# Patient Record
Sex: Female | Born: 1945 | Race: White | Hispanic: No | Marital: Married | Smoking: Former smoker
Health system: Southern US, Community
[De-identification: ages and names within clinical notes are randomized; demographics above are authoritative.]

## PROBLEM LIST (undated history)

## (undated) DIAGNOSIS — F341 Dysthymic disorder: Secondary | ICD-10-CM

## (undated) DIAGNOSIS — I4891 Unspecified atrial fibrillation: Secondary | ICD-10-CM

## (undated) DIAGNOSIS — K279 Peptic ulcer, site unspecified, unspecified as acute or chronic, without hemorrhage or perforation: Secondary | ICD-10-CM

## (undated) DIAGNOSIS — I2510 Atherosclerotic heart disease of native coronary artery without angina pectoris: Secondary | ICD-10-CM

## (undated) DIAGNOSIS — R0789 Other chest pain: Secondary | ICD-10-CM

## (undated) DIAGNOSIS — J309 Allergic rhinitis, unspecified: Secondary | ICD-10-CM

## (undated) DIAGNOSIS — C801 Malignant (primary) neoplasm, unspecified: Secondary | ICD-10-CM

## (undated) DIAGNOSIS — N259 Disorder resulting from impaired renal tubular function, unspecified: Secondary | ICD-10-CM

## (undated) DIAGNOSIS — I2789 Other specified pulmonary heart diseases: Secondary | ICD-10-CM

## (undated) DIAGNOSIS — E0590 Thyrotoxicosis, unspecified without thyrotoxic crisis or storm: Secondary | ICD-10-CM

## (undated) DIAGNOSIS — I10 Essential (primary) hypertension: Secondary | ICD-10-CM

## (undated) DIAGNOSIS — E7800 Pure hypercholesterolemia, unspecified: Secondary | ICD-10-CM

## (undated) DIAGNOSIS — K219 Gastro-esophageal reflux disease without esophagitis: Secondary | ICD-10-CM

## (undated) DIAGNOSIS — R0602 Shortness of breath: Secondary | ICD-10-CM

## (undated) DIAGNOSIS — I5032 Chronic diastolic (congestive) heart failure: Secondary | ICD-10-CM

## (undated) DIAGNOSIS — R9430 Abnormal result of cardiovascular function study, unspecified: Secondary | ICD-10-CM

## (undated) DIAGNOSIS — R05 Cough: Secondary | ICD-10-CM

## (undated) DIAGNOSIS — IMO0002 Reserved for concepts with insufficient information to code with codable children: Secondary | ICD-10-CM

## (undated) DIAGNOSIS — Z7901 Long term (current) use of anticoagulants: Secondary | ICD-10-CM

## (undated) DIAGNOSIS — I517 Cardiomegaly: Secondary | ICD-10-CM

## (undated) DIAGNOSIS — E669 Obesity, unspecified: Secondary | ICD-10-CM

## (undated) HISTORY — DX: Thyrotoxicosis, unspecified without thyrotoxic crisis or storm: E05.90

## (undated) HISTORY — DX: Atherosclerotic heart disease of native coronary artery without angina pectoris: I25.10

## (undated) HISTORY — DX: Peptic ulcer, site unspecified, unspecified as acute or chronic, without hemorrhage or perforation: K27.9

## (undated) HISTORY — DX: Disorder resulting from impaired renal tubular function, unspecified: N25.9

## (undated) HISTORY — DX: Other chest pain: R07.89

## (undated) HISTORY — DX: Unspecified atrial fibrillation: I48.91

## (undated) HISTORY — DX: Allergic rhinitis, unspecified: J30.9

## (undated) HISTORY — DX: Chronic diastolic (congestive) heart failure: I50.32

## (undated) HISTORY — DX: Abnormal result of cardiovascular function study, unspecified: R94.30

## (undated) HISTORY — DX: Long term (current) use of anticoagulants: Z79.01

## (undated) HISTORY — DX: Shortness of breath: R06.02

## (undated) HISTORY — DX: Pure hypercholesterolemia, unspecified: E78.00

## (undated) HISTORY — DX: Reserved for concepts with insufficient information to code with codable children: IMO0002

## (undated) HISTORY — DX: Gastro-esophageal reflux disease without esophagitis: K21.9

## (undated) HISTORY — DX: Obesity, unspecified: E66.9

## (undated) HISTORY — DX: Essential (primary) hypertension: I10

## (undated) HISTORY — DX: Cough: R05

## (undated) HISTORY — DX: Dysthymic disorder: F34.1

## (undated) HISTORY — DX: Cardiomegaly: I51.7

## (undated) HISTORY — DX: Other specified pulmonary heart diseases: I27.89

---
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
## 2001-07-09 HISTORY — PX: HERNIA REPAIR: SHX51

## 2002-07-09 HISTORY — PX: DILATION AND CURETTAGE OF UTERUS: SHX78

## 2004-06-07 ENCOUNTER — Ambulatory Visit: Payer: Self-pay | Admitting: Cardiology

## 2004-06-09 ENCOUNTER — Ambulatory Visit: Payer: Self-pay | Admitting: Cardiology

## 2004-06-09 ENCOUNTER — Ambulatory Visit (HOSPITAL_COMMUNITY): Admission: RE | Admit: 2004-06-09 | Discharge: 2004-06-09 | Payer: Self-pay | Admitting: Cardiology

## 2004-06-28 ENCOUNTER — Ambulatory Visit: Payer: Self-pay | Admitting: *Deleted

## 2004-08-02 ENCOUNTER — Ambulatory Visit: Payer: Self-pay | Admitting: Cardiology

## 2005-02-14 ENCOUNTER — Ambulatory Visit: Payer: Self-pay | Admitting: Cardiology

## 2007-09-24 ENCOUNTER — Ambulatory Visit: Payer: Self-pay | Admitting: Cardiology

## 2007-11-03 ENCOUNTER — Ambulatory Visit (HOSPITAL_COMMUNITY): Admission: RE | Admit: 2007-11-03 | Discharge: 2007-11-03 | Payer: Self-pay | Admitting: Cardiology

## 2007-11-03 ENCOUNTER — Ambulatory Visit: Payer: Self-pay | Admitting: Cardiology

## 2007-11-17 ENCOUNTER — Ambulatory Visit: Payer: Self-pay | Admitting: Cardiology

## 2008-03-31 ENCOUNTER — Ambulatory Visit: Payer: Self-pay | Admitting: Cardiology

## 2008-09-29 DIAGNOSIS — E0590 Thyrotoxicosis, unspecified without thyrotoxic crisis or storm: Secondary | ICD-10-CM

## 2008-09-29 DIAGNOSIS — I517 Cardiomegaly: Secondary | ICD-10-CM

## 2008-09-29 DIAGNOSIS — I4891 Unspecified atrial fibrillation: Secondary | ICD-10-CM

## 2008-09-29 DIAGNOSIS — F341 Dysthymic disorder: Secondary | ICD-10-CM

## 2008-09-29 DIAGNOSIS — E669 Obesity, unspecified: Secondary | ICD-10-CM

## 2008-09-29 DIAGNOSIS — K219 Gastro-esophageal reflux disease without esophagitis: Secondary | ICD-10-CM

## 2008-09-29 DIAGNOSIS — I10 Essential (primary) hypertension: Secondary | ICD-10-CM | POA: Insufficient documentation

## 2008-09-29 DIAGNOSIS — J309 Allergic rhinitis, unspecified: Secondary | ICD-10-CM

## 2008-09-29 DIAGNOSIS — N259 Disorder resulting from impaired renal tubular function, unspecified: Secondary | ICD-10-CM | POA: Insufficient documentation

## 2008-09-29 DIAGNOSIS — I2789 Other specified pulmonary heart diseases: Secondary | ICD-10-CM

## 2008-09-29 DIAGNOSIS — E7800 Pure hypercholesterolemia, unspecified: Secondary | ICD-10-CM

## 2008-09-29 DIAGNOSIS — K279 Peptic ulcer, site unspecified, unspecified as acute or chronic, without hemorrhage or perforation: Secondary | ICD-10-CM

## 2008-09-29 HISTORY — DX: Gastro-esophageal reflux disease without esophagitis: K21.9

## 2008-09-29 HISTORY — DX: Cardiomegaly: I51.7

## 2008-09-29 HISTORY — DX: Allergic rhinitis, unspecified: J30.9

## 2008-09-29 HISTORY — DX: Obesity, unspecified: E66.9

## 2008-09-29 HISTORY — DX: Dysthymic disorder: F34.1

## 2008-09-29 HISTORY — DX: Disorder resulting from impaired renal tubular function, unspecified: N25.9

## 2008-09-29 HISTORY — DX: Pure hypercholesterolemia, unspecified: E78.00

## 2008-09-29 HISTORY — DX: Peptic ulcer, site unspecified, unspecified as acute or chronic, without hemorrhage or perforation: K27.9

## 2008-09-29 HISTORY — DX: Essential (primary) hypertension: I10

## 2008-09-29 HISTORY — DX: Thyrotoxicosis, unspecified without thyrotoxic crisis or storm: E05.90

## 2008-09-29 HISTORY — DX: Other specified pulmonary heart diseases: I27.89

## 2008-09-29 HISTORY — DX: Unspecified atrial fibrillation: I48.91

## 2008-09-30 ENCOUNTER — Ambulatory Visit: Payer: Self-pay | Admitting: Cardiology

## 2008-09-30 ENCOUNTER — Encounter: Payer: Self-pay | Admitting: Cardiology

## 2008-10-20 ENCOUNTER — Encounter: Payer: Self-pay | Admitting: Cardiology

## 2008-10-20 ENCOUNTER — Ambulatory Visit: Payer: Self-pay | Admitting: Cardiology

## 2008-10-20 DIAGNOSIS — I2510 Atherosclerotic heart disease of native coronary artery without angina pectoris: Secondary | ICD-10-CM | POA: Insufficient documentation

## 2008-10-20 HISTORY — DX: Atherosclerotic heart disease of native coronary artery without angina pectoris: I25.10

## 2008-10-21 ENCOUNTER — Encounter: Payer: Self-pay | Admitting: Cardiology

## 2008-11-01 ENCOUNTER — Telehealth (INDEPENDENT_AMBULATORY_CARE_PROVIDER_SITE_OTHER): Payer: Self-pay | Admitting: Radiology

## 2008-11-02 ENCOUNTER — Encounter: Payer: Self-pay | Admitting: Cardiology

## 2008-11-02 ENCOUNTER — Ambulatory Visit: Payer: Self-pay

## 2008-11-16 ENCOUNTER — Encounter: Payer: Self-pay | Admitting: Cardiology

## 2008-11-19 ENCOUNTER — Ambulatory Visit: Payer: Self-pay | Admitting: Cardiology

## 2008-12-31 ENCOUNTER — Encounter: Payer: Self-pay | Admitting: Cardiology

## 2009-01-11 ENCOUNTER — Encounter: Payer: Self-pay | Admitting: Cardiology

## 2009-01-13 ENCOUNTER — Ambulatory Visit: Payer: Self-pay | Admitting: Cardiology

## 2009-01-17 ENCOUNTER — Encounter: Payer: Self-pay | Admitting: Cardiology

## 2009-01-21 ENCOUNTER — Encounter: Payer: Self-pay | Admitting: Cardiology

## 2009-01-25 ENCOUNTER — Ambulatory Visit (HOSPITAL_COMMUNITY): Admission: RE | Admit: 2009-01-25 | Discharge: 2009-01-25 | Payer: Self-pay | Admitting: Cardiology

## 2009-01-25 ENCOUNTER — Ambulatory Visit: Payer: Self-pay | Admitting: Cardiology

## 2009-01-26 ENCOUNTER — Encounter: Payer: Self-pay | Admitting: Cardiology

## 2009-02-04 ENCOUNTER — Telehealth: Payer: Self-pay | Admitting: Cardiology

## 2009-02-17 ENCOUNTER — Telehealth: Payer: Self-pay | Admitting: Cardiology

## 2009-02-21 ENCOUNTER — Encounter: Payer: Self-pay | Admitting: Cardiology

## 2009-03-09 ENCOUNTER — Ambulatory Visit: Payer: Self-pay | Admitting: Cardiology

## 2009-03-18 ENCOUNTER — Encounter: Payer: Self-pay | Admitting: Cardiology

## 2009-04-01 ENCOUNTER — Encounter: Payer: Self-pay | Admitting: Cardiology

## 2009-04-11 ENCOUNTER — Encounter (INDEPENDENT_AMBULATORY_CARE_PROVIDER_SITE_OTHER): Payer: Self-pay | Admitting: *Deleted

## 2009-04-11 ENCOUNTER — Encounter: Payer: Self-pay | Admitting: Cardiology

## 2009-04-12 ENCOUNTER — Ambulatory Visit: Payer: Self-pay | Admitting: Cardiology

## 2009-04-12 DIAGNOSIS — R059 Cough, unspecified: Secondary | ICD-10-CM | POA: Insufficient documentation

## 2009-04-12 DIAGNOSIS — R05 Cough: Secondary | ICD-10-CM

## 2009-04-12 HISTORY — DX: Cough, unspecified: R05.9

## 2009-04-12 LAB — CONVERTED CEMR LAB
BUN: 19 mg/dL (ref 6–23)
Basophils Absolute: 0 10*3/uL (ref 0.0–0.1)
Basophils Relative: 0.1 % (ref 0.0–3.0)
CO2: 32 meq/L (ref 19–32)
Calcium: 9.3 mg/dL (ref 8.4–10.5)
Chloride: 104 meq/L (ref 96–112)
Creatinine, Ser: 1.8 mg/dL — ABNORMAL HIGH (ref 0.4–1.2)
Eosinophils Absolute: 0.1 10*3/uL (ref 0.0–0.7)
Eosinophils Relative: 1.9 % (ref 0.0–5.0)
GFR calc non Af Amer: 29.99 mL/min (ref >60)
Glucose, Bld: 103 mg/dL — ABNORMAL HIGH (ref 70–99)
HCT: 42.4 % (ref 36.0–46.0)
Hemoglobin: 14.2 g/dL (ref 12.0–15.0)
INR: 2 — ABNORMAL HIGH (ref 0.8–1.0)
Lymphocytes Relative: 15.2 % (ref 12.0–46.0)
Lymphs Abs: 0.9 10*3/uL (ref 0.7–4.0)
MCHC: 33.3 g/dL (ref 30.0–36.0)
MCV: 92.9 fL (ref 78.0–100.0)
Monocytes Absolute: 0.5 10*3/uL (ref 0.1–1.0)
Monocytes Relative: 8.3 % (ref 3.0–12.0)
Neutro Abs: 4.4 10*3/uL (ref 1.4–7.7)
Neutrophils Relative %: 74.5 % (ref 43.0–77.0)
Platelets: 166 10*3/uL (ref 150.0–400.0)
Potassium: 3.8 meq/L (ref 3.5–5.1)
Prothrombin Time: 20.6 s — ABNORMAL HIGH (ref 9.1–11.7)
RBC: 4.57 M/uL (ref 3.87–5.11)
RDW: 16 % — ABNORMAL HIGH (ref 11.5–14.6)
Sodium: 142 meq/L (ref 135–145)
WBC: 5.9 10*3/uL (ref 4.5–10.5)

## 2009-04-14 ENCOUNTER — Ambulatory Visit (HOSPITAL_COMMUNITY): Admission: RE | Admit: 2009-04-14 | Discharge: 2009-04-14 | Payer: Self-pay | Admitting: Internal Medicine

## 2009-04-14 ENCOUNTER — Ambulatory Visit: Payer: Self-pay | Admitting: Cardiology

## 2009-04-19 ENCOUNTER — Encounter: Payer: Self-pay | Admitting: Cardiology

## 2009-05-23 ENCOUNTER — Telehealth: Payer: Self-pay | Admitting: Cardiology

## 2009-05-29 ENCOUNTER — Encounter: Payer: Self-pay | Admitting: Cardiology

## 2009-05-30 ENCOUNTER — Ambulatory Visit: Payer: Self-pay | Admitting: Cardiology

## 2009-05-30 ENCOUNTER — Ambulatory Visit: Payer: Self-pay | Admitting: Internal Medicine

## 2009-05-30 DIAGNOSIS — R0789 Other chest pain: Secondary | ICD-10-CM

## 2009-05-30 HISTORY — DX: Other chest pain: R07.89

## 2009-05-31 ENCOUNTER — Encounter: Payer: Self-pay | Admitting: Cardiology

## 2009-06-01 ENCOUNTER — Ambulatory Visit: Payer: Self-pay | Admitting: Cardiology

## 2009-06-01 ENCOUNTER — Ambulatory Visit: Payer: Self-pay | Admitting: Internal Medicine

## 2009-06-07 LAB — CONVERTED CEMR LAB
Basophils Absolute: 0 10*3/uL (ref 0.0–0.1)
Basophils Relative: 0.1 % (ref 0.0–3.0)
Eosinophils Absolute: 0.1 10*3/uL (ref 0.0–0.7)
Eosinophils Relative: 1.6 % (ref 0.0–5.0)
HCT: 43.2 % (ref 36.0–46.0)
Hemoglobin: 14.2 g/dL (ref 12.0–15.0)
Lymphocytes Relative: 10.7 % — ABNORMAL LOW (ref 12.0–46.0)
Lymphs Abs: 0.7 10*3/uL (ref 0.7–4.0)
MCHC: 33 g/dL (ref 30.0–36.0)
MCV: 93.6 fL (ref 78.0–100.0)
Monocytes Absolute: 0.4 10*3/uL (ref 0.1–1.0)
Monocytes Relative: 5.9 % (ref 3.0–12.0)
Neutro Abs: 5.2 10*3/uL (ref 1.4–7.7)
Neutrophils Relative %: 81.7 % — ABNORMAL HIGH (ref 43.0–77.0)
Platelets: 126 10*3/uL — ABNORMAL LOW (ref 150.0–400.0)
RDW: 16.2 % — ABNORMAL HIGH (ref 11.5–14.6)
Sed Rate: 19 mm/hr (ref 0–22)
WBC: 6.4 10*3/uL (ref 4.5–10.5)

## 2009-06-19 ENCOUNTER — Encounter: Payer: Self-pay | Admitting: Cardiology

## 2009-06-20 ENCOUNTER — Ambulatory Visit: Payer: Self-pay | Admitting: Internal Medicine

## 2009-06-20 ENCOUNTER — Ambulatory Visit: Payer: Self-pay | Admitting: Cardiology

## 2009-06-21 ENCOUNTER — Telehealth: Payer: Self-pay | Admitting: Cardiology

## 2009-06-22 ENCOUNTER — Encounter: Payer: Self-pay | Admitting: Cardiology

## 2009-06-22 ENCOUNTER — Telehealth: Payer: Self-pay | Admitting: Cardiology

## 2009-08-04 ENCOUNTER — Ambulatory Visit: Payer: Self-pay | Admitting: Cardiology

## 2009-08-04 ENCOUNTER — Ambulatory Visit: Payer: Self-pay | Admitting: Internal Medicine

## 2009-09-13 ENCOUNTER — Encounter: Payer: Self-pay | Admitting: Cardiology

## 2009-10-11 ENCOUNTER — Ambulatory Visit: Payer: Self-pay | Admitting: Internal Medicine

## 2009-10-11 LAB — CONVERTED CEMR LAB
BUN: 24 mg/dL — ABNORMAL HIGH (ref 6–23)
Basophils Absolute: 0 10*3/uL (ref 0.0–0.1)
Basophils Relative: 0.8 % (ref 0.0–3.0)
CO2: 27 meq/L (ref 19–32)
Calcium: 9.1 mg/dL (ref 8.4–10.5)
Chloride: 107 meq/L (ref 96–112)
Creatinine, Ser: 1.5 mg/dL — ABNORMAL HIGH (ref 0.4–1.2)
Eosinophils Absolute: 0.1 10*3/uL (ref 0.0–0.7)
GFR calc non Af Amer: 36.96 mL/min (ref >60)
Glucose, Bld: 111 mg/dL — ABNORMAL HIGH (ref 70–99)
HCT: 44.7 % (ref 36.0–46.0)
Hemoglobin: 15.2 g/dL — ABNORMAL HIGH (ref 12.0–15.0)
Lymphocytes Relative: 14.1 % (ref 12.0–46.0)
Lymphs Abs: 0.8 10*3/uL (ref 0.7–4.0)
MCHC: 34 g/dL (ref 30.0–36.0)
MCV: 89.6 fL (ref 78.0–100.0)
Monocytes Relative: 6.9 % (ref 3.0–12.0)
Neutro Abs: 4.6 10*3/uL (ref 1.4–7.7)
Neutrophils Relative %: 76.3 % (ref 43.0–77.0)
Platelets: 164 10*3/uL (ref 150.0–400.0)
Potassium: 4.3 meq/L (ref 3.5–5.1)
Pro B Natriuretic peptide (BNP): 286 pg/mL — ABNORMAL HIGH (ref 0.0–100.0)
RBC: 4.99 M/uL (ref 3.87–5.11)
RDW: 17.4 % — ABNORMAL HIGH (ref 11.5–14.6)
Sodium: 143 meq/L (ref 135–145)
TSH: 1.94 microintl units/mL (ref 0.35–5.50)
WBC: 6 10*3/uL (ref 4.5–10.5)

## 2009-10-24 ENCOUNTER — Ambulatory Visit: Payer: Self-pay | Admitting: Cardiology

## 2009-10-24 ENCOUNTER — Ambulatory Visit: Payer: Self-pay | Admitting: Internal Medicine

## 2010-01-26 ENCOUNTER — Ambulatory Visit: Payer: Self-pay | Admitting: Cardiology

## 2010-02-21 ENCOUNTER — Ambulatory Visit: Payer: Self-pay | Admitting: Cardiology

## 2010-02-23 LAB — CONVERTED CEMR LAB
BUN: 41 mg/dL — ABNORMAL HIGH (ref 6–23)
CO2: 30 meq/L (ref 19–32)
Calcium: 9.7 mg/dL (ref 8.4–10.5)
Chloride: 97 meq/L (ref 96–112)
Creatinine, Ser: 1.8 mg/dL — ABNORMAL HIGH (ref 0.4–1.2)
Potassium: 3.5 meq/L (ref 3.5–5.1)
Sodium: 141 meq/L (ref 135–145)

## 2010-03-01 ENCOUNTER — Telehealth: Payer: Self-pay | Admitting: Cardiology

## 2010-03-09 ENCOUNTER — Encounter: Payer: Self-pay | Admitting: Cardiology

## 2010-04-20 ENCOUNTER — Ambulatory Visit: Payer: Self-pay | Admitting: Cardiology

## 2010-08-06 LAB — CONVERTED CEMR LAB
BUN: 18 mg/dL (ref 6–23)
BUN: 25 mg/dL — ABNORMAL HIGH (ref 6–23)
CO2: 30 meq/L (ref 19–32)
CO2: 31 meq/L (ref 19–32)
Calcium: 9.3 mg/dL (ref 8.4–10.5)
Calcium: 9.5 mg/dL (ref 8.4–10.5)
Chloride: 105 meq/L (ref 96–112)
Chloride: 106 meq/L (ref 96–112)
Creatinine, Ser: 1.3 mg/dL — ABNORMAL HIGH (ref 0.4–1.2)
Creatinine, Ser: 1.5 mg/dL — ABNORMAL HIGH (ref 0.4–1.2)
GFR calc non Af Amer: 37 mL/min (ref >60)
GFR calc non Af Amer: 43.55 mL/min (ref >60)
Glucose, Bld: 87 mg/dL (ref 70–99)
Glucose, Bld: 97 mg/dL (ref 70–99)
Potassium: 3.4 meq/L — ABNORMAL LOW (ref 3.5–5.1)
Potassium: 4.1 meq/L (ref 3.5–5.1)
Sodium: 142 meq/L (ref 135–145)
Sodium: 145 meq/L (ref 135–145)

## 2010-08-08 NOTE — Assessment & Plan Note (Signed)
Summary: Pulmonary/ f/u sob   Copy to:  Francella Solian, MD Primary Provider/Referring Provider:  Adele Barthel, MD  CC:  3 month followup.  Pt states that she was advised to followup here per Dr Myrtis Ser.  She had cxr 08/04/09 for followup and states she was told there were some changes.  She states that her breathing is the same since last seen here.  No new complaints today.Marland Kitchen  History of Present Illness: 1 yowf quit smoking around 1992 with no respiratory problems then.  June 01, 2009 cc new onset sob starting September attributed to afib but after back in rhythm no better breathing = doe x room to room, assoc with congested cough and purulent nasal secretions and hemoptysis but no epistaxis.   Amiodarone stopped 05/31/2009.  Albuterol needed up to 6 x daily rec Change prilosec to Take  one 30-60 min before first meal of the day  Prednsione 4 each am x 2days, 2x2days, 1x2days and stop  Augmentin 875 mg twice daily twice x 10 days and hold coumadin whenever nasal secretions Stop labetalol and replace it with bystolic 10 mg once daily for cough mucinex dm per bottle Pepcid 20 mg one at bedtime  June 20, 2009 2 wk followup. Reaction to augmentin, changed to keflex and resolved.  Pt states that her breathing has improved.  rec  changed bystolic to bisoprolol and added amlodipine.    October 11, 2009 3 month followup.  Pt states that she was advised to followup here per Dr Myrtis Ser.  She had cxr 08/04/09 for followup and states she was told there were some changes.  She states that her breathing is the same since last seen here.  No new complaints today. cc doe x one flight x 4-5 years no recent change, been that way ever since moved into present house, no sign cough.  baseline wt 235. Pt denies any significant sore throat, dysphagia, itching, sneezing,  nasal congestion or excess secretions,  fever, chills, sweats, unintended wt loss, pleuritic or exertional cp, hempoptysis, change in activity tolerance   orthopnea pnd or leg swelling.  Pt also denies any obvious fluctuation in symptoms with weather or environmental change or other alleviating or aggravating factors.       Current Medications (verified): 1)  Warfarin Sodium 5 Mg Tabs (Warfarin Sodium) .... Use As Directed By Anticoagulation Clinic 2)  Furosemide 80 Mg Tabs (Furosemide) .Marland Kitchen.. 1 Two Times A Day 3)  Synthroid 150 Mcg Tabs (Levothyroxine Sodium) .... Once Daily 4)  Prilosec Otc 20 Mg Tbec (Omeprazole Magnesium) .... Take  One 30-60 Min Before First Meal of The Day 5)  Ventolin Hfa 108 (90 Base) Mcg/act Aers (Albuterol Sulfate) .... Hold 6)  Allopurinol 300 Mg Tabs (Allopurinol) .Marland Kitchen.. 1 Once Daily 7)  Systane 0.4-0.3 % Soln (Polyethyl Glycol-Propyl Glycol) .... Uad 8)  Lorazepam 0.5 Mg Tabs (Lorazepam) .... At Bedtime 9)  Potassium Chloride Crys Cr 20 Meq Cr-Tabs (Potassium Chloride Crys Cr) .... Take One Tablet By Mouth Daily 10)  Bisoprolol Fumarate 5 Mg Tabs (Bisoprolol Fumarate) .... One Daily  Allergies (verified): 1)  ! Asa 2)  ! Percocet 3)  ! Darvocet 4)  ! Darvon 5)  ! Penicillin 6)  ! Lipitor (Atorvastatin) 7)  ! * Prevalite 8)  ! Prevacid 9)  ! Clonidine Hcl  Past History:  Past Medical History: ATRIAL FIBRILLATION (ICD-427.31)..paroxysmal. / .cardioversion...after 7 weeks amiodarone 200 mg daily January 26, 2009... successful  / slow heart rate at home.. verapamil  lower / clinically returned to atrial fib late August, 2010  /   cardioversion 04/14/2009 on more complete amio load at 400mg /day. successful /  amiodarone stopped for pulmonary illness November, 2010.  The patient improved.  Amiodarone not restarted.  /  rhythm regular August 04, 2009 RENAL INSUFFICIENCY (ICD-588.9) COUMADIN THERAPY (ICD-V58.61) VENTRICULAR HYPERTROPHY, LEFT (ICD-429.3) PULMONARY HYPERTENSION (ICD-416.8) ALLERGIC RHINITIS (ICD-477.9) HYPERCHOLESTEROLEMIA (ICD-272.0) ANXIETY DEPRESSION (ICD-300.4) OBESITY (ICD-278.00) GERD  (ICD-530.81) HYPERTENSION (ICD-401.9) PEPTIC ULCER DISEASE (ICD-533.90) THYROTOXICOSIS (ICD-242.90) Hyperlipidemia Volume overload Normal systolic left ventricle function Moderate left ventricular hypertrophy Coumadin therapy Prior amiodarone therapy > stopped  June 01, 2009 .  the medication may have been responsible for pulmonary illness November, 2011.  We cannot be sure, but amiodarone will not be restarted.  Renal insufficiency creatinine 1.9 Coronary artery disease status [facility] with a 70% circumflex lesion.  /   Myoview scan April, 2010... not gated because of atrial fib.... no ischemia Cough....lisinopril held...04/12/2009 >    cough resolved Chest discomfort.....04/12/2009 /// GI  ??? Pulmonary illness....05/2009...abnormal CT....ESR not elevated.......Marland KitchenWert     - no desat walking October 11, 2009 but only able to do 185 ft  Vital Signs:  Patient profile:   65 year old female Weight:      250 pounds O2 Sat:      95 % on Room air Temp:     97.8 degrees F oral Pulse rate:   76 / minute BP sitting:   156 / 90  (left arm) Cuff size:   large  Vitals Entered By: Vernie Murders (October 11, 2009 10:21 AM)  O2 Flow:  Room air  Serial Vital Signs/Assessments:  Comments: 10:49 AM Ambulatory Pulse Oximetry  Resting; HR__70___    02 Sat__95%ra___  Lap1 (185 feet)   HR__90___   02 Sat__91%ra___ Lap2 (185 feet)   HR_____   02 Sat_____    Lap3 (185 feet)   HR_____   02 Sat_____  ___Test Completed without Difficulty _x__Test Stopped due to:pt SOB   By: Vernie Murders    Physical Exam  Additional Exam:  wt 242 > 256 June 01, 2009 > 237 June 20, 2009 > 250 October 11, 2009  HEENT: nl dentition, turbinates, and orophanx. Nl external ear canals without cough reflex NECK :  without JVD/Nodes/TM/ nl carotid upstrokes bilaterally LUNGS: no acc muscle use,  completely clear bilaterally  CV:  RRR  no s3 or murmur or increase in P2, no edema  ABD:   soft and nontender with nl excursion in the supine position. No bruits or organomegaly, bowel sounds nl MS:  warm without deformities, calf tenderness, cyanosis or clubbing  FastTSH                   1.94 uIU/mL                 0.35-5.50  Tests: (2) BMP (METABOL)   Sodium                    143 mEq/L                   135-145   Potassium                 4.3 mEq/L                   3.5-5.1   Chloride  107 mEq/L                   96-112   Carbon Dioxide            27 mEq/L                    19-32   Glucose              [H]  111 mg/dL                   29-56   BUN                  [H]  24 mg/dL                    2-13   Creatinine           [H]  1.5 mg/dL                   0.8-6.5   Calcium                   9.1 mg/dL                   7.8-46.9   GFR                       36.96 mL/min                >60  Tests: (3) CBC Platelet w/Diff (CBCD)   White Cell Count          6.0 K/uL                    4.5-10.5   Red Cell Count            4.99 Mil/uL                 3.87-5.11   Hemoglobin           [H]  15.2 g/dL                   62.9-52.8   Hematocrit                44.7 %                      36.0-46.0   MCV                       89.6 fl                     78.0-100.0   MCHC                      34.0 g/dL                   41.3-24.4   RDW                  [H]  17.4 %                      11.5-14.6   Platelet Count            164.0 K/uL                  150.0-400.0   Neutrophil %  76.3 %                      43.0-77.0   Lymphocyte %              14.1 %                      12.0-46.0   Monocyte %                6.9 %                       3.0-12.0   Eosinophils%              1.9 %                       0.0-5.0   Basophils %               0.8 %                       0.0-3.0   Neutrophill Absolute      4.6 K/uL                    1.4-7.7   Lymphocyte Absolute       0.8 K/uL                    0.7-4.0   Monocyte Absolute         0.4 K/uL                    0.1-1.0   Eosinophils, Absolute                             0.1 K/uL                    0.0-0.7   Basophils Absolute        0.0 K/uL                    0.0-0.1  Tests: (4) B-Type Natiuretic Peptide (BNPR)  B-Type Natriuetic Peptide                        [H]  286.0 pg/mL                 0.0-100.0  Tests: (5) Sed Rate (ESR)   Sed Rate                  9 mm/hr                     0-22  CXR  Procedure date:  10/11/2009  Findings:      no def progression of ild  Impression & Recommendations:  Problem # 1:  ABNORMAL CHEST XRAY (ICD-793.1) Overall cxr pattern is not much different overall on or off amiodarone and no desat walking though sob ? reason after only one lap  Rec:  f/u pft's   Other Orders: TLB-TSH (Thyroid Stimulating Hormone) (84443-TSH) TLB-BMP (Basic Metabolic Panel-BMET) (80048-METABOL) TLB-CBC Platelet - w/Differential (85025-CBCD) TLB-BNP (B-Natriuretic Peptide) (83880-BNPR) TLB-Sedimentation Rate (ESR) (85652-ESR) T-2 View CXR (71020TC) Pulse Oximetry, Ambulatory (16109) Est. Patient Level III (60454)  Patient Instructions: 1)  Weight control is simply a matter of calorie balance which needs to  be tilted in your favor by eating less and exercising more.  To get the most out of exercise, you need to be continuously aware that you are short of breath, but never out of breath, for 30 minutes daily. As you improve, it will actually be easier for you to do the same amount in  30 minutes so always push to the level where you are short of breath.  If this does not result in gradual weight reduction,  I recommend  a nutritionist for a food diary 2)  Schedule PFT's for April 18th or next trip to [facility]

## 2010-08-08 NOTE — Assessment & Plan Note (Signed)
Summary: 3 month rov/sl   Visit Type:  Follow-up Referring Provider:  Francella Solian, MD Primary Provider:  Adele Barthel, MD  CC:  atrial fibrillation.  History of Present Illness: The patient is seen back for followup of coronary disease and atrial fibrillation.  She has an abnormal chest x-ray.  She saw Dr.Wert for pulmonary followup October 11, 2009 he is arranged for pulmonary function studies that will be done today.  She has some shortness of breath walking upstairs.  She is not bothered by a marked palpitations at this point.  Current Medications (verified): 1)  Warfarin Sodium 5 Mg Tabs (Warfarin Sodium) .... Use As Directed By Anticoagulation Clinic 2)  Furosemide 80 Mg Tabs (Furosemide) .Marland Kitchen.. 1 Two Times A Day 3)  Synthroid 150 Mcg Tabs (Levothyroxine Sodium) .... Once Daily 4)  Prilosec Otc 20 Mg Tbec (Omeprazole Magnesium) .... Take  One 30-60 Min Before First Meal of The Day 5)  Ventolin Hfa 108 (90 Base) Mcg/act Aers (Albuterol Sulfate) .... As Needed 6)  Allopurinol 300 Mg Tabs (Allopurinol) .Marland Kitchen.. 1 Once Daily 7)  Systane 0.4-0.3 % Soln (Polyethyl Glycol-Propyl Glycol) .... Uad 8)  Lorazepam 0.5 Mg Tabs (Lorazepam) .... At Bedtime 9)  Potassium Chloride Crys Cr 20 Meq Cr-Tabs (Potassium Chloride Crys Cr) .... Take One Tablet By Mouth Daily 10)  Bisoprolol Fumarate 5 Mg Tabs (Bisoprolol Fumarate) .... One Daily  Allergies (verified): 1)  ! Asa 2)  ! Percocet 3)  ! Darvocet 4)  ! Darvon 5)  ! Penicillin 6)  ! Lipitor (Atorvastatin) 7)  ! * Prevalite 8)  ! Prevacid 9)  ! Clonidine Hcl  Past History:  Past Medical History: ATRIAL FIBRILLATION (ICD-427.31)..paroxysmal. / .cardioversion...after 7 weeks amiodarone 200 mg daily January 26, 2009... successful  / slow heart rate at home.. verapamil lower / clinically returned to atrial fib late August, 2010  /   cardioversion 04/14/2009 on more complete amio load at 400mg /day. successful /  amiodarone stopped for pulmonary illness  November, 2010.  The patient improved.  Amiodarone not restarted.  /  rhythm regular August 04, 2009 RENAL INSUFFICIENCY (ICD-588.9) COUMADIN THERAPY (ICD-V58.61) VENTRICULAR HYPERTROPHY, LEFT (ICD-429.3) PULMONARY HYPERTENSION (ICD-416.8) ALLERGIC RHINITIS (ICD-477.9) HYPERCHOLESTEROLEMIA (ICD-272.0) ANXIETY DEPRESSION (ICD-300.4) OBESITY (ICD-278.00) GERD (ICD-530.81) HYPERTENSION (ICD-401.9) PEPTIC ULCER DISEASE (ICD-533.90) THYROTOXICOSIS (ICD-242.90) Hyperlipidemia Volume overload Normal systolic left ventricle function Moderate left ventricular hypertrophy Coumadin therapy Prior amiodarone therapy > stopped  June 01, 2009 .  the medication may have been responsible for pulmonary illness November, 2011.  We cannot be sure, but amiodarone will not be restarted.  Renal insufficiency creatinine 1.9 Coronary artery disease status post catheter at [facility] in 2001 with a 70% circumflex lesion.  /   Myoview scan April, 2010... not gated because of atrial fib.... no ischemia Cough....lisinopril held...04/12/2009 >    cough resolved Chest discomfort.....04/12/2009 /// GI  ??? Pulmonary illness....05/2009...abnormal CT....ESR not elevated.......Marland KitchenWert     - no desat walking October 11, 2009 but only able to do 185 ft No echo data as of October 24, 2009  Review of Systems       Patient denies fever, chills, headache, sweats, rash, change in vision, change in hearing, chest pain, nausea vomiting, urinary symptoms.  All other systems are reviewed and are negative.    Vital Signs:  Patient profile:   65 year old female Height:      67 inches Weight:      246 pounds BMI:     38.67 Pulse rate:  70 / minute BP sitting:   144 / 96  (left arm) Cuff size:   regular  Vitals Entered By: Hardin Negus, RMA (October 24, 2009 10:34 AM)  Physical Exam  General:  Patient is stable today. Eyes:  no xanthelasma. Neck:  no jugular venous distention. Lungs:  lungs are clear.  Respiratory effort  is nonlabored. Heart:  cardiac exam reveals an S1-S2.  There are no clicks or significant murmurs. herThe rhythm is irregularly irregular Abdomen:  abdomen is soft. Extremities:  no peripheral edema. Psych:  patient is oriented to person time and place.  Affect is normal.   Impression & Recommendations:  Problem # 1:  * PULMONARY ILLNESS  05/2009 The patient is scheduled to have pulmonary function studies today.  This data will be reviewed by Dr.Wert.  I will be in touch with him to discuss any further approaches.  Her chest x-ray in April had shown no new progression of her pulmonary findings.  Problem # 2:  CHEST DISCOMFORT (ICD-786.59)  Her updated medication list for this problem includes:    Warfarin Sodium 5 Mg Tabs (Warfarin sodium) ..... Use as directed by anticoagulation clinic    Bisoprolol Fumarate 5 Mg Tabs (Bisoprolol fumarate) ..... One daily Patient is not having any chest pain.  No further workup.  Problem # 3:  FLUID OVERLOAD (ICD-276.6) Volume status is stable.  No change in therapy.  Problem # 4:  ATRIAL FIBRILLATION (ICD-427.31)  Her updated medication list for this problem includes:    Warfarin Sodium 5 Mg Tabs (Warfarin sodium) ..... Use as directed by anticoagulation clinic    Bisoprolol Fumarate 5 Mg Tabs (Bisoprolol fumarate) ..... One daily On physical examination today her rhythm is irregularly irregular.  The rate is controlled.  I suspect she may be back in atrial fib.  I feel that we should watch this clinically and with rate control.  She is on Coumadin.  Patient Instructions: 1)  Follow up in 3 months

## 2010-08-08 NOTE — Assessment & Plan Note (Signed)
Summary: per check out/sf   Visit Type:  Follow-up Referring Provider:  Francella Solian, MD Primary Provider:  Adele Barthel, MD   History of Present Illness: The patient is seen for followup of atrial fibrillation,, renal insufficiency, Coumadin therapy, volume overload, coronary artery disease, pulmonary illness in November 2 010, amiodarone therapy.  When I saw her last on June 20, 2009 her pulmonary illness was significantly improved.  We had stopped amiodarone prior to that time.  Later that day she saw Dr.Wert for pulmonary followup.  He agreed that she seemed to be much better.  Her sedimentation rate had not been elevated and this spoke against amiodarone being the culprit.  However, we cannot know this for sure an amnio was not restarted.  She remains off amiodarone and I will not use it again.  Since her last visit she has had an upper respiratory infection which has been successfully treated.  She actually is feeling well.  She has not had significant palpitations.   Current Medications (verified): 1)  Warfarin Sodium 5 Mg Tabs (Warfarin Sodium) .... Use As Directed By Anticoagulation Clinic 2)  Furosemide 80 Mg Tabs (Furosemide) .Marland Kitchen.. 1 Two Times A Day 3)  Synthroid 150 Mcg Tabs (Levothyroxine Sodium) .... Once Daily 4)  Prilosec Otc 20 Mg Tbec (Omeprazole Magnesium) .... Take  One 30-60 Min Before First Meal of The Day 5)  Ventolin Hfa 108 (90 Base) Mcg/act Aers (Albuterol Sulfate) .... Hold 6)  Allopurinol 300 Mg Tabs (Allopurinol) .Marland Kitchen.. 1 Once Daily 7)  Systane 0.4-0.3 % Soln (Polyethyl Glycol-Propyl Glycol) .... Uad 8)  Lorazepam 0.5 Mg Tabs (Lorazepam) .... At Bedtime 9)  Potassium Chloride Crys Cr 20 Meq Cr-Tabs (Potassium Chloride Crys Cr) .... Take One Tablet By Mouth Daily 10)  Bisoprolol Fumarate 5 Mg Tabs (Bisoprolol Fumarate) .... One Daily  Allergies (verified): 1)  ! Asa 2)  ! Percocet 3)  ! Darvocet 4)  ! Darvon 5)  ! Penicillin 6)  ! Lipitor (Atorvastatin) 7)  !  * Prevalite 8)  ! Prevacid 9)  ! Clonidine Hcl  Past History:  Past Medical History: ATRIAL FIBRILLATION (ICD-427.31)..paroxysmal. / .cardioversion...after 7 weeks amiodarone 200 mg daily January 26, 2009... successful  / slow heart rate at home.. verapamil lower / clinically returned to atrial fib late August, 2010  /   cardioversion 04/14/2009 on more complete amio load at 400mg /day. successful /  amiodarone stopped for pulmonary illness November, 2010.  The patient improved.  Amiodarone not restarted.  /  rhythm regular August 04, 2009 RENAL INSUFFICIENCY (ICD-588.9) COUMADIN THERAPY (ICD-V58.61) VENTRICULAR HYPERTROPHY, LEFT (ICD-429.3) PULMONARY HYPERTENSION (ICD-416.8) ALLERGIC RHINITIS (ICD-477.9) HYPERCHOLESTEROLEMIA (ICD-272.0) ANXIETY DEPRESSION (ICD-300.4) OBESITY (ICD-278.00) GERD (ICD-530.81) HYPERTENSION (ICD-401.9) PEPTIC ULCER DISEASE (ICD-533.90) THYROTOXICOSIS (ICD-242.90) Hyperlipidemia Volume overload Normal systolic left ventricle function Moderate left ventricular hypertrophy Coumadin therapy Prior amiodarone therapy > stopped  June 01, 2009 .  the medication may have been responsible for pulmonary illness November, 2011.  We cannot be sure, but amiodarone will not be restarted.  Renal insufficiency creatinine 1.9 Coronary artery disease status post catheter at [facility] in 2001 with a 70% circumflex lesion.  /   Myoview scan April, 2010... not gated because of atrial fib.... no ischemia Cough....lisinopril held...04/12/2009 >  helped cough Chest discomfort.....04/12/2009 /// GI  ??? Pulmonary illness....05/2009...abnormal CT....ESR not elevated.Marland KitchenMarland KitchenSeeing DR. Wert     - All pulmonary symptoms resolved June 20, 2009 > see ov (PPI and hs h2) / amiodarone may have played a rol  Review of Systems  Patient denies fever, chills, headache, sweats, rash, change in vision, change in hearing, chest pain, cough, shortness of breath, nausea or vomiting, urinary  symptoms.  All the systems are reviewed and are negative.  Vital Signs:  Patient profile:   65 year old female Height:      67 inches Weight:      237 pounds BMI:     37.25 Pulse rate:   68 / minute BP sitting:   138 / 62  (left arm) Cuff size:   regular  Vitals Entered By: Hardin Negus, RMA (August 04, 2009 10:41 AM)  Physical Exam  General:  patient is stable in general today. Eyes:  no xanthelasma. Neck:  no jugular venous distention. Lungs:  lungs are clear.  Respiratory effort is nonlabored. Heart:  cardiac exam reveals S1-S2.  No clicks or significant murmurs. Abdomen:  abdomen is soft. Extremities:  no peripheral edema. Psych:  patient is oriented to person time and place.  Affect is normal.   Impression & Recommendations:  Problem # 1:  BRADYCARDIA (ICD-427.89) Heart rate is relatively slow today but the patient is stable.  No change in therapy.  Problem # 2:  * PULMONARY ILLNESS  05/2009 The patient is significantly improved.  Amiodarone may have played a role in this illness.  Amiodarone will not be restarted.  Chest x-ray is ordered to be sure that she has had complete resolution of all of her pulmonary findings.  Problem # 3:  ATRIAL FIBRILLATION (ICD-427.31) Rhythm is stable today.  It is regular on physical exam.  I have not done an EKG.  Coumadin is being used.  Over time if we have recurrent rhythm problems they will have to be approached without the use of amiodarone.  Other Orders: T-2 View CXR (71020TC)  Patient Instructions: 1)  A chest x-ray takes a picture of the organs and structures inside the chest, including the heart, lungs, and blood vessels. This test can show several things, including, whether the heart is enlarged; whether fluid is building up in the lungs; and whether pacemaker / defibrillator leads are still in place. 2)  Follow up in 3 months

## 2010-08-08 NOTE — Miscellaneous (Signed)
  Clinical Lists Changes  Observations: Added new observation of CARDIO HPI: I was in touch with Dr.Wert about the patient's chest x-ray in January, 2011.  Because there was question of some progression of the interstitial changes, he would like to see her in followup.  I will arrange this. (09/13/2009 10:15) Added new observation of REFERRING MD: Francella Solian, MD (09/13/2009 10:15) Added new observation of PRIMARY MD: Adele Barthel, MD (09/13/2009 10:15)      Referring Provider:  Francella Solian, MD Primary Provider:  Adele Barthel, MD   History of Present Illness: I was in touch with Dr.Wert about the patient's chest x-ray in January, 2011.  Because there was question of some progression of the interstitial changes, he would like to see her in followup.  I will arrange this.  Appended Document:  pt is aware, she will call and sch f/u w/Dr Sherene Sires

## 2010-08-08 NOTE — Assessment & Plan Note (Signed)
Summary: 10:45  f56m   Visit Type:  Follow-up Referring Provider:  Francella Solian, MD Primary Provider:  Adele Barthel, MD  CC:  shortness of breath.  History of Present Illness: The patient is seen for followup of shortness of breath.  I saw her last January 26, 2010.  At that time a chest x-ray was done.  The study showed chronic interstitial lung changes.  There is question however that there might be superimposed fluid.  At the time of that visit I had decided to add Zaroxolyn 3 days a week to her meds.  She has been taking this and in fact she has diuresed.  I think she has lost a few pounds of body weight also.  She definitely feels better.  She is able to walk up stairs if she goes slowly.  I feel the volume was part of the issue.  Current Medications (verified): 1)  Warfarin Sodium 5 Mg Tabs (Warfarin Sodium) .... Use As Directed By Anticoagulation Clinic 2)  Furosemide 80 Mg Tabs (Furosemide) .... Take One Tablet By Mouth Daily. 3)  Synthroid 150 Mcg Tabs (Levothyroxine Sodium) .... Once Daily 4)  Prilosec Otc 20 Mg Tbec (Omeprazole Magnesium) .... Take  One 30-60 Min Before First Meal of The Day 5)  Ventolin Hfa 108 (90 Base) Mcg/act Aers (Albuterol Sulfate) .... As Needed 6)  Allopurinol 300 Mg Tabs (Allopurinol) .Marland Kitchen.. 1 Once Daily 7)  Systane 0.4-0.3 % Soln (Polyethyl Glycol-Propyl Glycol) .... Uad 8)  Lorazepam 0.5 Mg Tabs (Lorazepam) .... At Bedtime 9)  Potassium Chloride Crys Cr 20 Meq Cr-Tabs (Potassium Chloride Crys Cr) .... Take One Tablet By Mouth Daily 10)  Bisoprolol Fumarate 5 Mg Tabs (Bisoprolol Fumarate) .... One Daily 11)  Metolazone 2.5 Mg Tabs (Metolazone) .... Take One Tablet Twice A Week 12)  Diltiazem Hcl Er Beads 180 Mg Xr24h-Cap (Diltiazem Hcl Er Beads) .... Take 1 Tablet By Mouth Once A Day 13)  Lisinopril 40 Mg Tabs (Lisinopril) .... Take One Tablet By Mouth Daily  Allergies (verified): 1)  ! Asa 2)  ! Percocet 3)  ! Darvocet 4)  ! Darvon 5)  ! Penicillin 6)  !  Lipitor (Atorvastatin) 7)  ! * Prevalite 8)  ! Prevacid 9)  ! Clonidine Hcl  Past History:  Past Medical History: ATRIAL FIBRILLATION (ICD-427.31)..paroxysmal. / .cardioversion...after 7 weeks amiodarone 200 mg daily January 26, 2009... successful  / slow heart rate at home.. verapamil lower / clinically returned to atrial fib late August, 2010  /   cardioversion 04/14/2009 on more complete amio load at 400mg /day. successful /  amiodarone stopped for pulmonary illness November, 2010.  The patient improved.  Amiodarone not restarted.  /  rhythm regular August 04, 2009 RENAL INSUFFICIENCY (ICD-588.9) COUMADIN THERAPY (ICD-V58.61) VENTRICULAR HYPERTROPHY, LEFT (ICD-429.3) PULMONARY HYPERTENSION (ICD-416.8) ALLERGIC RHINITIS (ICD-477.9) HYPERCHOLESTEROLEMIA (ICD-272.0) ANXIETY DEPRESSION (ICD-300.4) OBESITY (ICD-278.00) GERD (ICD-530.81) HYPERTENSION (ICD-401.9) PEPTIC ULCER DISEASE (ICD-533.90) THYROTOXICOSIS (ICD-242.90) Hyperlipidemia Volume overload   Zaroxolyn added to other medications July, 2011.... improvement Normal systolic left ventricle function Moderate left ventricular hypertrophy Coumadin therapy Prior amiodarone therapy > stopped  June 01, 2009 .  the medication may have been responsible for pulmonary illness November, 2011.  We cannot be sure, but amiodarone will not be restarted.  Renal insufficiency creatinine 1.9 Coronary artery disease status post catheter at [facility] in 2001 with a 70% circumflex lesion.  /   Myoview scan April, 2010... not gated because of atrial fib.... no ischemia Cough....lisinopril held...04/12/2009 >    cough  resolved Chest discomfort.....04/12/2009 /// GI  ??? Pulmonary illness....05/2009...abnormal CT....ESR not elevated.......Marland KitchenWert     - no desat walking October 11, 2009 but only able to do 185 ft No echo data as of October 24, 2009  Review of Systems       Patient denies fever, chills, headache, sweats, rash, change in vision, change in  hearing, chest pain, nausea vomiting, urinary symptoms.  All of the systems are reviewed and are negative.  Vital Signs:  Patient profile:   65 year old female Height:      67 inches Weight:      239 pounds BMI:     37.57 Pulse rate:   65 / minute BP sitting:   124 / 76  (left arm) Cuff size:   regular  Vitals Entered By: Hardin Negus, RMA (February 21, 2010 10:24 AM)  Physical Exam  General:  patient is stable. Eyes:  no xanthelasma. Neck:  no jugular venous extension. Lungs:  lungs are clear.  Respiratory effort is nonlabored. Heart:  orthopedic exam reveals an S1-S2.  No clicks or significant murmurs. Abdomen:  abdomen is soft obese. Extremities:  there is no significant peripheral edema today.   Impression & Recommendations:  Problem # 1:  SHORTNESS OF BREATH (ICD-786.05)  Her updated medication list for this problem includes:    Furosemide 80 Mg Tabs (Furosemide) .Marland Kitchen... Take one tablet by mouth daily.    Bisoprolol Fumarate 5 Mg Tabs (Bisoprolol fumarate) ..... One daily    Metolazone 2.5 Mg Tabs (Metolazone) .Marland Kitchen... Take one tablet twice a week    Diltiazem Hcl Er Beads 180 Mg Xr24h-cap (Diltiazem hcl er beads) .Marland Kitchen... Take 1 tablet by mouth once a day    Lisinopril 40 Mg Tabs (Lisinopril) .Marland Kitchen... Take one tablet by mouth daily Shortness of breath he is definitely somewhat better with diuresis.  Volume definitely plays a role.  I will keep her on her current meds.  Chemistry will be checked again to be sure that it is stable on the meds she is on.  I am hopeful she'll continue to try to lose no body weight.  He'll continue send copies of my notes to Dr.Wert and asked for help as needed.  Orders: TLB-BMP (Basic Metabolic Panel-BMET) (80048-METABOL) T-2 View CXR (71020TC)  Problem # 2:  CHEST DISCOMFORT (ICD-786.59)  Her updated medication list for this problem includes:    Warfarin Sodium 5 Mg Tabs (Warfarin sodium) ..... Use as directed by anticoagulation clinic     Bisoprolol Fumarate 5 Mg Tabs (Bisoprolol fumarate) ..... One daily    Diltiazem Hcl Er Beads 180 Mg Xr24h-cap (Diltiazem hcl er beads) .Marland Kitchen... Take 1 tablet by mouth once a day    Lisinopril 40 Mg Tabs (Lisinopril) .Marland Kitchen... Take one tablet by mouth daily no chest pain.  Problem # 3:  FLUID OVERLOAD (ICD-276.6) This is definitely somewhat better.  Problem # 4:  ATRIAL FIBRILLATION (ICD-427.31)  Her updated medication list for this problem includes:    Warfarin Sodium 5 Mg Tabs (Warfarin sodium) ..... Use as directed by anticoagulation clinic    Bisoprolol Fumarate 5 Mg Tabs (Bisoprolol fumarate) ..... One daily Rate is controlled today.  No change in therapy.  Problem # 5:  * PULMONARY ILLNESS  05/2009 Followup chest x-ray will be done today to get a new lacunar lungs with her volume under control.  This is an important than baseline.  Patient Instructions: 1)  Labs today 2)  A chest x-ray takes a picture  of the organs and structures inside the chest, including the heart, lungs, and blood vessels. This test can show several things, including, whether the heart is enlarged; whether fluid is building up in the lungs; and whether pacemaker / defibrillator leads are still in place. 3)  Follow up in 8 weeks

## 2010-08-08 NOTE — Assessment & Plan Note (Signed)
Summary: f83m   Visit Type:  Follow-up Referring Provider:  Francella Solian, MD Primary Provider:  Adele Barthel, MD  CC:  shortness of breath.  History of Present Illness: The patient is seen for followup of shortness of breath and atrial fibrillation.  She also has lung disease.  Her chest x-ray had been abnormal and she was seen by Dr.Wert.  Her last visit with him was in April, 2011.  After that pulmonary function studies were done and the report is in the chart.  It is my understanding that there was no new recommendation.  The patient has continued significant shortness of breath.  She also mentions today that she has some tightness in her neck when walking when she begins to feel short of breath.  In the past cardiac catheterization had shown only mild coronary disease.  No clear exercise study in 2010 revealed no significant ischemia.  The patient does have some continued peripheral edema despite diuretics.  Current Medications (verified): 1)  Warfarin Sodium 5 Mg Tabs (Warfarin Sodium) .... Use As Directed By Anticoagulation Clinic 2)  Furosemide 80 Mg Tabs (Furosemide) .Marland Kitchen.. 1 Two Times A Day 3)  Synthroid 150 Mcg Tabs (Levothyroxine Sodium) .... Once Daily 4)  Prilosec Otc 20 Mg Tbec (Omeprazole Magnesium) .... Take  One 30-60 Min Before First Meal of The Day 5)  Ventolin Hfa 108 (90 Base) Mcg/act Aers (Albuterol Sulfate) .... As Needed 6)  Allopurinol 300 Mg Tabs (Allopurinol) .Marland Kitchen.. 1 Once Daily 7)  Systane 0.4-0.3 % Soln (Polyethyl Glycol-Propyl Glycol) .... Uad 8)  Lorazepam 0.5 Mg Tabs (Lorazepam) .... At Bedtime 9)  Potassium Chloride Crys Cr 20 Meq Cr-Tabs (Potassium Chloride Crys Cr) .... Take One Tablet By Mouth Daily 10)  Bisoprolol Fumarate 5 Mg Tabs (Bisoprolol Fumarate) .... One Daily 11)  Proair Hfa 108 (90 Base) Mcg/act Aers (Albuterol Sulfate) .... Every 4-6 Hrs As Needed  Allergies (verified): 1)  ! Asa 2)  ! Percocet 3)  ! Darvocet 4)  ! Darvon 5)  ! Penicillin 6)  !  Lipitor (Atorvastatin) 7)  ! * Prevalite 8)  ! Prevacid 9)  ! Clonidine Hcl  Past History:  Past Medical History: ATRIAL FIBRILLATION (ICD-427.31)..paroxysmal. / .cardioversion...after 7 weeks amiodarone 200 mg daily January 26, 2009... successful  / slow heart rate at home.. verapamil lower / clinically returned to atrial fib late August, 2010  /   cardioversion 04/14/2009 on more complete amio load at 400mg /day. successful /  amiodarone stopped for pulmonary illness November, 2010.  The patient improved.  Amiodarone not restarted.  /  rhythm regular August 04, 2009 RENAL INSUFFICIENCY (ICD-588.9) COUMADIN THERAPY (ICD-V58.61) VENTRICULAR HYPERTROPHY, LEFT (ICD-429.3) PULMONARY HYPERTENSION (ICD-416.8) ALLERGIC RHINITIS (ICD-477.9) HYPERCHOLESTEROLEMIA (ICD-272.0) ANXIETY DEPRESSION (ICD-300.4) OBESITY (ICD-278.00) GERD (ICD-530.81) HYPERTENSION (ICD-401.9) PEPTIC ULCER DISEASE (ICD-533.90) THYROTOXICOSIS (ICD-242.90) Hyperlipidemia Volume overload Normal systolic left ventricle function Moderate left ventricular hypertrophy Coumadin therapy Prior amiodarone therapy > stopped  June 01, 2009 .  the medication may have been responsible for pulmonary illness November, 2011.  We cannot be sure, but amiodarone will not be restarted.  Renal insufficiency creatinine 1.9 Coronary artery disease status post catheter at [facility] in 2001 with a 70% circumflex lesion.  /   Myoview scan April, 2010... not gated because of atrial fib.... no ischemia Cough....lisinopril held...04/12/2009 >    cough resolved Chest discomfort.....04/12/2009 /// GI  ??? Pulmonary illness....05/2009...abnormal CT....ESR not elevated.......Marland KitchenWert     - no desat walking October 11, 2009 but only able to do 185  ft No echo data as of October 24, 2009  Review of Systems       Patient denies fever, chills, headache, sweats, rash, change in vision, change in hearing, nausea vomiting, urinary symptoms.  All other systems are  reviewed and are negative.  Vital Signs:  Patient profile:   65 year old female Height:      67 inches Weight:      247 pounds BMI:     38.83 Pulse rate:   80 / minute BP sitting:   148 / 90  (right arm) Cuff size:   large  Vitals Entered By: Hardin Negus, RMA (January 26, 2010 10:30 AM)  Physical Exam  General:  The patient is stable but significantly overweight. Head:  head is atraumatic. Eyes:  no xanthelasma. Neck:  no jugular venous attention. Chest Wall:  no chest wall tenderness. Lungs:  lungs are clear.  Respiratory effort is not labored. Heart:  cardiac exam reveals S1 and S2.  No clicks or significant murmurs. Abdomen:  abdomen is protuberant but soft. Msk:  no musculoskeletal deformities. Extremities:  1-2+ peripheral edema. Skin:  patient has venous skin changes in her lower extremities related to edema. Psych:  patient is oriented to person time and place.  Affect is normal.   Impression & Recommendations:  Problem # 1:  * PULMONARY ILLNESS  05/2009 The patient will have a followup chest x-ray.  I will be in touch with Dr.Wert to see if he has any further thoughts.  Problem # 2:  CHEST DISCOMFORT (ICD-786.59) The patient does have some neck and jaw discomfort with walking.  EKG is done today and reviewed by me.  There is no QRS change.  The patient has atrial fibrillation.  We will continue to adjust her medications and I will see her back and decide over time if we should proceed with cardiac catheterization.  Problem # 3:  FLUID OVERLOAD (ICD-276.6) The patient is fluid overloaded at this time.  She is on a significant dose of Lasix .  I will try some Zaroxolyn.  Renal function will be checked.  Along with potassium.  Problem # 4:  ATRIAL FIBRILLATION (ICD-427.31)  The patient had been on amiodarone.  This was stopped when we were concerned about her pulmonary status.  She actually held sinus for a period of time.  EKG is done today and reviewed by me.  She  has return of atrial fibrillation.  Her rate is mildly increased.  I will add a low dose of diltiazem to help both with her blood pressure and her atrial fib rate.  We will consider overtime whether other medications should be tried for her atrial fibrillation.  Orders: EKG w/ Interpretation (93000) TLB-BMP (Basic Metabolic Panel-BMET) (80048-METABOL)  Problem # 5:  RENAL INSUFFICIENCY (ICD-588.9) Labs will be drawn for renal function today.  Problem # 6:  OBESITY (ICD-278.00) Weight loss certainly would help here.  Problem # 7:  SHORTNESS OF BREATH (ICD-786.05)  The patient is bothered by significant ongoing shortness of breath.  I have reviewed multiple old records concerning our evaluation up to this point.  There has been an abnormal x-ray that has been followed by Dr.Wert.  She had pulmonary function studies and no med changes were made.  It is possible that she could be having some exertional ischemia.  I will decide at a later date if we proceed with cardiac catheterization.  Her weight certainly comply a role.  Her return to atrial fibrillation is  an additional factor.  I have spent a prolonged period of time today reviewing in re reviewing her data to try to decide how to proceed.  We'll start with the addition of Zaroxolyn 2.5 mg daily.  They're weak careful attention to her weight and her renal function.  Followup chest x-ray will be done today.  I will see her back soon and we will decide if proceeding with cardiac catheterization is appropriate.  I will also try to decide if cardiopulmonary exercise testing as appropriate.  Chemistry will be checked today.  Diltiazem 180 will be added to her meds for atrial fib rate control.  Orders: TLB-BMP (Basic Metabolic Panel-BMET) (80048-METABOL) T-2 View CXR (71020TC)  Patient Instructions: 1)  Start Diltiazem 180mg  daily 2)  Start Zaroxolyn 2.5mg  take only for 3 days along with your AM dose of Lasix, and then decrease to only twice a  week 3)  Labs today 4)  A chest x-ray takes a picture of the organs and structures inside the chest, including the heart, lungs, and blood vessels. This test can show several things, including, whether the heart is enlarged; whether fluid is building up in the lungs; and whether pacemaker / defibrillator leads are still in place. 5)  Follow up in 1 month--Tue. 8/16 at 10:45am Prescriptions: DILTIAZEM HCL ER BEADS 180 MG XR24H-CAP (DILTIAZEM HCL ER BEADS) Take 1 tablet by mouth once a day  #30 x 6   Entered by:   Meredith Staggers, RN   Authorized by:   Talitha Givens, MD, [facility]   Signed by:   Meredith Staggers, RN on 01/26/2010   Method used:   Electronically to        [facility] (retail)       [address]       [place], [place]  [id number]       Ph: [phone]       Fax: [phone]   RxID:   [id number] METOLAZONE 2.5 MG TABS (METOLAZONE) Take one tablet by mouth daily for 3 days then twice a week  #15 x 3   Entered by:   Meredith Staggers, RN   Authorized by:   Talitha Givens, MD, [facility]   Signed by:   Meredith Staggers, RN on 01/26/2010   Method used:   Electronically to        [facility] (retail)       [address]       [place], [place]  [id number]       Ph: [phone]       Fax: [phone]   RxID:   [id number]

## 2010-08-08 NOTE — Miscellaneous (Signed)
Summary: Orders Update pft charges  Clinical Lists Changes  Orders: Added new Service order of Carbon Monoxide diffusing w/capacity [phone]) - Signed Added new Service order of Nebulizer Tx (60454) - Signed Added new Service order of Spirometry (Pre & Post) [phone]) - Signed

## 2010-08-08 NOTE — Progress Notes (Signed)
----   Converted from flag ---- ---- 02/21/2010 3:39 PM, Nyoka Cowden MD wrote: looks like things are heading in the right direction.  note she didn't desat for [place] walking and if she starts to do so despite optimal vol status we definitely need to see her back  Thx! ------------------------------

## 2010-08-08 NOTE — Assessment & Plan Note (Signed)
Summary: 8wk f/u sl   Visit Type:  Follow-up Referring Provider:  Francella Solian, MD Primary Provider:  Adele Barthel, MD  CC:  shortness of breath.  History of Present Illness: The patient is seen for followup of shortness of breath, atrial fibrillation, fluid overload.  Fortunately she continues to improve.  Her chest x-ray did show some improvement when I saw her last.  She feels even better.  Her fluid status is stable.  She's not having any significant chest pain.  Anticoagulation Management History:      Positive risk factors for bleeding include an age of 69 years or older and presence of serious comorbidities.  The bleeding index is 'intermediate risk'.  Positive CHADS2 values include History of HTN.  Negative CHADS2 values include Age > 42 years old.  Her last INR was 2.0 ratio.     Current Medications (verified): 1)  Warfarin Sodium 5 Mg Tabs (Warfarin Sodium) .... Use As Directed By Anticoagulation Clinic 2)  Furosemide 80 Mg Tabs (Furosemide) .... Take One Tablet By Mouth Daily. 3)  Synthroid 150 Mcg Tabs (Levothyroxine Sodium) .... Once Daily 4)  Prilosec Otc 20 Mg Tbec (Omeprazole Magnesium) .... Take  One 30-60 Min Before First Meal of The Day 5)  Ventolin Hfa 108 (90 Base) Mcg/act Aers (Albuterol Sulfate) .... As Needed 6)  Allopurinol 300 Mg Tabs (Allopurinol) .Marland Kitchen.. 1 Once Daily 7)  Systane 0.4-0.3 % Soln (Polyethyl Glycol-Propyl Glycol) .... Uad 8)  Lorazepam 0.5 Mg Tabs (Lorazepam) .... At Bedtime 9)  Potassium Chloride Crys Cr 20 Meq Cr-Tabs (Potassium Chloride Crys Cr) .... Take One Tablet By Mouth Daily 10)  Bisoprolol Fumarate 5 Mg Tabs (Bisoprolol Fumarate) .... One Daily 11)  Metolazone 2.5 Mg Tabs (Metolazone) .... Take One Tablet Twice A Week 12)  Diltiazem Hcl Er Beads 180 Mg Xr24h-Cap (Diltiazem Hcl Er Beads) .... Take 1 Tablet By Mouth Once A Day 13)  Lisinopril 40 Mg Tabs (Lisinopril) .... Take One Tablet By Mouth Daily  Allergies (verified): 1)  ! Asa 2)  !  Percocet 3)  ! Darvocet 4)  ! Darvon 5)  ! Penicillin 6)  ! Lipitor (Atorvastatin) 7)  ! * Prevalite 8)  ! Prevacid 9)  ! Clonidine Hcl  Past History:  Past Medical History: ATRIAL FIBRILLATION (ICD-427.31)..paroxysmal. / .cardioversion...after 7 weeks amiodarone 200 mg daily January 26, 2009... successful  / slow heart rate at home.. verapamil lower / clinically returned to atrial fib late August, 2010  /   cardioversion 04/14/2009 on more complete amio load at 400mg /day. successful /  amiodarone stopped for pulmonary illness November, 2010.  The patient improved.  Amiodarone not restarted.  /  rhythm regular August 04, 2009 /  returned atrial fib by March, 2011... rate controlled RENAL INSUFFICIENCY (ICD-588.9) COUMADIN THERAPY (ICD-V58.61) VENTRICULAR HYPERTROPHY, LEFT (ICD-429.3) PULMONARY HYPERTENSION (ICD-416.8) ALLERGIC RHINITIS (ICD-477.9) HYPERCHOLESTEROLEMIA (ICD-272.0) ANXIETY DEPRESSION (ICD-300.4) OBESITY (ICD-278.00) GERD (ICD-530.81) HYPERTENSION (ICD-401.9) PEPTIC ULCER DISEASE (ICD-533.90) THYROTOXICOSIS (ICD-242.90) Hyperlipidemia Volume overload   Zaroxolyn added to other medications July, 2011.... improvement Normal systolic left ventricle function Moderate left ventricular hypertrophy Coumadin therapy Prior amiodarone therapy > stopped  June 01, 2009 .  the medication may have been responsible for pulmonary illness November, 2011.  We cannot be sure, but amiodarone will not be restarted.  Renal insufficiency creatinine 1.9 Coronary artery disease status post catheter at [facility] in 2001 with a 70% circumflex lesion.  /   Myoview scan April, 2010... not gated because of atrial fib.... no ischemia Cough....lisinopril  held...04/12/2009 >    cough resolved Chest discomfort.....04/12/2009 /// GI  ??? Pulmonary illness....05/2009...abnormal CT....ESR not elevated.......Marland KitchenWert     - no desat walking October 11, 2009 but only able to do 185 ft No echo data as of October 24, 2009  Review of Systems       Patient denies fever, chills, headache, sweats, change in vision, change in hearing, chest pain, cough, nausea vomiting, urinary symptoms.  All other systems are reviewed and are negative.  Vital Signs:  Patient profile:   65 year old female Height:      67 inches Weight:      240 pounds BMI:     37.73 Pulse rate:   80 / minute BP sitting:   134 / 82  (left arm) Cuff size:   large  Vitals Entered By: Hardin Negus, RMA (April 20, 2010 10:40 AM)  Physical Exam  General:  patient is stable but overweight. Eyes:  no xanthelasma. Neck:  no jugulovenous attention. Lungs:  lungs are clear.  Respiratory effort is nonlabored. Heart:  cardiac exam reveals S1-S2.  Heart sounds are distant.  No obvious rales are heard. Abdomen:  abdomen is soft. Extremities:  no significant peripheral edema. Psych:  patient is oriented to person time and place her affect is normal.   Impression & Recommendations:  Problem # 1:  SHORTNESS OF BREATH (ICD-786.05)  Her updated medication list for this problem includes:    Furosemide 80 Mg Tabs (Furosemide) .Marland Kitchen... Take one tablet by mouth daily.    Bisoprolol Fumarate 5 Mg Tabs (Bisoprolol fumarate) ..... One daily    Metolazone 2.5 Mg Tabs (Metolazone) .Marland Kitchen... Take one tablet twice a week    Diltiazem Hcl Er Beads 180 Mg Xr24h-cap (Diltiazem hcl er beads) .Marland Kitchen... Take 1 tablet by mouth once a day    Lisinopril 40 Mg Tabs (Lisinopril) .Marland Kitchen... Take one tablet by mouth daily she is feeling well.  Her breathing is stable.  She has had some bronchitis since I saw her last but this is improved.  No change in therapy.  Problem # 2:  CHEST DISCOMFORT (ICD-786.59)  Her updated medication list for this problem includes:    Warfarin Sodium 5 Mg Tabs (Warfarin sodium) ..... Use as directed by anticoagulation clinic    Bisoprolol Fumarate 5 Mg Tabs (Bisoprolol fumarate) ..... One daily    Diltiazem Hcl Er Beads 180 Mg Xr24h-cap (Diltiazem  hcl er beads) .Marland Kitchen... Take 1 tablet by mouth once a day    Lisinopril 40 Mg Tabs (Lisinopril) .Marland Kitchen... Take one tablet by mouth daily She's not having any chest pain.  No further workup.  Problem # 3:  FLUID OVERLOAD (ICD-276.6) Her volume is well controlled on her current medications.  Labs that were sent to me revealed a creatinine of 1.5 and a BUN of 40 on March 09, 2010.  This is stable for her.  No change in therapy.she is stable overall LC back in 6 months.  Anticoagulation Management Assessment/Plan:      The patient's current anticoagulation dose is Warfarin sodium 5 mg tabs: Use as directed by Anticoagulation Clinic.        Patient Instructions: 1)  Your physician wants you to follow-up in:  6 months.  You will receive a reminder letter in the mail two months in advance. If you don't receive a letter, please call our office to schedule the follow-up appointment.

## 2010-09-13 ENCOUNTER — Encounter (INDEPENDENT_AMBULATORY_CARE_PROVIDER_SITE_OTHER): Payer: Self-pay | Admitting: *Deleted

## 2010-09-19 NOTE — Letter (Signed)
Summary: Appointment - Reschedule  [employer], Main Office  [address]   [place], [place] [address]   Phone: [phone]  Fax: [phone]     September 13, 2010 MRN: [id number]   [facility] [address] [place], [place]  [address]   Dear Ms. Ty Hilts,   Due to a change in our office schedule, your appointment on aPIRL 11,2012 AT 10:30 must be changed.  It is very important that we reach you to reschedule this appointment. We look forward to participating in your health care needs. Please contact us at the number listed above at your earliest convenience to reschedule this appointment.     Sincerely, [facility] Scheduling Team

## 2010-10-10 ENCOUNTER — Encounter: Payer: Self-pay | Admitting: Cardiology

## 2010-10-12 LAB — PROTIME-INR
INR: 1.96 — ABNORMAL HIGH (ref 0.00–1.49)
Prothrombin Time: 22.2 seconds — ABNORMAL HIGH (ref 11.6–15.2)

## 2010-10-15 LAB — BASIC METABOLIC PANEL
BUN: 51 mg/dL — ABNORMAL HIGH (ref 6–23)
CO2: 31 mEq/L (ref 19–32)
Calcium: 9.7 mg/dL (ref 8.4–10.5)
Chloride: 96 mEq/L (ref 96–112)
Creatinine, Ser: 2.32 mg/dL — ABNORMAL HIGH (ref 0.4–1.2)
GFR calc Af Amer: 25 mL/min — ABNORMAL LOW (ref >60)
GFR calc non Af Amer: 21 mL/min — ABNORMAL LOW (ref >60)
Glucose, Bld: 150 mg/dL — ABNORMAL HIGH (ref 70–99)
Potassium: 3.8 mEq/L (ref 3.5–5.1)
Sodium: 139 mEq/L (ref 135–145)

## 2010-10-15 LAB — CBC
MCHC: 33.9 g/dL (ref 30.0–36.0)
MCV: 91.3 fL (ref 78.0–100.0)
Platelets: 148 10*3/uL — ABNORMAL LOW (ref 150–400)
RDW: 16.5 % — ABNORMAL HIGH (ref 11.5–15.5)
WBC: 5.4 10*3/uL (ref 4.0–10.5)

## 2010-10-18 ENCOUNTER — Ambulatory Visit (INDEPENDENT_AMBULATORY_CARE_PROVIDER_SITE_OTHER): Payer: Medicare Other | Admitting: Cardiology

## 2010-10-18 ENCOUNTER — Encounter: Payer: Self-pay | Admitting: Cardiology

## 2010-10-18 ENCOUNTER — Ambulatory Visit: Payer: Self-pay | Admitting: Cardiology

## 2010-10-18 DIAGNOSIS — Z7901 Long term (current) use of anticoagulants: Secondary | ICD-10-CM | POA: Insufficient documentation

## 2010-10-18 DIAGNOSIS — I10 Essential (primary) hypertension: Secondary | ICD-10-CM

## 2010-10-18 DIAGNOSIS — I4891 Unspecified atrial fibrillation: Secondary | ICD-10-CM

## 2010-10-18 DIAGNOSIS — E8770 Fluid overload, unspecified: Secondary | ICD-10-CM | POA: Insufficient documentation

## 2010-10-18 DIAGNOSIS — R0602 Shortness of breath: Secondary | ICD-10-CM | POA: Insufficient documentation

## 2010-10-18 MED ORDER — DILTIAZEM HCL ER COATED BEADS 180 MG PO TB24: 180.0000 mg | ORAL_TABLET | Freq: Every day | ORAL | Status: DC

## 2010-10-18 NOTE — Assessment & Plan Note (Signed)
Systolic blood pressure is elevated today.  I believe that this is somewhat volume related.  I am hopeful that with some diuresis her pressure will come down.  We'll encourage her to see her primary physician for followup.

## 2010-10-18 NOTE — Patient Instructions (Signed)
Your physician wants you to follow-up in:  6 months. You will receive a reminder letter in the mail two months in advance. If you don't receive a letter, please call our office to schedule the follow-up appointment.   

## 2010-10-18 NOTE — Progress Notes (Signed)
HPI Patient is seen for followup CHF and atrial fib.  She has shortness of breath.  However it is actually not as bad as it has been in the past.  She is shortness of breath if she walks up some stairs.  She rests and then proceeds.  She has not had PND orthopnea.  She has a marked response when she uses Zaroxolyn.  She thinks this makes her too dry, specifically she has increased thirst.  A long and careful discussion with her about her thirst.  I explained that she has to try to disconnect thirst from whether she needs fluid.  If she has edema and her weight is up she must try not to drink more fluids regardless of her thirst.  She can try sugar-free candy.  Allergies  Allergen Reactions  . Aspirin   . Atorvastatin   . Cholestyramine   . Clonidine Hydrochloride   . Lansoprazole   . Oxycodone-Acetaminophen   . Penicillins   . Propoxyphene Hcl   . Propoxyphene N-Acetaminophen     Current Outpatient Prescriptions  Medication Sig Dispense Refill  . albuterol (VENTOLIN HFA) 108 (90 BASE) MCG/ACT inhaler Inhale 2 puffs into the lungs every 6 (six) hours as needed.        Marland Kitchen allopurinol (ZYLOPRIM) 300 MG tablet Take 300 mg by mouth daily.        . bisoprolol (ZEBETA) 5 MG tablet Take 10 mg by mouth daily.       Marland Kitchen diltiazem (CARDIZEM LA) 180 MG 24 hr tablet Take 180 mg by mouth daily.        . furosemide (LASIX) 80 MG tablet Take 80 mg by mouth daily.        Marland Kitchen levothyroxine (SYNTHROID, LEVOTHROID) 150 MCG tablet Take 150 mcg by mouth daily.        Marland Kitchen lisinopril (PRINIVIL,ZESTRIL) 40 MG tablet Take 40 mg by mouth daily.        Marland Kitchen LORazepam (ATIVAN) 0.5 MG tablet Take 0.5 mg by mouth at bedtime.        . metolazone (ZAROXOLYN) 2.5 MG tablet Take 2.5 mg by mouth 2 (two) times a week.        Marland Kitchen omeprazole (PRILOSEC) 20 MG capsule Take 20 mg by mouth daily.        Bertram Gala Glycol-Propyl Glycol (SYSTANE) 0.4-0.3 % SOLN as directed.        . potassium chloride SA (K-DUR,KLOR-CON) 20 MEQ tablet Take 20  mEq by mouth daily.        Marland Kitchen warfarin (COUMADIN) 5 MG tablet Take 5 mg by mouth as directed.          History   Social History  . Marital Status: Married    Spouse Name: N/A    Number of Children: N/A  . Years of Education: N/A   Occupational History  . Not on file.   Social History Main Topics  . Smoking status: Former Smoker -- 1.0 packs/day for 25 years    Quit date: 07/09/1990  . Smokeless tobacco: Not on file  . Alcohol Use: No  . Drug Use: No  . Sexually Active: Not on file   Other Topics Concern  . Not on file   Social History Narrative  . No narrative on file    Family History  Problem Relation Age of Onset  . Breast cancer Maternal Aunt   . Lung cancer Mother     heavy smoker  . Heart disease Father   .  Heart disease Maternal Grandfather     Past Medical History  Diagnosis Date  . Atrial fibrillation 09/29/2008    Paroxysmal, amiodarone therapy with cardioversion to sinus rhythm in the past. / cardioversion on higher dose amiodarone successful October, 2010 / amiodarone stopped or pulmonary illness November, 2010, patient improved, amiodarone not restarted / regular rhythm January, 2011 / returned atrial fibrillation by March, 2011... plan rate control  . RENAL INSUFFICIENCY 09/29/2008    Creatinine 1.9  . VENTRICULAR HYPERTROPHY, LEFT 09/29/2008  . PULMONARY HYPERTENSION 09/29/2008  . ALLERGIC RHINITIS 09/29/2008  . HYPERCHOLESTEROLEMIA 09/29/2008  . ANXIETY DEPRESSION 09/29/2008  . OBESITY 09/29/2008  . GERD 09/29/2008  . HYPERTENSION 09/29/2008    No echo data as of April, 2011 ( normal LV function by history)  . PEPTIC ULCER DISEASE 09/29/2008  . THYROTOXICOSIS 09/29/2008  . CHEST DISCOMFORT 05/30/2009    In October,2010  ,??GI??  . CAD 10/20/2008    Catheterization [facility] 2001, 70% circumflex / nuclear April, 2010 no ischemia, not gated with atrial fib  . Cough 04/12/2009    Her lisinopril held October, 2010, cough resolved  . Warfarin anticoagulation   .  Peptic ulcer disease   . Fluid overload     Improvement with Zaroxolyn July, 2011  . Shortness of breath     Amiodarone will not be restarted  /     Pulmonary illness November, 2010, abnormal CT, sedimentation rate not elevated, Dr Sherene Sires saw patient,, no desaturation walking April, 2011, plan to follow    Past Surgical History  Procedure Date  . Tonsillectomy and adenoidectomy 1953  . Dilation and curettage of uterus 2004    x2  . Hernia repair 2003    ROS  Patient denies fever, chills, headache, sweats, rash, change in vision, change in hearing, chest pain, cough, nausea vomiting, urinary symptoms.  All other systems are reviewed and are negative.  PHYSICAL EXAM Patient is overweight.  She is stable.  She is oriented to person time and place.  Affect is normal.  Head is atraumatic.  There is no xanthelasma.  Lungs reveal a few scattered rhonchi.  There is no jugular venous distention.  Cardiac exam reveals an irregular irregular rhythm.  There is a soft systolic murmur.  The abdomen is soft.  She does have 1+ peripheral edema. Filed Vitals:   10/18/10 1416  BP: 180/98  Pulse: 69  Resp: 18  Height: 5\' 6"  (1.676 m)  Weight: 253 lb 6.4 oz (114.941 kg)    EKG EKG is done today and reviewed by me.  She has atrial fibrillation with a controlled rate.  ASSESSMENT & PLAN

## 2010-10-18 NOTE — Assessment & Plan Note (Signed)
Her atrial fibrillation control.  No change in therapy.

## 2010-11-21 NOTE — Op Note (Signed)
NAME:  Kristy [name], Kristy [name] NO.:  [id number]   MEDICAL RECORD NO.:  [id number]          PATIENT TYPE:  OIB   LOCATION:  2899                         FACILITY:  [facility]   PHYSICIAN:  Luis Abed, MD, FACCDATE OF BIRTH:  Nov 22, 1945   DATE OF PROCEDURE:  01/25/2009  DATE OF DISCHARGE:  01/25/2009                               OPERATIVE REPORT   The patient has a history of atrial fib.  Most recently, she has had  significant symptoms and she has been very carefully loaded with  amiodarone.  Her INR has been watched very carefully and it has been  well above 2.0 for greater than 3-4 weeks.  She is clinically stable and  her potassium is normal.   Anesthesia was present and the patient received 110 mg of IV propofol.  Anterior-posterior biphasic pads were in place.  The patient received  120 joules and converted to sinus rhythm.   The patient is stable and waking up nicely.  I will be in touch with her  to arrange for followup visit.  No change in her medicines.      Luis Abed, MD, [facility]  Electronically Signed     JDK/MEDQ  D:  01/25/2009  T:  01/26/2009  Job:  [id number]

## 2010-11-21 NOTE — Assessment & Plan Note (Signed)
[facility]                            CARDIOLOGY OFFICE NOTE   Kristy [name], Kristy [name]                       MRN:          [id number]  DATE:03/31/2008                            DOB:          [birth year]    Kristy [name] is here for cardiology followup today.  I saw her husband  earlier today, as he is establishing his cardiology care with [place].  She  is actually doing well.  In April, we cardioverted her again.  When I  saw her in May, she was holding sinus rhythm.  Since that time, she has  gone about full activities and she has no significant complaints.  However, on examination today she is back in atrial fibrillation.   PAST MEDICAL HISTORY:   ALLERGIES:  See the prior allergy listing on the prior notes.   MEDICATIONS:  Coumadin, furosemide, labetalol, diltiazem, lisinopril,  Synthroid, Prilosec, and lorazepam.   OTHER MEDICAL PROBLEMS:  See the extensive list on my note of September 24, 2007.   REVIEW OF SYSTEMS:  Review of systems today is negative.  She feels  quite well.  She was at the beach recently and was active and feels  well.  Otherwise, review of systems is negative.   PHYSICAL EXAMINATION:  VITAL SIGNS:  Blood pressure is 138/80 with a  pulse of 64.  GENERAL:  The patient is oriented to person, time, and place.  Affect is  normal.  Her husband is in the room.  HEENT:  No xanthelasma.  She has normal extraocular motion.  NECK:  There are no carotid bruits.  There is no jugular venous  distention.  LUNGS:  Clear.  Respiratory effort is not labored.  CARDIAC:  Her rhythm is irregularly irregular.  There are no clicks or  significant murmurs.  ABDOMEN:  Obese but soft.  EXTREMITIES:  She has no significant peripheral edema.   EKG reveals atrial fibrillation with a controlled ventricular response.   Problems are listed on the note of September 24, 2007, and it is an  extensive list.  #18.  Paroxysmal atrial fibrillation.  As outlined, we  cardioverted her  second time and this was done in April 2009.  In May 2009, she was in  sinus, but now she is in atrial fibrillation.  Her rate is controlled.  She is not having significant symptoms.  We had a careful discussion  about this and we have decided to leave her in atrial fibrillation on  Coumadin with rate control.  She is stable.   No change in her therapy.  I will see her back in 6 months also.     Luis Abed, MD, [facility]  Electronically Signed    JDK/MedQ  DD: 03/31/2008  DT: 04/01/2008  Job #: [id number]   cc:   Adele Barthel, MD

## 2010-11-21 NOTE — Assessment & Plan Note (Signed)
[facility]                            CARDIOLOGY OFFICE NOTE   ERIC, MORGANTI                       MRN:          [id number]  DATE:09/24/2007                            DOB:          Jan 30, 1946    Kristy [name] is here to reassess her cardiology follow-up.  I had seen  her last in August 2006.  She has a complicated medical history.  She  has actually done relatively well from the cardiac viewpoint since  August 2006.  In December 2004  I had seen her with atrial fibrillation.  She had shortness of breath related to this.  She had already been  placed on amiodarone.  After very careful consideration at that time I  decided to leave her on low dose of amiodarone.  She was very carefully  coumadinized.  We went ahead with cardioversion, and she converted to  sinus rhythm and held sinus rhythm.  I saw her last in August 2006, and  at that time she remained in sinus rhythm.  Also at that time she had  been on amiodarone 100 mg daily.  Over time her amiodarone has been  stopped.  I do not know the exact timing.  I know that she has had an  episode of pancreatitis since I saw her last.  She also tells me that  she thinks her heart rhythm remained regular until approximately  November 2008.  Somewhere in that time she then was noted to be in  atrial fibrillation.  She has had a shortness of breath with exertion  since then.  She had shortness of breath in the past with her atrial  fibrillation.   Her blood pressure also has been elevated.  Dr. Reinaldo Raddle has been working  with this and has been adjusting her medications.  Clonidine was tried  recently.  She had noted tingling in her hands and her lips and felt  poorly, and at this point it is felt that she may have some type of  allergy to clonidine and this will have to be kept in mind.  She is now  here for further assessment of her overall situation.   ALLERGIES:  ASPIRIN, DARVOCET, and PENICILLIN and  now the question of  CLONIDINE.   MEDICATIONS:  1. Coumadin.  2. Furosemide 160 mg daily.  3. Labetalol 600 mg daily.  4. Lorazepam.  5. Diltiazem 360 mg daily.  6. Lisinopril 40 mg daily.  7. Synthroid 150 mcg daily.  8. Prilosec.  9. Vitamins.   OTHER MEDICAL PROBLEMS:  See the complete list below.   REVIEW OF SYSTEMS:  She is not having any significant headaches or eye  problems.  She is not having any nausea, vomiting or diaphoresis.  There  is no significant cough.  She is not having any problems with diarrhea.  She does have shortness of breath with exertion.  Otherwise, her Review  of Systems is negative.   PHYSICAL EXAMINATION:  VITAL SIGNS:  Weight is 235 pounds.  This is  decreased since February 14, 2005 when her weight was 248.  Blood pressure  is 160/90, pulse 71.  GENERAL:  The patient is significantly overweight.  She is stable today.  HEENT:  Reveals no xanthelasma.  She has normal extraocular motion.  She  has no significant dental problems at this point.  NECK:  There is no jugular venous distention.  There are no carotid  bruits.  LUNGS:  Clear.  Respiratory effort is not labored.  CARDIAC:  Reveals S1 with an S2.  There are no clicks or significant  murmurs.  The rhythm is irregularly irregular.  ABDOMEN:  Obese.  There are no masses or bruits.  EXTREMITIES:  She has 1+ edema in the right foot which she says is  chronic.  She has no significant edema in her left foot.  Overall there  are no major musculoskeletal deformities.   EKG revealed atrial fibrillation.  She has nonspecific ST-T wave  changes.  Her atrial fibrillation rate is controlled.   I have extensive notes from Dr. Reinaldo Raddle, and they are very carefully  reviewed.  The patient did have a 2-D echo done very recently.  This  study shows that she has moderate left ventricular hypertrophy.  Her  left atrium is dilated in the range of 5 cm.  There is no pericardial  effusion.  Her ejection fraction is  60%.  Right ventricular systolic  pressure is 42 mmHg.  She has mild mitral regurgitation and mild  tricuspid regurgitation.   PROBLEMS:  1. History of thyrotoxicosis in the past.  She is on thyroid      replacement.  I am assuming at this point that her thyroid labs      have been stable recently.  2. History of peptic ulcer disease.  This does not appear to be a      significant issue at this time.  3. Hypertension.  Despite the fact that her blood pressure is higher      than we would like today I have chosen not to change her medicines      any further as of today.  4. History of esophageal reflux.  5. Obesity.  This is clearly a problem, and she needs to lose weight.  6. History of depression and anxiety.  She appears stable at this      time.  7. Hypercholesterolemia.  This is to be followed by Dr. Reinaldo Raddle.  8. History of chest pain.  It is my understanding that catheterization      at [facility] in 2001 revealed a 70% circumflex lesion.  I am not      inclined to push for any testing in this regard at this point.  9. History of some volume overload in the past.  Some of this was      related to her atrial fibrillation.  10.History of allergic rhinitis.  11.History of mild pulmonary hypertension.  12.Normal systolic left ventricular function.  13.Moderate left ventricular hypertrophy.  14.Allergy to ASPIRIN, DARVOCET, and PENICILLIN.  15.Coumadin therapy.  This is ongoing.  She tells me that her levels      have been quite good, and there has been no need for dose change.      See the discussion below.  16.History of prior amiodarone therapy.  As mentioned when I saw her      in 2006 she was on amiodarone and she is not on it now.  We may      have to restart her amiodarone at some point.  See the discussion  below.  17.Some renal insufficiency.  She has a history of creatinine in the      range of 1.9.  18.*Paroxysmal atrial fibrillation.  It would appear that she had  held      sinus rhythm until November 2008.  It also appears that she does      have some symptoms from her atrial fibrillation primarily her      shortness of breath.  Her left atrium is dilated.  Despite this I      feel that it is a very reasonable to be sure that we follow her      prothrombin time weekly for several weeks and then proceed with      cardioversion to see if we can convert her back to sinus rhythm.      If we are successful and this holds, this will be excellent.  If      not we will then reassess whether or not amiodarone can or should      be restarted with another attempt at cardioversion.  At this point      I am not convinced that settling for rate control is our best      option until we try a more aggressive route.  She knows when she      has her atrial fibrillation, and it does affect her breathing.   I will see her back for careful follow-up as described above.     Luis Abed, MD, [facility]  Electronically Signed    JDK/MedQ  DD: 09/24/2007  DT: 09/24/2007  Job #: [id number]   cc:   Jonelle Sports. Reinaldo Raddle, M.D.

## 2010-11-21 NOTE — Assessment & Plan Note (Signed)
[facility]                            CARDIOLOGY OFFICE NOTE   Kristy [name], Kristy [name]                       MRN:          [id number]  DATE:09/30/2008                            DOB:          10/25/1945    Kristy [name] is here for a followup.  She has had atrial fib in the past.  We had cardioverted her a second time in April 2009.  When I saw her  last in September 2009, she was in atrial fib and she actually felt  relatively well.  Therefore, we decided not to make any other changes.  She had been on amiodarone in the past.  I have raised the question of  whether or not we will ever want to restarted.   Most recently, she had some shortness of breath.  This is with exertion,  but there is also probably some PND and orthopnea.  She also notes that  she gets swelling in her left leg more than the right.  I suspect that  there is a volume component to this.   PAST MEDICAL HISTORY:   ALLERGIES:  Historically allergies have been listed as ASPIRIN,  PERCOCET, DARVOCET, DARVON, and PENICILLIN.   MEDICATIONS:  See the flow sheet.  This does include Coumadin.  It also  includes Lasix 80 mg b.i.d.   OTHER MEDICAL PROBLEMS:  See the complete list on my note of September 24, 2007.   REVIEW OF SYSTEMS:  She has no fevers or chills.  There are no  headaches.  She is significantly overweight.  She has had some question  of gout.  She is on allopurinol.  Otherwise, review of systems is  negative.   PHYSICAL EXAMINATION:  VITAL SIGNS:  Blood pressure is 110/88 with a  pulse of 72.  Weight is 247 pounds.  GENERAL:  The patient is oriented to person, time, and place.  Affect is  normal.  HEENT:  No xanthelasma.  She has normal extraocular motion.  NECK:  There are no carotid bruits.  There is no jugular venous  distention.  LUNGS:  Clear.  Respiratory effort is not labored.  CARDIAC:  An S1 with an S2.  There are no clicks or significant murmurs.  ABDOMEN:   Obese, but soft.  EXTREMITIES:  She has mild edema in the left lower extremity.   EKG has done today and reviewed by me.  She has atrial fibrillation with  a controlled ventricular response.   See the note of September 24, 2007 for my extensive problem list.  The  patient has shortness of breath at this time.  She drinks a significant  excess water each day.  She is on high-dose Lasix.  I have convinced her  to cut back on her fluid intake at this point.  I will then see her  back.  We  will then decide if we need to adjust her meds further.  We will also  decide, we will consider cardioversion or of the readdition of  amiodarone.  I will see her for a followup.  Luis Abed, MD, [facility]  Electronically Signed    JDK/MedQ  DD: 09/30/2008  DT: 10/01/2008  Job #: [id number]   cc:   Dr. Adele Barthel, [place], [place]

## 2010-11-21 NOTE — Assessment & Plan Note (Signed)
[facility]                            CARDIOLOGY OFFICE NOTE   MYLEE, FALIN                       MRN:          [id number]  DATE:11/17/2007                            DOB:          Aug 07, 1945    I saw Ms. Amil Amen last in the office on September 24, 2007.  At that time,  we decided to proceed with a cardioversion.  She was very carefully  anticoagulated.  Then, on November 03, 2007, she was cardioverted.  She did  receive 350 mg of IV Pentothal.  With the first dose of 125 joules with  anterior/posterior pads and a biphasic defibrillator, she remained in  atrial fibrillation.  With 200 joules, she converted to sinus and she  has held sinus.  In fact, she has held sinus despite a fairly  significant bronchitis for which she is being treated.  She is receiving  antibiotics and tapering steroids at this time.  Her cough continues,  but overall, she is feeling better.   PAST MEDICAL HISTORY:  ALLERGIES:  ASPIRIN, DARVOCET, DARVON,  PENICILLIN.  SHE FELT VERY POORLY WITH LIPITOR.  SHE HAD MUSCLE AND  JOINT ACHES AND PAINS AND SOME DIFFICULTY WITH USE OF HER HANDS.   MEDICATIONS:  1. Coumadin.  2. Furosemide.  3. Labetalol.  4. Diltiazem.  5. Lisinopril.  6. Synthroid.  7. Prilosec.  8. Antibiotic.  9. Clarithromycin.  10.Prednisone taper.   OTHER MEDICAL PROBLEMS:  See the very extensive list on my note of September 24, 2007.   REVIEW OF SYSTEMS:  Other than her cough, she is doing well.  She says  that she definitely felt better after she was converted back to sinus  rhythm.   REVIEW OF SYSTEMS:  Otherwise is negative.   PHYSICAL EXAMINATION:  Blood pressure today is 150/78 with a pulse of  66.  The patient is oriented to person, time and place.  She is  coughing.  LUNGS:  Reveal scattered rhonchi.  There is no respiratory distress.  CARDIAC EXAM:  Reveals a S1 with a S2.  There are no clicks or  significant murmurs.  Her abdomen is soft.  She has no peripheral edema.   PROBLEMS:  Are listed extensively in the note of September 24, 2007:  #3.  Hypertension.  We may need to push her medications further when I  see her back.  #7.  Hypercholesterolemia.  See the discussion above about her  significant intolerance to Lipitor.  She has been tried on a powder  recently and felt poorly with it and then stopped it.  I think that it  is reasonable to try to challenge her again with a different statin.  We  will do this when she is completely resolved from her bronchitis and  after she has made her trip across the country.  #8.  History of coronary disease with a catheterization showing a 70%  circumflex lesion at [facility] in 2001.  This is the basis upon which I  feel that trying to try to push a statin is appropriate.  #15.  Ongoing  Coumadin therapy, and we will continue this.  #16.  The patient did receive amiodarone in the past.  If she does not  hold, we may need to restart amiodarone as it really appears that she  does better in sinus rhythm.  #18.  Paroxysmal atrial fibrillation.  She is holding sinus after her  cardioversion.   I will see her back in 3 months, and we will review these issues again.     Luis Abed, MD, [facility]  Electronically Signed    JDK/MedQ  DD: 11/17/2007  DT: 11/17/2007  Job #: [id number]   cc:   Adele Barthel, M.D.

## 2010-11-21 NOTE — Op Note (Signed)
NAME:  Kristy [name], Kristy [name] NO.:  [id number]   MEDICAL RECORD NO.:  [id number]          PATIENT TYPE:  OIB   LOCATION:  2854                         FACILITY:  [facility]   PHYSICIAN:  Luis Abed, MD, FACCDATE OF BIRTH:  Aug 23, 1945   DATE OF PROCEDURE:  11/03/2007  DATE OF DISCHARGE:                               OPERATIVE REPORT   The patient has atrial fib and she is brought in today for  cardioversion.  She was very carefully anticoagulated.   PROCEDURE:  Anesthesia was present and the patient received 350 mg of IV  Pentothal in the total dose.  She first received 125 joules anterior-  posterior pads with a biphasic defibrillator and she remained in atrial  fib.  She then received 200 joules and she converted to sinus rhythm.  She remained stable and was fully alert and was allowed to go home.   Successful cardioversion.  We will be in touch with the patient.  She  knows to remain on Coumadin and will see her for followup.      Luis Abed, MD, [facility]  Electronically Signed     JDK/MEDQ  D:  11/03/2007  T:  11/04/2007  Job:  [id number]

## 2010-11-24 NOTE — Op Note (Signed)
NAME:  Kristy [name], Kristy [name] NO.:  [id number]   MEDICAL RECORD NO.:  [id number]          PATIENT TYPE:  OIB   LOCATION:  2899                         FACILITY:  [facility]   PHYSICIAN:  Willa Rough, M.D.     DATE OF BIRTH:  15-Nov-1945   DATE OF PROCEDURE:  06/09/2004  DATE OF DISCHARGE:                                 OPERATIVE REPORT   PROCEDURE:  Cardioversion.   The patient was carefully prepared for cardioversion.  Anesthesia was  present, and she received 175 mg of IV Pentothal.  Anterior-posterior pads  were placed with the biphasic defibrillator.  One hundred twenty joules led  to the patient remaining in atrial fibrillation.  She was then shocked with  200 joules and converted to sinus rhythm.   The patient's INR is 2.0.  She will take 7.5 mg of Coumadin today, Friday,  also Saturday and Sunday and have her INR checked Monday and called to our  office.       JK/MEDQ  D:  06/09/2004  T:  06/10/2004  Job:  [id number]

## 2011-04-03 LAB — PROTIME-INR: Prothrombin Time: 27.3 — ABNORMAL HIGH

## 2011-05-18 ENCOUNTER — Ambulatory Visit: Payer: Medicare Other | Admitting: Cardiology

## 2011-06-05 ENCOUNTER — Ambulatory Visit (INDEPENDENT_AMBULATORY_CARE_PROVIDER_SITE_OTHER): Payer: Medicare Other | Admitting: Cardiology

## 2011-06-05 ENCOUNTER — Encounter: Payer: Self-pay | Admitting: Cardiology

## 2011-06-05 DIAGNOSIS — I2510 Atherosclerotic heart disease of native coronary artery without angina pectoris: Secondary | ICD-10-CM

## 2011-06-05 DIAGNOSIS — I4891 Unspecified atrial fibrillation: Secondary | ICD-10-CM

## 2011-06-05 DIAGNOSIS — E8779 Other fluid overload: Secondary | ICD-10-CM

## 2011-06-05 NOTE — Assessment & Plan Note (Signed)
Her volume status is stable.  It is very important that she take her Lasix and keep her feet elevated.  I would like for her to wear support hose if she could

## 2011-06-05 NOTE — Assessment & Plan Note (Signed)
Patient has been stable with rate control for her atrial fibrillation.  No further workup.

## 2011-06-05 NOTE — Patient Instructions (Signed)
Your physician wants you to follow-up in: 6 MONTHS WITH DR KATZ  You will receive a reminder letter in the mail two months in advance. If you don't receive a letter, please call our office to schedule the follow-up appointment. Your physician recommends that you continue on your current medications as directed. Please refer to the Current Medication list given to you today. 

## 2011-06-05 NOTE — Progress Notes (Signed)
HPI  Patient is seen for followup of atrial fibrillation.  I saw her last April, 2012.  Over time he finally decided to leave her in chronic atrial fibrillation.  She is on Coumadin.  She is not having any more palpitations.  Her volume status is stable.  She does admit that on certain days she does not take her Lasix because she is up and around and active.  I've encouraged her to take the Lasix when she gets home on those days.  Since being here last she underwent laparoscopic cholecystectomy without any cardiovascular difficulties.   Allergies  Allergen Reactions  . Amiodarone   . Aspirin   . Atorvastatin   . Cholestyramine   . Clonidine Hydrochloride   . Lansoprazole   . Oxycodone-Acetaminophen   . Penicillins   . Prevalite (Cholestyramine Light)   . Propoxyphene Hcl   . Propoxyphene N-Acetaminophen     Current Outpatient Prescriptions  Medication Sig Dispense Refill  . albuterol (VENTOLIN HFA) 108 (90 BASE) MCG/ACT inhaler Inhale 2 puffs into the lungs every 6 (six) hours as needed.        Marland Kitchen allopurinol (ZYLOPRIM) 300 MG tablet Take 300 mg by mouth daily.        Marland Kitchen diltiazem (CARDIZEM LA) 180 MG 24 hr tablet Take 1 tablet (180 mg total) by mouth daily.  90 tablet  3  . furosemide (LASIX) 80 MG tablet Take 80 mg by mouth daily.        Marland Kitchen levothyroxine (SYNTHROID, LEVOTHROID) 150 MCG tablet Take 150 mcg by mouth daily.        Marland Kitchen lisinopril (PRINIVIL,ZESTRIL) 40 MG tablet Take 40 mg by mouth daily.        Marland Kitchen LORazepam (ATIVAN) 0.5 MG tablet Take 0.5 mg by mouth at bedtime.        . metolazone (ZAROXOLYN) 2.5 MG tablet Take 2.5 mg by mouth 2 (two) times a week.        . nebivolol (BYSTOLIC) 10 MG tablet Take 10 mg by mouth daily.        Marland Kitchen omeprazole (PRILOSEC) 20 MG capsule Take 20 mg by mouth daily.        Bertram Gala Glycol-Propyl Glycol (SYSTANE) 0.4-0.3 % SOLN as directed.        . potassium chloride SA (K-DUR,KLOR-CON) 20 MEQ tablet Take 20 mEq by mouth daily.        Marland Kitchen warfarin  (COUMADIN) 5 MG tablet Take 5 mg by mouth as directed.        . bisoprolol (ZEBETA) 5 MG tablet Take 10 mg by mouth daily.         History   Social History  . Marital Status: Married    Spouse Name: N/A    Number of Children: N/A  . Years of Education: N/A   Occupational History  . Not on file.   Social History Main Topics  . Smoking status: Former Smoker -- 1.0 packs/day for 25 years    Quit date: 07/09/1990  . Smokeless tobacco: Not on file  . Alcohol Use: No  . Drug Use: No  . Sexually Active: Not on file   Other Topics Concern  . Not on file   Social History Narrative  . No narrative on file    Family History  Problem Relation Age of Onset  . Breast cancer Maternal Aunt   . Lung cancer Mother     heavy smoker  . Heart disease Father   . Heart  disease Maternal Grandfather     Past Medical History  Diagnosis Date  . Atrial fibrillation 09/29/2008    Paroxysmal, amiodarone therapy with cardioversion to sinus rhythm in the past. / cardioversion on higher dose amiodarone successful October, 2010 / amiodarone stopped or pulmonary illness November, 2010, patient improved, amiodarone not restarted / regular rhythm January, 2011 / returned atrial fibrillation by March, 2011... plan rate control  . RENAL INSUFFICIENCY 09/29/2008    Creatinine 1.9  . VENTRICULAR HYPERTROPHY, LEFT 09/29/2008  . PULMONARY HYPERTENSION 09/29/2008  . ALLERGIC RHINITIS 09/29/2008  . HYPERCHOLESTEROLEMIA 09/29/2008  . ANXIETY DEPRESSION 09/29/2008  . OBESITY 09/29/2008  . GERD 09/29/2008  . HYPERTENSION 09/29/2008    No echo data as of April, 2011 ( normal LV function by history)  . PEPTIC ULCER DISEASE 09/29/2008  . THYROTOXICOSIS 09/29/2008  . CHEST DISCOMFORT 05/30/2009    In October,2010  ,??GI??  . CAD 10/20/2008    Catheterization [facility] 2001, 70% circumflex / nuclear April, 2010 no ischemia, not gated with atrial fib  . Cough 04/12/2009    Her lisinopril held October, 2010, cough resolved  .  Warfarin anticoagulation   . Peptic ulcer disease   . Fluid overload     Improvement with Zaroxolyn July, 2011  . Shortness of breath     Amiodarone will not be restarted  /     Pulmonary illness November, 2010, abnormal CT, sedimentation rate not elevated, Dr Sherene Sires saw patient,, no desaturation walking April, 2011, plan to follow    Past Surgical History  Procedure Date  . Tonsillectomy and adenoidectomy 1953  . Dilation and curettage of uterus 2004    x2  . Hernia repair 2003    ROS     Patient denies fever, chills, headache, sweats, rash, change in vision, change in hearing, chest pain, cough, nausea vomiting, urinary symptoms.  All other systems are reviewed and are negative.  PHYSICAL EXAM  Patient is overweight but stable.  There is no jugular venous distention.  Lungs are clear.  Respiratory effort is unlabored.  Cardiac exam reveals S1-S2.  The rhythm is irregularly irregular compatible with her atrial fibrillation.  Abdomen is protuberant but soft.  She does have mild edema in the left lower extremity.  There is also some mild redness.  There no musculoskeletal deformities.  Filed Vitals:   06/05/11 1112  BP: 134/82  Pulse: 78  Height: 5\' 6"  (1.676 m)  Weight: 245 lb 12.8 oz (111.494 kg)    ASSESSMENT & PLAN

## 2011-06-05 NOTE — Assessment & Plan Note (Signed)
Coronary disease is stable.  No further workup. 

## 2011-10-22 ENCOUNTER — Other Ambulatory Visit: Payer: Self-pay | Admitting: Cardiology

## 2011-12-05 ENCOUNTER — Encounter: Payer: Self-pay | Admitting: Cardiology

## 2011-12-05 ENCOUNTER — Ambulatory Visit (INDEPENDENT_AMBULATORY_CARE_PROVIDER_SITE_OTHER): Payer: Medicare Other | Admitting: Cardiology

## 2011-12-05 VITALS — BP 172/96 | HR 84 | Ht 66.0 in | Wt 245.0 lb

## 2011-12-05 DIAGNOSIS — E8779 Other fluid overload: Secondary | ICD-10-CM

## 2011-12-05 DIAGNOSIS — I10 Essential (primary) hypertension: Secondary | ICD-10-CM

## 2011-12-05 DIAGNOSIS — I2510 Atherosclerotic heart disease of native coronary artery without angina pectoris: Secondary | ICD-10-CM

## 2011-12-05 DIAGNOSIS — I4891 Unspecified atrial fibrillation: Secondary | ICD-10-CM

## 2011-12-05 DIAGNOSIS — Z7901 Long term (current) use of anticoagulants: Secondary | ICD-10-CM

## 2011-12-05 NOTE — Assessment & Plan Note (Signed)
Blood pressure is elevated today. I suspect that there is a volume component. She needs to get back on her regular Lasix and then followup her blood pressure.

## 2011-12-05 NOTE — Progress Notes (Signed)
HPI Patient is seen today to followup atrial fibrillation and fluid overload.I saw her last November, 2012 she was stable. There is some days when she is out around she does not take her Lasix. This generally leads to her becoming fluid overloaded. She is stable currently but continues to have the same difficulties. When she takes her diuretic regularly she is quite stable. She's not having any chest pain. She does not feel palpitations from her atrial fib. She is anticoagulated.  Allergies  Allergen Reactions  . Amiodarone   . Aspirin   . Atorvastatin   . Cholestyramine   . Clonidine Hydrochloride   . Lansoprazole   . Oxycodone-Acetaminophen   . Penicillins   . Prevalite (Cholestyramine Light)   . Propoxyphene Hcl   . Propoxyphene-Acetaminophen     Current Outpatient Prescriptions  Medication Sig Dispense Refill  . albuterol (VENTOLIN HFA) 108 (90 BASE) MCG/ACT inhaler Inhale 2 puffs into the lungs every 6 (six) hours as needed.        Marland Kitchen allopurinol (ZYLOPRIM) 300 MG tablet Take 300 mg by mouth daily.        . bisoprolol (ZEBETA) 5 MG tablet Take 10 mg by mouth daily.       . clobetasol ointment (TEMOVATE) 0.05 % as directed.      . diltiazem (CARDIZEM CD) 180 MG 24 hr capsule take 1 capsule by mouth once daily  90 capsule  3  . furosemide (LASIX) 80 MG tablet 1/2 - 1 tab daily      . levothyroxine (SYNTHROID, LEVOTHROID) 150 MCG tablet Take 150 mcg by mouth daily.        Marland Kitchen lisinopril (PRINIVIL,ZESTRIL) 40 MG tablet Take 40 mg by mouth daily.        Marland Kitchen LORazepam (ATIVAN) 0.5 MG tablet Take 0.5 mg by mouth at bedtime.        . nebivolol (BYSTOLIC) 10 MG tablet Take 10 mg by mouth daily.        Marland Kitchen omeprazole (PRILOSEC) 20 MG capsule Take 20 mg by mouth daily.        Bertram Gala Glycol-Propyl Glycol (SYSTANE) 0.4-0.3 % SOLN as directed.        . potassium chloride SA (K-DUR,KLOR-CON) 20 MEQ tablet Take 20 mEq by mouth daily.        . traZODone (DESYREL) 50 MG tablet Take 1 tablet by  mouth at bedtime.      Marland Kitchen warfarin (COUMADIN) 5 MG tablet Take 5 mg by mouth as directed.        Marland Kitchen DISCONTD: diltiazem (CARDIZEM LA) 180 MG 24 hr tablet Take 1 tablet (180 mg total) by mouth daily.  90 tablet  3    History   Social History  . Marital Status: Married    Spouse Name: N/A    Number of Children: N/A  . Years of Education: N/A   Occupational History  . Not on file.   Social History Main Topics  . Smoking status: Former Smoker -- 1.0 packs/day for 25 years    Quit date: 07/09/1990  . Smokeless tobacco: Not on file  . Alcohol Use: No  . Drug Use: No  . Sexually Active: Not on file   Other Topics Concern  . Not on file   Social History Narrative  . No narrative on file    Family History  Problem Relation Age of Onset  . Breast cancer Maternal Aunt   . Lung cancer Mother     heavy  smoker  . Heart disease Father   . Heart disease Maternal Grandfather     Past Medical History  Diagnosis Date  . Atrial fibrillation 09/29/2008    Paroxysmal, amiodarone therapy with cardioversion to sinus rhythm in the past. / cardioversion on higher dose amiodarone successful October, 2010 / amiodarone stopped or pulmonary illness November, 2010, patient improved, amiodarone not restarted / regular rhythm January, 2011 / returned atrial fibrillation by March, 2011... plan rate control  . RENAL INSUFFICIENCY 09/29/2008    Creatinine 1.9  . VENTRICULAR HYPERTROPHY, LEFT 09/29/2008  . PULMONARY HYPERTENSION 09/29/2008  . ALLERGIC RHINITIS 09/29/2008  . HYPERCHOLESTEROLEMIA 09/29/2008  . ANXIETY DEPRESSION 09/29/2008  . OBESITY 09/29/2008  . GERD 09/29/2008  . HYPERTENSION 09/29/2008    No echo data as of April, 2011 ( normal LV function by history)  . PEPTIC ULCER DISEASE 09/29/2008  . THYROTOXICOSIS 09/29/2008  . CHEST DISCOMFORT 05/30/2009    In October,2010  ,??GI??  . CAD 10/20/2008    Catheterization [facility] 2001, 70% circumflex / nuclear April, 2010 no ischemia, not gated with atrial  fib  . Cough 04/12/2009    Her lisinopril held October, 2010, cough resolved  . Warfarin anticoagulation   . Peptic ulcer disease   . Fluid overload     Improvement with Zaroxolyn July, 2011  . Shortness of breath     Amiodarone will not be restarted  /     Pulmonary illness November, 2010, abnormal CT, sedimentation rate not elevated, Dr Sherene Sires saw patient,, no desaturation walking April, 2011, plan to follow    Past Surgical History  Procedure Date  . Tonsillectomy and adenoidectomy 1953  . Dilation and curettage of uterus 2004    x2  . Hernia repair 2003    ROS Patient denies fever, chills, headache, sweats, rash, change in vision, change in hearing, chest pain, cough, nausea vomiting, urinary symptoms. She's having some mild sinus drainage. This is still related to pollen from her area.  PHYSICAL EXAM Patient is oriented to person time and place. Affect is normal. She is overweight. She stable. There is no jugulovenous distention. Lungs are clear. Respiratory effort is nonlabored. Cardiac exam reveals S1 and S2. There are no clicks or significant murmurs. The abdomen is soft. There is trace peripheral edema.  Filed Vitals:   12/05/11 1159  BP: 172/96  Pulse: 84  Height: 5\' 6"  (1.676 m)  Weight: 245 lb (111.131 kg)   EKG is done today. She has old atrial fibrillation with a controlled rate. There is no significant change. It is reviewed by me.  ASSESSMENT & PLAN

## 2011-12-05 NOTE — Patient Instructions (Signed)
Your physician wants you to follow-up in:  6 months. You will receive a reminder letter in the mail two months in advance. If you don't receive a letter, please call our office to schedule the follow-up appointment.   

## 2011-12-05 NOTE — Assessment & Plan Note (Signed)
Coronary disease is stable. No change in therapy. 

## 2011-12-05 NOTE — Assessment & Plan Note (Signed)
Patient continues on Coumadin and she is quite stable.

## 2011-12-05 NOTE — Assessment & Plan Note (Signed)
We have now (chronic atrial fibrillation. Rate is controlled. She is on Coumadin.

## 2011-12-05 NOTE — Assessment & Plan Note (Signed)
Patient is mildly volume overloaded today. It is important for her to get back to her regular Lasix dosing.

## 2012-05-14 ENCOUNTER — Ambulatory Visit: Payer: Medicare Other | Admitting: Cardiology

## 2012-06-10 ENCOUNTER — Ambulatory Visit (INDEPENDENT_AMBULATORY_CARE_PROVIDER_SITE_OTHER): Payer: Medicare Other | Admitting: Cardiology

## 2012-06-10 ENCOUNTER — Encounter: Payer: Self-pay | Admitting: Cardiology

## 2012-06-10 VITALS — BP 200/90 | HR 70 | Ht 66.0 in | Wt 245.0 lb

## 2012-06-10 DIAGNOSIS — I2510 Atherosclerotic heart disease of native coronary artery without angina pectoris: Secondary | ICD-10-CM

## 2012-06-10 DIAGNOSIS — I10 Essential (primary) hypertension: Secondary | ICD-10-CM

## 2012-06-10 DIAGNOSIS — E8779 Other fluid overload: Secondary | ICD-10-CM

## 2012-06-10 DIAGNOSIS — Z7901 Long term (current) use of anticoagulants: Secondary | ICD-10-CM

## 2012-06-10 DIAGNOSIS — E8770 Fluid overload, unspecified: Secondary | ICD-10-CM

## 2012-06-10 DIAGNOSIS — I4891 Unspecified atrial fibrillation: Secondary | ICD-10-CM

## 2012-06-10 NOTE — Assessment & Plan Note (Signed)
She continues on Coumadin for her atrial fib. No change in therapy. 

## 2012-06-10 NOTE — Assessment & Plan Note (Addendum)
Patient now is in chronic atrial fibrillation. We had used amiodarone in the past and then stopped it with plans to not restart it. She is anticoagulated.The rate is controlled.

## 2012-06-10 NOTE — Assessment & Plan Note (Signed)
The patient is not having any chest pain. She does not need any type of exercise testing at this time.

## 2012-06-10 NOTE — Assessment & Plan Note (Signed)
I am concerned about her blood pressure today. She is insistent that is well within the normal range at home and she checks it several times daily. I do believe that not having taken her diuretic this morning he is playing a role. She assures me that she will take it when she gets home. She will continue to check her blood pressure at home.

## 2012-06-10 NOTE — Assessment & Plan Note (Signed)
Her volume status appears to be stable when she is taking her diuretic daily.

## 2012-06-10 NOTE — Patient Instructions (Addendum)
Your physician wants you to follow-up in:  6 months. You will receive a reminder letter in the mail two months in advance. If you don't receive a letter, please call our office to schedule the follow-up appointment.   

## 2012-06-10 NOTE — Progress Notes (Signed)
HPI   Patient is seen today to followup hypertension and atrial fibrillation. I saw her last May, 2013. We made it clear that she should go back on her Lasix and take it every day. She has been doing this. She says that her blood pressure is normal at home. She is driven 2 hours today to come to [place]. She did not take her fluid pill this morning because of her travel. Her blood pressure is elevated while here today. In general she's doing well.  Allergies  Allergen Reactions  . Amiodarone   . Aspirin   . Atorvastatin   . Cholestyramine   . Clonidine Hydrochloride   . Lansoprazole   . Oxycodone-Acetaminophen   . Penicillins   . Prevalite (Cholestyramine Light)   . Propoxyphene Hcl   . Propoxyphene-Acetaminophen     Current Outpatient Prescriptions  Medication Sig Dispense Refill  . albuterol (VENTOLIN HFA) 108 (90 BASE) MCG/ACT inhaler Inhale 2 puffs into the lungs every 6 (six) hours as needed.        Marland Kitchen allopurinol (ZYLOPRIM) 300 MG tablet Take 300 mg by mouth daily.        Marland Kitchen diltiazem (CARDIZEM CD) 180 MG 24 hr capsule take 1 capsule by mouth once daily  90 capsule  3  . furosemide (LASIX) 80 MG tablet 1/2  Tab po  daily      . levothyroxine (SYNTHROID, LEVOTHROID) 150 MCG tablet Take 150 mcg by mouth daily.        Marland Kitchen lisinopril (PRINIVIL,ZESTRIL) 40 MG tablet Take 40 mg by mouth daily.        Marland Kitchen LORazepam (ATIVAN) 0.5 MG tablet Take 0.5 mg by mouth at bedtime.        . nebivolol (BYSTOLIC) 10 MG tablet Take 10 mg by mouth daily.        Marland Kitchen omeprazole (PRILOSEC) 20 MG capsule Take 20 mg by mouth daily.        Bertram Gala Glycol-Propyl Glycol (SYSTANE) 0.4-0.3 % SOLN as directed.        . potassium chloride SA (K-DUR,KLOR-CON) 20 MEQ tablet Take 20 mEq by mouth daily.        Marland Kitchen warfarin (COUMADIN) 5 MG tablet Take 5 mg by mouth as directed.          History   Social History  . Marital Status: Married    Spouse Name: N/A    Number of Children: N/A  . Years of Education:  N/A   Occupational History  . Not on file.   Social History Main Topics  . Smoking status: Former Smoker -- 1.0 packs/day for 25 years    Quit date: 07/09/1990  . Smokeless tobacco: Not on file  . Alcohol Use: No  . Drug Use: No  . Sexually Active: Not on file   Other Topics Concern  . Not on file   Social History Narrative  . No narrative on file    Family History  Problem Relation Age of Onset  . Breast cancer Maternal Aunt   . Lung cancer Mother     heavy smoker  . Heart disease Father   . Heart disease Maternal Grandfather     Past Medical History  Diagnosis Date  . Atrial fibrillation 09/29/2008    Paroxysmal, amiodarone therapy with cardioversion to sinus rhythm in the past. / cardioversion on higher dose amiodarone successful October, 2010 / amiodarone stopped or pulmonary illness November, 2010, patient improved, amiodarone not restarted / regular rhythm January,  2011 / returned atrial fibrillation by March, 2011... plan rate control  . RENAL INSUFFICIENCY 09/29/2008    Creatinine 1.9  . VENTRICULAR HYPERTROPHY, LEFT 09/29/2008  . PULMONARY HYPERTENSION 09/29/2008  . ALLERGIC RHINITIS 09/29/2008  . HYPERCHOLESTEROLEMIA 09/29/2008  . ANXIETY DEPRESSION 09/29/2008  . OBESITY 09/29/2008  . GERD 09/29/2008  . HYPERTENSION 09/29/2008    No echo data as of April, 2011 ( normal LV function by history)  . PEPTIC ULCER DISEASE 09/29/2008  . THYROTOXICOSIS 09/29/2008  . CHEST DISCOMFORT 05/30/2009    In October,2010  ,??GI??  . CAD 10/20/2008    Catheterization [facility] 2001, 70% circumflex / nuclear April, 2010 no ischemia, not gated with atrial fib  . Cough 04/12/2009    Her lisinopril held October, 2010, cough resolved  . Warfarin anticoagulation   . Peptic ulcer disease   . Fluid overload     Improvement with Zaroxolyn July, 2011  . Shortness of breath     Amiodarone will not be restarted  /     Pulmonary illness November, 2010, abnormal CT, sedimentation rate not elevated,  Dr Sherene Sires saw patient,, no desaturation walking April, 2011, plan to follow    Past Surgical History  Procedure Date  . Tonsillectomy and adenoidectomy 1953  . Dilation and curettage of uterus 2004    x2  . Hernia repair 2003    Patient Active Problem List  Diagnosis  . Warfarin anticoagulation  . OBESITY  . GERD  . HYPERTENSION  . Fluid overload  . HYPERTENSION  . CHEST DISCOMFORT  . CAD  . Cough  . Warfarin anticoagulation  . Fluid overload  . Shortness of breath  . Atrial fibrillation  . RENAL INSUFFICIENCY  . VENTRICULAR HYPERTROPHY, LEFT  . PULMONARY HYPERTENSION  . HYPERCHOLESTEROLEMIA  . ANXIETY DEPRESSION  . OBESITY  . GERD    ROS   Patient denies fever, chills, headache, sweats, rash, change in vision, change in hearing, chest pain, nausea vomiting, she does have a mild cough that she thinks is probably from a mild upper respiratory infection. All other systems are reviewed and are negative.  PHYSICAL EXAM  Patient is overweight. She is oriented to person time and place. Affect is normal. There is no jugulovenous distention. Lungs reveal a few scattered rhonchi. There is no respiratory distress. Cardiac exam reveals S1 and S2. There no clicks or significant murmurs. The abdomen is soft. There is trace peripheral edema.  Filed Vitals:   06/10/12 1107  BP: 200/90  Pulse: 70  Height: 5\' 6"  (1.676 m)  Weight: 245 lb (111.131 kg)   EKG is done today and reviewed by me. There is atrial fibrillation. The rate is controlled. No significant change.  ASSESSMENT & PLAN

## 2012-12-08 ENCOUNTER — Encounter: Payer: Self-pay | Admitting: Cardiology

## 2012-12-08 ENCOUNTER — Ambulatory Visit (INDEPENDENT_AMBULATORY_CARE_PROVIDER_SITE_OTHER): Payer: Medicare Other | Admitting: Cardiology

## 2012-12-08 VITALS — BP 170/98 | HR 90 | Ht 66.0 in | Wt 238.0 lb

## 2012-12-08 DIAGNOSIS — I2510 Atherosclerotic heart disease of native coronary artery without angina pectoris: Secondary | ICD-10-CM

## 2012-12-08 DIAGNOSIS — Z7901 Long term (current) use of anticoagulants: Secondary | ICD-10-CM

## 2012-12-08 DIAGNOSIS — I4891 Unspecified atrial fibrillation: Secondary | ICD-10-CM

## 2012-12-08 MED ORDER — DILTIAZEM HCL ER COATED BEADS 240 MG PO CP24: 240.0000 mg | ORAL_CAPSULE | Freq: Every day | ORAL | Status: AC

## 2012-12-08 NOTE — Patient Instructions (Addendum)
Increase Diltiazem to 240 mg daily.   Your physician has requested that you have an echocardiogram. Echocardiography is a painless test that uses sound waves to create images of your heart. It provides your doctor with information about the size and shape of your heart and how well your heart's chambers and valves are working. This procedure takes approximately one hour. There are no restrictions for this procedure.    Your physician recommends that you schedule a follow-up appointment in: late July or early August.Dr.Katz wants you to have echo and see him in same day.

## 2012-12-08 NOTE — Assessment & Plan Note (Signed)
She continues on Coumadin.  As part of today's evaluation I spent greater than 25 minutes with her total care. More than half of this time was spent with direct discussion with her about prior care and her current issues. Her diltiazem dose will be increased from 180-240 mg daily. I will then plan to see her back with plans to have a 2-D echo done the day of her visit.

## 2012-12-08 NOTE — Assessment & Plan Note (Signed)
Patient has 70% circumflex lesion by catheterization in 2001. Nuclear scan in April, 2010 revealed no ischemia. However we do not have LV function data from the study. It could not be gated because of her atrial fib at that time. Her coronary status is stable.

## 2012-12-08 NOTE — Assessment & Plan Note (Signed)
Patient has chronic atrial fibrillation. Her rate may be slightly increased. I will push her medicines a little higher.

## 2012-12-08 NOTE — Progress Notes (Signed)
HPI  Patient returns for followup of hypertension and atrial fibrillation. She's also had a problem with volume overload. Today I have spent an extensive amount of time re\re reviewing old records. I realize that there is no documentation over several years of her left ventricular function.  She continues to be hypertensive. Bystolic was tried by her primary physician on 2 occasions. She says that both times she had marked excessive decrease in blood pressure. Currently she feels fine. She says that her pressure runs in the range of 140 systolic at home.  Allergies  Allergen Reactions  . Amiodarone   . Aspirin   . Atorvastatin   . Bystolic (Nebivolol Hcl)     "Drops blood pressure too low"  . Cholestyramine   . Clonidine Hydrochloride   . Lansoprazole   . Oxycodone-Acetaminophen   . Penicillins   . Prevalite (Cholestyramine Light)   . Propoxyphene Hcl   . Propoxyphene-Acetaminophen     Current Outpatient Prescriptions  Medication Sig Dispense Refill  . albuterol (VENTOLIN HFA) 108 (90 BASE) MCG/ACT inhaler Inhale 2 puffs into the lungs every 6 (six) hours as needed.        Marland Kitchen allopurinol (ZYLOPRIM) 300 MG tablet Take 300 mg by mouth daily.        Marland Kitchen diltiazem (CARDIZEM CD) 180 MG 24 hr capsule take 1 capsule by mouth once daily  90 capsule  3  . furosemide (LASIX) 80 MG tablet 1/2  Tab po  daily      . levothyroxine (SYNTHROID, LEVOTHROID) 150 MCG tablet Take 150 mcg by mouth daily.        Marland Kitchen lisinopril (PRINIVIL,ZESTRIL) 40 MG tablet Take 40 mg by mouth daily.        Marland Kitchen LORazepam (ATIVAN) 0.5 MG tablet Take 0.5 mg by mouth at bedtime.        Marland Kitchen omeprazole (PRILOSEC) 20 MG capsule Take 20 mg by mouth daily.        Bertram Gala Glycol-Propyl Glycol (SYSTANE) 0.4-0.3 % SOLN as directed.        . potassium chloride SA (K-DUR,KLOR-CON) 20 MEQ tablet Take 20 mEq by mouth daily.        Marland Kitchen warfarin (COUMADIN) 5 MG tablet Take 5 mg by mouth as directed.         No current  facility-administered medications for this visit.    History   Social History  . Marital Status: Married    Spouse Name: N/A    Number of Children: N/A  . Years of Education: N/A   Occupational History  . Not on file.   Social History Main Topics  . Smoking status: Former Smoker -- 1.00 packs/day for 25 years    Quit date: 07/09/1990  . Smokeless tobacco: Not on file  . Alcohol Use: No  . Drug Use: No  . Sexually Active: Not on file   Other Topics Concern  . Not on file   Social History Narrative  . No narrative on file    Family History  Problem Relation Age of Onset  . Breast cancer Maternal Aunt   . Lung cancer Mother     heavy smoker  . Heart disease Father   . Heart disease Maternal Grandfather     Past Medical History  Diagnosis Date  . Atrial fibrillation 09/29/2008    Paroxysmal, amiodarone therapy with cardioversion to sinus rhythm in the past. / cardioversion on higher dose amiodarone successful October, 2010 / amiodarone stopped or pulmonary illness  November, 2010, patient improved, amiodarone not restarted / regular rhythm January, 2011 / returned atrial fibrillation by March, 2011... plan rate control  . RENAL INSUFFICIENCY 09/29/2008    Creatinine 1.9  . VENTRICULAR HYPERTROPHY, LEFT 09/29/2008  . PULMONARY HYPERTENSION 09/29/2008  . ALLERGIC RHINITIS 09/29/2008  . HYPERCHOLESTEROLEMIA 09/29/2008  . ANXIETY DEPRESSION 09/29/2008  . OBESITY 09/29/2008  . GERD 09/29/2008  . HYPERTENSION 09/29/2008    No echo data as of April, 2011 ( normal LV function by history)  . PEPTIC ULCER DISEASE 09/29/2008  . THYROTOXICOSIS 09/29/2008  . CHEST DISCOMFORT 05/30/2009    In October,2010  ,??GI??  . CAD 10/20/2008    Catheterization [facility] 2001, 70% circumflex / nuclear April, 2010 no ischemia, not gated with atrial fib  . Cough 04/12/2009    Her lisinopril held October, 2010, cough resolved  . Warfarin anticoagulation   . Peptic ulcer disease   . Fluid overload      Improvement with Zaroxolyn July, 2011  . Shortness of breath     Amiodarone will not be restarted  /     Pulmonary illness November, 2010, abnormal CT, sedimentation rate not elevated, Dr Sherene Sires saw patient,, no desaturation walking April, 2011, plan to follow    Past Surgical History  Procedure Laterality Date  . Tonsillectomy and adenoidectomy  1953  . Dilation and curettage of uterus  2004    x2  . Hernia repair  2003    Patient Active Problem List   Diagnosis Date Noted  . Warfarin anticoagulation   . Fluid overload   . Warfarin anticoagulation   . Fluid overload   . Shortness of breath   . CHEST DISCOMFORT 05/30/2009  . Cough 04/12/2009  . CAD 10/20/2008  . OBESITY 09/29/2008  . GERD 09/29/2008  . HYPERTENSION 09/29/2008  . Atrial fibrillation 09/29/2008  . RENAL INSUFFICIENCY 09/29/2008  . VENTRICULAR HYPERTROPHY, LEFT 09/29/2008  . PULMONARY HYPERTENSION 09/29/2008  . HYPERCHOLESTEROLEMIA 09/29/2008  . ANXIETY DEPRESSION 09/29/2008  . OBESITY 09/29/2008  . GERD 09/29/2008    ROS   Patient denies fever, chills, headache, sweats, rash, change in vision, change in hearing, chest pain, cough, nausea vomiting, urinary symptoms. All other systems are reviewed and are negative. She does mention that she sometimes feel that she has a problem with balance. She's not had any falls. She's not had syncope or presyncope.  PHYSICAL EXAM   Patient is overweight. She is oriented to person time and place. Affect is normal. There is no jugular venous distention. Lungs are clear. Respiratory effort is nonlabored. Cardiac exam reveals S1 and S2. The rhythm is irregularly irregular. The abdomen is soft. Is no peripheral edema. There no musculoskeletal deformities. There are no skin rashes.  Filed Vitals:   12/08/12 1353  BP: 170/98  Pulse: 90  Height: 5\' 6"  (1.676 m)  Weight: 238 lb (107.956 kg)  SpO2: 97%     ASSESSMENT & PLAN

## 2012-12-08 NOTE — Assessment & Plan Note (Signed)
Her volume status is stable. No change in therapy. We will need to obtain more data better LV function.

## 2013-02-17 ENCOUNTER — Other Ambulatory Visit: Payer: Self-pay

## 2013-02-17 ENCOUNTER — Ambulatory Visit (INDEPENDENT_AMBULATORY_CARE_PROVIDER_SITE_OTHER): Payer: Medicare Other | Admitting: Cardiology

## 2013-02-17 ENCOUNTER — Ambulatory Visit (HOSPITAL_COMMUNITY): Payer: Medicare Other | Attending: Cardiology | Admitting: Radiology

## 2013-02-17 ENCOUNTER — Encounter: Payer: Self-pay | Admitting: Cardiology

## 2013-02-17 VITALS — BP 136/80 | HR 75 | Ht 66.0 in | Wt 241.0 lb

## 2013-02-17 DIAGNOSIS — I4891 Unspecified atrial fibrillation: Secondary | ICD-10-CM

## 2013-02-17 DIAGNOSIS — Z7901 Long term (current) use of anticoagulants: Secondary | ICD-10-CM

## 2013-02-17 DIAGNOSIS — I2510 Atherosclerotic heart disease of native coronary artery without angina pectoris: Secondary | ICD-10-CM

## 2013-02-17 DIAGNOSIS — I509 Heart failure, unspecified: Secondary | ICD-10-CM

## 2013-02-17 DIAGNOSIS — I10 Essential (primary) hypertension: Secondary | ICD-10-CM

## 2013-02-17 DIAGNOSIS — I5032 Chronic diastolic (congestive) heart failure: Secondary | ICD-10-CM

## 2013-02-17 NOTE — Patient Instructions (Addendum)
Your physician recommends that you continue on your current medications as directed. Please refer to the Current Medication list given to you today.  Your physician wants you to follow-up in: 6 months. You will receive a reminder letter in the mail two months in advance. If you don't receive a letter, please call our office to schedule the follow-up appointment.  

## 2013-02-17 NOTE — Assessment & Plan Note (Signed)
The patient's volume overload is related atrial fibrillation and chronic diastolic CHF. She is under good control at this time. No further workup.

## 2013-02-17 NOTE — Assessment & Plan Note (Signed)
Coronary disease is stable. No change in therapy. 

## 2013-02-17 NOTE — Assessment & Plan Note (Signed)
Blood pressures control. No change in therapy. 

## 2013-02-17 NOTE — Progress Notes (Signed)
HPI  Patient is seen today to followup age of fibrillation and diastolic dysfunction. There is also history of coronary disease. When I saw the patient last I increased the dose of diltiazem. Plans were made for follow up 2-D echo. Since the visit she was on a vacation and did well. She is here today and had her 2-D echo. I personally reviewed this study. Her EF is 60%. She has atrial fibrillation. The data is suggestive of diastolic dysfunction with dilated atria. Diastolic function cannot be fully assessed with the atrial fibrillation. Overall she's feeling well.  Allergies  Allergen Reactions  . Amiodarone   . Aspirin   . Atorvastatin   . Bystolic (Nebivolol Hcl)     "Drops blood pressure too low"  . Cholestyramine   . Clonidine Hydrochloride   . Lansoprazole   . Oxycodone-Acetaminophen   . Penicillins   . Prevalite (Cholestyramine Light)   . Propoxyphene Hcl   . Propoxyphene-Acetaminophen     Current Outpatient Prescriptions  Medication Sig Dispense Refill  . allopurinol (ZYLOPRIM) 300 MG tablet Take 300 mg by mouth daily.        Marland Kitchen diltiazem (CARDIZEM CD) 240 MG 24 hr capsule Take 1 capsule (240 mg total) by mouth daily.  90 capsule  3  . furosemide (LASIX) 80 MG tablet 1/2  Tab po  daily      . levothyroxine (SYNTHROID, LEVOTHROID) 150 MCG tablet Take 150 mcg by mouth daily.        Marland Kitchen lisinopril (PRINIVIL,ZESTRIL) 40 MG tablet Take 40 mg by mouth daily.        . minoxidil (LONITEN) 2.5 MG tablet Take 1 tablet by mouth daily.      Marland Kitchen omeprazole (PRILOSEC) 20 MG capsule Take 20 mg by mouth daily.        . potassium chloride SA (K-DUR,KLOR-CON) 20 MEQ tablet Take 20 mEq by mouth daily.        . traZODone (DESYREL) 50 MG tablet Take 1 tablet by mouth every evening.      . warfarin (COUMADIN) 5 MG tablet Take 5 mg by mouth as directed.         No current facility-administered medications for this visit.    History   Social History  . Marital Status: Married    Spouse Name:  N/A    Number of Children: N/A  . Years of Education: N/A   Occupational History  . Not on file.   Social History Main Topics  . Smoking status: Former Smoker -- 1.00 packs/day for 25 years    Quit date: 07/09/1990  . Smokeless tobacco: Not on file  . Alcohol Use: No  . Drug Use: No  . Sexually Active: Not on file   Other Topics Concern  . Not on file   Social History Narrative  . No narrative on file    Family History  Problem Relation Age of Onset  . Breast cancer Maternal Aunt   . Lung cancer Mother     heavy smoker  . Heart disease Father   . Heart disease Maternal Grandfather     Past Medical History  Diagnosis Date  . Atrial fibrillation 09/29/2008    Paroxysmal, amiodarone therapy with cardioversion to sinus rhythm in the past. / cardioversion on higher dose amiodarone successful October, 2010 / amiodarone stopped or pulmonary illness November, 2010, patient improved, amiodarone not restarted / regular rhythm January, 2011 / returned atrial fibrillation by March, 2011... plan rate control  .  RENAL INSUFFICIENCY 09/29/2008    Creatinine 1.9  . VENTRICULAR HYPERTROPHY, LEFT 09/29/2008  . PULMONARY HYPERTENSION 09/29/2008  . ALLERGIC RHINITIS 09/29/2008  . HYPERCHOLESTEROLEMIA 09/29/2008  . ANXIETY DEPRESSION 09/29/2008  . OBESITY 09/29/2008  . GERD 09/29/2008  . HYPERTENSION 09/29/2008    No echo data as of April, 2011 ( normal LV function by history)  . PEPTIC ULCER DISEASE 09/29/2008  . THYROTOXICOSIS 09/29/2008  . CHEST DISCOMFORT 05/30/2009    In October,2010  ,??GI??  . CAD 10/20/2008    Catheterization [facility] 2001, 70% circumflex / nuclear April, 2010 no ischemia, not gated with atrial fib  . Cough 04/12/2009    Her lisinopril held October, 2010, cough resolved  . Warfarin anticoagulation   . Peptic ulcer disease   . Fluid overload     Improvement with Zaroxolyn July, 2011  . Shortness of breath     Amiodarone will not be restarted  /     Pulmonary illness  November, 2010, abnormal CT, sedimentation rate not elevated, Dr Sherene Sires saw patient,, no desaturation walking April, 2011, plan to follow    Past Surgical History  Procedure Laterality Date  . Tonsillectomy and adenoidectomy  1953  . Dilation and curettage of uterus  2004    x2  . Hernia repair  2003    Patient Active Problem List   Diagnosis Date Noted  . Warfarin anticoagulation   . Fluid overload   . Shortness of breath   . CHEST DISCOMFORT 05/30/2009  . Cough 04/12/2009  . CAD 10/20/2008  . OBESITY 09/29/2008  . GERD 09/29/2008  . HYPERTENSION 09/29/2008  . Atrial fibrillation 09/29/2008  . RENAL INSUFFICIENCY 09/29/2008  . VENTRICULAR HYPERTROPHY, LEFT 09/29/2008  . PULMONARY HYPERTENSION 09/29/2008  . HYPERCHOLESTEROLEMIA 09/29/2008  . ANXIETY DEPRESSION 09/29/2008  . OBESITY 09/29/2008  . GERD 09/29/2008    ROS   Patient denies fever, chills, headache, sweats, rash, change in vision, change in hearing, chest pain, cough, nausea vomiting, urinary symptoms. All other systems are reviewed and are negative.  PHYSICAL EXAM  Patient is feeling well. There is no jugular venous distention. Lungs are clear. Respiratory effort is nonlabored. Cardiac exam reveals S1 and S2. There no clicks or significant murmurs. The rhythm is irregularly irregular. The abdomen is soft. There is no peripheral edema.  Filed Vitals:   02/17/13 1117  BP: 136/80  Pulse: 75  Height: 5\' 6"  (1.676 m)  Weight: 241 lb (109.317 kg)   EKG is done today and reviewed by me. Atrial fibrillation continues. The rate is under good control.  ASSESSMENT & PLAN

## 2013-02-17 NOTE — Assessment & Plan Note (Signed)
Good rate control on her current medications. No change in therapy.

## 2013-02-17 NOTE — Progress Notes (Signed)
Echocardiogram performed.  

## 2014-11-08 ENCOUNTER — Encounter: Payer: Self-pay | Admitting: Cardiology

## 2014-11-08 DIAGNOSIS — R9430 Abnormal result of cardiovascular function study, unspecified: Secondary | ICD-10-CM | POA: Insufficient documentation

## 2014-11-08 DIAGNOSIS — I358 Other nonrheumatic aortic valve disorders: Secondary | ICD-10-CM | POA: Insufficient documentation

## 2014-11-10 ENCOUNTER — Ambulatory Visit: Payer: Medicare Other | Admitting: Cardiology

## 2014-12-08 ENCOUNTER — Ambulatory Visit: Payer: Medicare Other | Admitting: Cardiology

## 2015-03-01 ENCOUNTER — Ambulatory Visit (INDEPENDENT_AMBULATORY_CARE_PROVIDER_SITE_OTHER): Payer: Medicare Other | Admitting: Internal Medicine

## 2015-03-01 ENCOUNTER — Encounter: Payer: Self-pay | Admitting: Internal Medicine

## 2015-03-01 VITALS — BP 134/70 | HR 71 | Ht 66.0 in | Wt 190.0 lb

## 2015-03-01 DIAGNOSIS — R29898 Other symptoms and signs involving the musculoskeletal system: Secondary | ICD-10-CM | POA: Diagnosis not present

## 2015-03-01 DIAGNOSIS — I482 Chronic atrial fibrillation, unspecified: Secondary | ICD-10-CM

## 2015-03-01 DIAGNOSIS — R918 Other nonspecific abnormal finding of lung field: Secondary | ICD-10-CM

## 2015-03-01 DIAGNOSIS — C3431 Malignant neoplasm of lower lobe, right bronchus or lung: Secondary | ICD-10-CM | POA: Insufficient documentation

## 2015-03-01 NOTE — Assessment & Plan Note (Signed)
Based on CT chest and smoking hx this is lung ca until proven otherwise and strongly doubt resectable so needs pet then ct bx and refer to [place] next.  She lives 100 miles away so will need co-ordination of care to be sure we do the most we can for her while in [place]   Discussed in detail all the  indications, usual  risks and alternatives  relative to the benefits with patient and husband who agree  to proceed with w/u as outlined  Total time = 22mreview case with pt/ discussion/ counseling/ giving and going over instructions (see avs)

## 2015-03-01 NOTE — Progress Notes (Signed)
   Subjective:    Patient ID: Kristy [name], female    DOB: 08-25-1945      MRN: [id number]  HPI    20 yowf  Quit smoking in 1992 referred to pulmonary clinic 03/01/2015 by DR Thomes Dinning in [place] for eval of R lung mass     03/01/2015 1st [facility] office visit/ Kristy [name]   Chief Complaint  Patient presents with  . Pulmonary Consult    Referred by Dr. Thomes Dinning for eval of lung mass.   denies any complaints related to lung mass but is having gen malaise and bilateral leg weakness/ constipation and back pain with no obvious extra thoracic dz (including spine) by CT abd/ pelvis dated 02/24/15  No obvious day to day or daytime variabilty or assoc  Limiting sob/ hemoptysis (despite on coumadin) chronic cough or cp or chest tightness, subjective wheeze overt sinus or hb symptoms. No unusual exp hx or h/o childhood pna/ asthma or knowledge of premature birth.  Sleeping ok without nocturnal  or early am exacerbation  of respiratory  c/o's or need for noct saba. Also denies any obvious fluctuation of symptoms with weather or environmental changes or other aggravating or alleviating factors except as outlined above   Current Medications, Allergies, Complete Past Medical History, Past Surgical History, Family History, and Social History were reviewed in [employer] record.              Review of Systems  Constitutional: Negative for fever, chills and unexpected weight change.  HENT: Negative for congestion, dental problem, ear pain, nosebleeds, postnasal drip, rhinorrhea, sinus pressure, sneezing, sore throat, trouble swallowing and voice change.   Eyes: Negative for visual disturbance.  Respiratory: Negative for cough, choking and shortness of breath.   Cardiovascular: Negative for chest pain and leg swelling.  Gastrointestinal: Negative for vomiting, abdominal pain and diarrhea.  Genitourinary: Negative for difficulty urinating.  Musculoskeletal: Negative  for arthralgias.  Skin: Negative for rash.  Neurological: Negative for tremors, syncope and headaches.  Hematological: Does not bruise/bleed easily.       Objective:   Physical Exam  W/c bound wf  nad  Wt Readings from Last 3 Encounters:  03/01/15 190 lb (86.183 kg)  02/17/13 241 lb (109.317 kg)  12/08/12 238 lb (107.956 kg)    Vital signs reviewed    HEENT: nl dentition, turbinates, and orophanx. Nl external ear canals without cough reflex   NECK :  without JVD/Nodes/TM/ nl carotid upstrokes bilaterally   LUNGS: no acc muscle use, clear to A and P bilaterally without cough on insp or exp maneuvers   CV:  RRR  no s3 or murmur or increase in P2, no edema   ABD:  soft and nontender with nl excursion in the supine position. No bruits or organomegaly, bowel sounds nl  MS:  warm without deformities, calf tenderness, cyanosis or clubbing  SKIN: warm and dry without lesions    NEURO:  alert, approp, able to stand from w/c but uses arms to help stand. No clonus or hyper-reflexia noted             CT chest [place] dated 02/24/15 reviewed images and agree with imp: 6 cm mass sup segment RLL abuts pleura with 2.5 cm pretracheal node       Assessment & Plan:

## 2015-03-01 NOTE — Assessment & Plan Note (Signed)
Concerned about spinal mets but nothing on CT abd/pelvis study > proceed with PET and neuro eval prn worsening > admit

## 2015-03-01 NOTE — Patient Instructions (Signed)
Please see patient coordinator before you leave today  to schedule PET scan and CT bx same day.   You will need to stop coumadin 5 days prior to biopsy  If neurologic problem gets any worse you will need to be admitted to the hospital

## 2015-03-01 NOTE — Assessment & Plan Note (Signed)
Advised to hold coumadin x 5 days prior to bx with slt increased risk cva off coumadin acknowledged.

## 2015-03-08 ENCOUNTER — Other Ambulatory Visit: Payer: Self-pay | Admitting: Radiology

## 2015-03-08 ENCOUNTER — Ambulatory Visit (HOSPITAL_COMMUNITY)
Admission: RE | Admit: 2015-03-08 | Discharge: 2015-03-08 | Disposition: A | Discharge status: Home / Self Care | Payer: Medicare Other | Source: Ambulatory Visit | Attending: Internal Medicine | Admitting: Internal Medicine

## 2015-03-08 DIAGNOSIS — E780 Pure hypercholesterolemia: Secondary | ICD-10-CM | POA: Insufficient documentation

## 2015-03-08 DIAGNOSIS — I2510 Atherosclerotic heart disease of native coronary artery without angina pectoris: Secondary | ICD-10-CM | POA: Diagnosis not present

## 2015-03-08 DIAGNOSIS — Z79899 Other long term (current) drug therapy: Secondary | ICD-10-CM | POA: Insufficient documentation

## 2015-03-08 DIAGNOSIS — I4891 Unspecified atrial fibrillation: Secondary | ICD-10-CM | POA: Diagnosis not present

## 2015-03-08 DIAGNOSIS — Z801 Family history of malignant neoplasm of trachea, bronchus and lung: Secondary | ICD-10-CM | POA: Insufficient documentation

## 2015-03-08 DIAGNOSIS — C771 Secondary and unspecified malignant neoplasm of intrathoracic lymph nodes: Secondary | ICD-10-CM | POA: Insufficient documentation

## 2015-03-08 DIAGNOSIS — I10 Essential (primary) hypertension: Secondary | ICD-10-CM | POA: Insufficient documentation

## 2015-03-08 DIAGNOSIS — Z87891 Personal history of nicotine dependence: Secondary | ICD-10-CM | POA: Diagnosis not present

## 2015-03-08 DIAGNOSIS — Z880 Allergy status to penicillin: Secondary | ICD-10-CM | POA: Insufficient documentation

## 2015-03-08 DIAGNOSIS — C3431 Malignant neoplasm of lower lobe, right bronchus or lung: Secondary | ICD-10-CM | POA: Diagnosis not present

## 2015-03-08 DIAGNOSIS — N289 Disorder of kidney and ureter, unspecified: Secondary | ICD-10-CM | POA: Diagnosis not present

## 2015-03-08 DIAGNOSIS — R918 Other nonspecific abnormal finding of lung field: Secondary | ICD-10-CM

## 2015-03-08 DIAGNOSIS — K219 Gastro-esophageal reflux disease without esophagitis: Secondary | ICD-10-CM | POA: Insufficient documentation

## 2015-03-08 MED ORDER — FLUDEOXYGLUCOSE F - 18 (FDG) INJECTION
9.3000 | Freq: Once | INTRAVENOUS | Status: DC | PRN
  Administered 2015-03-08: 9.3 via INTRAVENOUS
  Filled 2015-03-08: qty 9.3

## 2015-03-09 ENCOUNTER — Ambulatory Visit (HOSPITAL_COMMUNITY): Admission: RE | Admit: 2015-03-09 | Payer: Medicare Other | Source: Ambulatory Visit

## 2015-03-09 ENCOUNTER — Encounter (HOSPITAL_COMMUNITY): Payer: Self-pay

## 2015-03-09 ENCOUNTER — Ambulatory Visit (HOSPITAL_COMMUNITY)
Admission: RE | Admit: 2015-03-09 | Discharge: 2015-03-09 | Disposition: A | Discharge status: Home / Self Care | Payer: Medicare Other | Source: Ambulatory Visit | Attending: Interventional Radiology | Admitting: Interventional Radiology

## 2015-03-09 ENCOUNTER — Ambulatory Visit (HOSPITAL_COMMUNITY)
Admission: RE | Admit: 2015-03-09 | Discharge: 2015-03-09 | Disposition: A | Discharge status: Home / Self Care | Payer: Medicare Other | Source: Ambulatory Visit | Attending: Internal Medicine | Admitting: Internal Medicine

## 2015-03-09 DIAGNOSIS — R918 Other nonspecific abnormal finding of lung field: Secondary | ICD-10-CM

## 2015-03-09 DIAGNOSIS — J95811 Postprocedural pneumothorax: Secondary | ICD-10-CM

## 2015-03-09 DIAGNOSIS — C3431 Malignant neoplasm of lower lobe, right bronchus or lung: Secondary | ICD-10-CM | POA: Diagnosis not present

## 2015-03-09 LAB — CBC WITH DIFFERENTIAL/PLATELET
BASOS ABS: 0 10*3/uL (ref 0.0–0.1)
BASOS PCT: 1 % (ref 0–1)
Eosinophils Absolute: 0.1 10*3/uL (ref 0.0–0.7)
Eosinophils Relative: 1 % (ref 0–5)
HEMATOCRIT: 40.6 % (ref 36.0–46.0)
HEMOGLOBIN: 14.1 g/dL (ref 12.0–15.0)
LYMPHS PCT: 4 % — AB (ref 12–46)
Lymphs Abs: 0.3 10*3/uL — ABNORMAL LOW (ref 0.7–4.0)
MCH: 30.1 pg (ref 26.0–34.0)
MCHC: 34.7 g/dL (ref 30.0–36.0)
MCV: 86.8 fL (ref 78.0–100.0)
Monocytes Absolute: 0.6 10*3/uL (ref 0.1–1.0)
Monocytes Relative: 8 % (ref 3–12)
NEUTROS ABS: 6.8 10*3/uL (ref 1.7–7.7)
NEUTROS PCT: 86 % — AB (ref 43–77)
Platelets: 229 10*3/uL (ref 150–400)
RBC: 4.68 MIL/uL (ref 3.87–5.11)
RDW: 15.5 % (ref 11.5–15.5)
WBC: 7.9 10*3/uL (ref 4.0–10.5)

## 2015-03-09 LAB — BASIC METABOLIC PANEL
ANION GAP: 9 (ref 5–15)
BUN: 33 mg/dL — ABNORMAL HIGH (ref 6–20)
CALCIUM: 9.6 mg/dL (ref 8.9–10.3)
CO2: 24 mmol/L (ref 22–32)
Chloride: 99 mmol/L — ABNORMAL LOW (ref 101–111)
Creatinine, Ser: 1.5 mg/dL — ABNORMAL HIGH (ref 0.44–1.00)
GFR, EST AFRICAN AMERICAN: 39 mL/min — AB (ref >60)
GFR, EST NON AFRICAN AMERICAN: 34 mL/min — AB (ref >60)
GLUCOSE: 129 mg/dL — AB (ref 65–99)
POTASSIUM: 4.4 mmol/L (ref 3.5–5.1)
Sodium: 132 mmol/L — ABNORMAL LOW (ref 135–145)

## 2015-03-09 LAB — PROTIME-INR
INR: 1.09 (ref 0.00–1.49)
Prothrombin Time: 14.3 seconds (ref 11.6–15.2)

## 2015-03-09 LAB — GLUCOSE, CAPILLARY: GLUCOSE-CAPILLARY: 121 mg/dL — AB (ref 65–99)

## 2015-03-09 LAB — APTT: APTT: 30 s (ref 24–37)

## 2015-03-09 MED ORDER — FENTANYL CITRATE (PF) 100 MCG/2ML IJ SOLN
INTRAMUSCULAR | Status: AC | PRN
  Administered 2015-03-09: 25 ug via INTRAVENOUS

## 2015-03-09 MED ORDER — MIDAZOLAM HCL 2 MG/2ML IJ SOLN
INTRAMUSCULAR | Status: AC
  Filled 2015-03-09: qty 2

## 2015-03-09 MED ORDER — ATENOLOL 100 MG PO TABS
100.0000 mg | ORAL_TABLET | ORAL | Status: AC
  Administered 2015-03-09: 100 mg via ORAL
  Filled 2015-03-09: qty 1

## 2015-03-09 MED ORDER — FENTANYL CITRATE (PF) 100 MCG/2ML IJ SOLN
25.0000 ug | Freq: Once | INTRAMUSCULAR | Status: AC
  Administered 2015-03-09: 25 ug via INTRAVENOUS
  Filled 2015-03-09: qty 2

## 2015-03-09 MED ORDER — FENTANYL CITRATE (PF) 100 MCG/2ML IJ SOLN
INTRAMUSCULAR | Status: AC
  Filled 2015-03-09: qty 2

## 2015-03-09 MED ORDER — HYDRALAZINE HCL 20 MG/ML IJ SOLN
INTRAMUSCULAR | Status: AC
  Filled 2015-03-09: qty 1

## 2015-03-09 MED ORDER — SODIUM CHLORIDE 0.9 % IV SOLN
INTRAVENOUS | Status: DC
  Administered 2015-03-09: 07:00:00 via INTRAVENOUS

## 2015-03-09 MED ORDER — MIDAZOLAM HCL 2 MG/2ML IJ SOLN
INTRAMUSCULAR | Status: AC | PRN
  Administered 2015-03-09 (×2): 0.5 mg via INTRAVENOUS

## 2015-03-09 MED ORDER — HYDRALAZINE HCL 20 MG/ML IJ SOLN
INTRAMUSCULAR | Status: AC | PRN
  Administered 2015-03-09: 10 mg via INTRAVENOUS

## 2015-03-09 MED ORDER — LISINOPRIL 40 MG PO TABS
40.0000 mg | ORAL_TABLET | ORAL | Status: AC
  Administered 2015-03-09: 40 mg via ORAL
  Filled 2015-03-09: qty 1

## 2015-03-09 NOTE — Progress Notes (Signed)
Notified dr Kathlene Cote of cxr results

## 2015-03-09 NOTE — Discharge Instructions (Signed)
Needle Biopsy of Lung, Care After Refer to this sheet in the next few weeks. These instructions provide you with information on caring for yourself after your procedure. Your health care provider may also give you more specific instructions. Your treatment has been planned according to current medical practices, but problems sometimes occur. Call your health care provider if you have any problems or questions after your procedure. WHAT TO EXPECT AFTER THE PROCEDURE  A bandage will be applied over the area where the needle was inserted. You may be asked to apply pressure to the bandage for several minutes to ensure there is minimal bleeding.  In most cases, you can leave when your needle biopsy procedure is completed. Do not drive yourself home. Someone else should take you home.  If you received an IV sedative or general anesthetic, you will be taken to a comfortable place to relax while the medicine wears off.  If you have upcoming travel scheduled, talk to your health care provider about when it is safe to travel by air after the procedure. HOME CARE INSTRUCTIONS  Expect to take it easy for the rest of the day.  Protect the area where you received the needle biopsy by keeping the bandage in place for as long as instructed.  You may feel some mild pain or discomfort in the area, but this should stop in a day or two.  Take medicines only as directed by your health care provider. SEEK MEDICAL CARE IF:   You have pain at the biopsy site that worsens or is not helped by medicine.  You have swelling or drainage at the needle biopsy site.  You have a fever. SEEK IMMEDIATE MEDICAL CARE IF:   You have new or worsening shortness of breath.  You have chest pain.  You are coughing up blood.  You have bleeding that does not stop with pressure or a bandage.  You develop light-headedness or fainting. Document Released: 04/22/2007 Document Revised: 11/09/2013 Document Reviewed:  11/17/2012 [facility] Patient Information 2015 [place], [place]. This information is not intended to replace advice given to you by your health care provider. Make sure you discuss any questions you have with your health care provider. Lung Biopsy A lung biopsy is a procedure in which a tissue sample is removed from the lung. The tissue can be examined under a microscope to help diagnose various lung disorders.  LET [facility] CARE PROVIDER KNOW ABOUT:  Any allergies you have.  All medicines you are taking, including vitamins, herbs, eye drops, creams, and over-the-counter medicines.  Previous problems you or members of your family have had with the use of anesthetics.  Any blood disorders or bleeding problems that you have.  Previous surgeries you have had.  Medical conditions you have. RISKS AND COMPLICATIONS Generally, a lung biopsy is a safe procedure. However, problems can occur and include:  Collapse of the lung.   Bleeding.   Infection.  BEFORE THE PROCEDURE  Do not eat or drink anything after midnight on the night before the procedure or as directed by your health care provider.  Ask your health care provider about changing or stopping your regular medicines. This is especially important if you are taking diabetes medicines or blood thinners.  Plan to have someone take you home after the procedure. PROCEDURE Various methods can be used to perform a lung biopsy:   Needle biopsy. A biopsy needle is inserted into the lung. The needle is used to collect the tissue sample. A CT  scanner may be used to guide the needle to the right place in the lung. For this method, a medicine is used to numb the area where the biopsy sample will be taken (local anesthetic). °· Bronchoscopy. A flexible tube (bronchoscope) is inserted into your lungs by going through your mouth or nose. A needle or forceps is passed through the bronchoscope to remove the tissue sample. For this method, medicine  may be used to numb the back of your throat. °· Open biopsy. A cut (incision) is made in your chest. The tissue sample is then removed using surgical tools. The incision is closed with skin glue, skin adhesive strips, or stitches. For this method, you will be given medicine to make you sleep through the procedure (general anesthetic). °AFTER THE PROCEDURE °· Your recovery will be assessed and monitored. °· You might have soreness and tenderness at the site of the biopsy for a few days after the procedure. °· You might have a cough and some soreness in your throat for a few days if a bronchoscope was used. °Document Released: 09/13/2004 Document Revised: 11/09/2013 Document Reviewed: 12/07/2012 °Exit[employer]® Patient Information ©2015 [employer]. This information is not intended to replace advice given to you by your health care provider. Make sure you discuss any questions you have with your health care provider. °Conscious Sedation °Sedation is the use of medicines to promote relaxation and relieve discomfort and anxiety. Conscious sedation is a type of sedation. Under conscious sedation you are less alert than normal but are still able to respond to instructions or stimulation. Conscious sedation is used during short medical and dental procedures. It is milder than deep sedation or general anesthesia and allows you to return to your regular activities sooner.  °LET YOUR HEALTH CARE PROVIDER KNOW ABOUT:  °· Any allergies you have. °· All medicines you are taking, including vitamins, herbs, eye drops, creams, and over-the-counter medicines. °· Use of steroids (by mouth or creams). °· Previous problems you or members of your family have had with the use of anesthetics. °· Any blood disorders you have. °· Previous surgeries you have had. °· Medical conditions you have. °· Possibility of pregnancy, if this applies. °· Use of cigarettes, alcohol, or illegal drugs. °RISKS AND COMPLICATIONS °Generally, this is a safe  procedure. However, as with any procedure, problems can occur. Possible problems include: °· Oversedation. °· Trouble breathing on your own. You may need to have a breathing tube until you are awake and breathing on your own. °· Allergic reaction to any of the medicines used for the procedure. °BEFORE THE PROCEDURE °· You may have blood tests done. These tests can help show how well your kidneys and liver are working. They can also show how well your blood clots. °· A physical exam will be done.   °· Only take medicines as directed by your health care provider. You may need to stop taking medicines (such as blood thinners, aspirin, or nonsteroidal anti-inflammatory drugs) before the procedure.   °· Do not eat or drink at least 6 hours before the procedure or as directed by your health care provider. °· Arrange for a responsible adult, family member, or friend to take you home after the procedure. He or she should stay with you for at least 24 hours after the procedure, until the medicine has worn off. °PROCEDURE  °· An intravenous (IV) catheter will be inserted into one of your veins. Medicine will be able to flow directly into your body through this catheter. You may   be given medicine through this tube to help prevent pain and help you relax. °· The medical or dental procedure will be done. °AFTER THE PROCEDURE °· You will stay in a recovery area until the medicine has worn off. Your blood pressure and pulse will be checked.   °·  Depending on the procedure you had, you may be allowed to go home when you can tolerate liquids and your pain is under control. °Document Released: 03/20/2001 Document Revised: 06/30/2013 Document Reviewed: 03/02/2013 °ExitCare® Patient Information ©2015 [employer]. This information is not intended to replace advice given to you by your health care provider. Make sure you discuss any questions you have with your health care provider. ° °

## 2015-03-09 NOTE — Procedures (Signed)
Interventional Radiology Procedure Note  Procedure:  CT guided core biopsy of RLL lung mass  Complications:  Tiny posterior PTX  Estimated Blood Loss: < 10 mL  6 cm posterior RLL mass sampled via 17 G needle.  18 G core biopsy x 3. Tiny posterior PTX on post biopsy CT imaging.  Will f/u with CXR during recovery.  Venetia Night. Kathlene Cote, M.D Pager:  [phone]

## 2015-03-09 NOTE — H&P (Signed)
Chief Complaint: Patient was seen in consultation today for back pain; wt loss; Rt lung mass at the request of Wert,Michael B  Referring Physician(s): Wert,Michael B  History of Present Illness: Kristy [name] is a 69 y.o. female   Wt loss; increasing back pain Work up revealed Rt Lung mass Referred to Dr Melvyn Novas +PET 03/08/15:  IMPRESSION: 1. 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 2. Hypermetabolic mediastinal nodal metastasis to the RIGHT paratracheal nodal station as well a paraesophageal nodal metastasis inferior to carina. 3. Metastatic adenopathy to the RIGHT hilum. 4. Single focus of metabolic activity associated with the anterior RIGHT sixth rib without CT abnormality. Recommend correlation with trauma and if no such history, consider a solitary metastatic Lesion.  Now scheduled for Rt lung mass biopsy  Past Medical History  Diagnosis Date  . Atrial fibrillation 09/29/2008    Paroxysmal, amiodarone therapy with cardioversion to sinus rhythm in the past. / cardioversion on higher dose amiodarone successful October, 2010 / amiodarone stopped or pulmonary illness November, 2010, patient improved, amiodarone not restarted / regular rhythm January, 2011 / returned atrial fibrillation by March, 2011... plan rate control  . RENAL INSUFFICIENCY 09/29/2008    Creatinine 1.9  . VENTRICULAR HYPERTROPHY, LEFT 09/29/2008  . PULMONARY HYPERTENSION 09/29/2008  . ALLERGIC RHINITIS 09/29/2008  . HYPERCHOLESTEROLEMIA 09/29/2008  . ANXIETY DEPRESSION 09/29/2008  . OBESITY 09/29/2008  . GERD 09/29/2008  . HYPERTENSION 09/29/2008    No echo data as of April, 2011 ( normal LV function by history)  . PEPTIC ULCER DISEASE 09/29/2008  . THYROTOXICOSIS 09/29/2008  . CHEST DISCOMFORT 05/30/2009    In October,2010  ,??GI??  . CAD 10/20/2008    Catheterization [facility] 2001, 70% circumflex / nuclear April, 2010 no ischemia, not gated with atrial fib  .  Cough 04/12/2009    Her lisinopril held October, 2010, cough resolved  . Warfarin anticoagulation   . Peptic ulcer disease   . Fluid overload     Improvement with Zaroxolyn July, 2011  . Shortness of breath     Amiodarone will not be restarted  /     Pulmonary illness November, 2010, abnormal CT, sedimentation rate not elevated, Dr Melvyn Novas saw patient,, no desaturation walking April, 2011, plan to follow  . Chronic diastolic CHF (congestive heart failure)   . Ejection fraction     Past Surgical History  Procedure Laterality Date  . Tonsillectomy and adenoidectomy  1953  . Dilation and curettage of uterus  2004    x2  . Hernia repair  2003    Allergies: Tramadol; Amiodarone; Atorvastatin; Bystolic; Cholestyramine; Clonidine hydrochloride; Lansoprazole; Oxycodone-acetaminophen; Penicillins; Prevalite; Propoxyphene hcl; Propoxyphene n-acetaminophen; and Aspirin  Medications: Prior to Admission medications   Medication Sig Start Date End Date Taking? Authorizing Provider  allopurinol (ZYLOPRIM) 300 MG tablet Take 300 mg by mouth every morning.    Yes Historical Provider, MD  atenolol (TENORMIN) 100 MG tablet Take 100 mg by mouth every morning.    Yes Historical Provider, MD  diltiazem (CARDIZEM CD) 240 MG 24 hr capsule Take 1 capsule (240 mg total) by mouth daily. Patient taking differently: Take 240 mg by mouth at bedtime.  12/08/12  Yes Carlena Bjornstad, MD  HYDROcodone-acetaminophen (NORCO/VICODIN) 5-325 MG per tablet Take 1 tablet by mouth every 6 (six) hours as needed for moderate pain.   Yes Historical Provider, MD  ibuprofen (ADVIL,MOTRIN) 200 MG tablet Take 200 mg by mouth every 6 (six) hours as needed.  Yes Historical Provider, MD  lisinopril (PRINIVIL,ZESTRIL) 40 MG tablet Take 40 mg by mouth every morning.    Yes Historical Provider, MD  minoxidil (LONITEN) 2.5 MG tablet Take 1 tablet by mouth every morning.  11/29/12  Yes Historical Provider, MD  omeprazole (PRILOSEC) 20 MG capsule  Take 20 mg by mouth every morning.    Yes Historical Provider, MD  traZODone (DESYREL) 50 MG tablet Take 1 tablet by mouth every evening. 12/11/12  Yes Historical Provider, MD  docusate sodium (COLACE) 100 MG capsule Take 100 mg by mouth daily as needed for mild constipation.    Historical Provider, MD  furosemide (LASIX) 80 MG tablet Take 40 mg by mouth every morning.  02/28/15   Historical Provider, MD  latanoprost (XALATAN) 0.005 % ophthalmic solution Place 1 drop into both eyes at bedtime.    Historical Provider, MD  levothyroxine (SYNTHROID, LEVOTHROID) 150 MCG tablet Take 150 mcg by mouth every morning.     Historical Provider, MD  LORazepam (ATIVAN) 0.5 MG tablet Take 0.5 mg by mouth at bedtime.    Historical Provider, MD  warfarin (COUMADIN) 5 MG tablet Take 5 mg by mouth at bedtime.     Historical Provider, MD     Family History  Problem Relation Age of Onset  . Breast cancer Maternal Aunt   . Lung cancer Mother     heavy smoker  . Heart disease Father   . Heart disease Maternal Grandfather     Social History   Social History  . Marital Status: Married    Spouse Name: N/A  . Number of Children: N/A  . Years of Education: N/A   Social History Main Topics  . Smoking status: Former Smoker -- 1.00 packs/day for 25 years    Quit date: 07/09/1990  . Smokeless tobacco: None  . Alcohol Use: No  . Drug Use: No  . Sexual Activity: Not Asked   Other Topics Concern  . None   Social History Narrative    Review of Systems: A 12 point ROS discussed and pertinent positives are indicated in the HPI above.  All other systems are negative.  Review of Systems  Constitutional: Positive for activity change, fatigue and unexpected weight change.  Respiratory: Negative for cough and shortness of breath.   Gastrointestinal: Negative for abdominal pain.  Musculoskeletal: Positive for back pain.  Neurological: Positive for weakness.  Psychiatric/Behavioral: Negative for behavioral  problems and confusion.    Vital Signs: BP 178/111 mmHg  Pulse 80  Temp(Src) 98.4 F (36.9 C) (Oral)  Resp 16  SpO2 98%  Physical Exam  Constitutional: She is oriented to person, place, and time.  Cardiovascular:  No murmur heard. Irregular rate/rhythm  Pulmonary/Chest: Effort normal and breath sounds normal. She has no wheezes.  Abdominal: Soft. Bowel sounds are normal. There is no tenderness.  Musculoskeletal: Normal range of motion.  Neurological: She is alert and oriented to person, place, and time.  Skin: Skin is warm and dry.  Psychiatric: She has a normal mood and affect. Her behavior is normal. Judgment and thought content normal.  Nursing note and vitals reviewed.   Mallampati Score:  MD Evaluation Airway: WNL Heart: WNL Abdomen: WNL Chest/ Lungs: WNL ASA  Classification: 3 Mallampati/Airway Score: One  Imaging: Nm Pet Image Initial (pi) Skull Base To Thigh  03/08/2015   CLINICAL DATA:  Initial treatment strategy for lung mass.  EXAM: NUCLEAR MEDICINE PET SKULL BASE TO THIGH  TECHNIQUE: 9.3 mCi F-18 FDG was injected  intravenously. Full-ring PET imaging was performed from the skull base to thigh after the radiotracer. CT data was obtained and used for attenuation correction and anatomic localization.  FASTING BLOOD GLUCOSE:  Value: 121 mg/dl  COMPARISON:  None available  FINDINGS: NECK  No hypermetabolic lymph nodes in the neck.  CHEST  Hypermetabolic mass in the superior segment of the RIGHT lower lobe measures 6.1 by 4.2 cm with intense metabolic activity (SUV max equal 34).  Cluster of hypermetabolic RIGHT paratracheal lymph nodes. For example 17 mm short axis lymph node on image 53 with SUV max 25.6.  Hypermetabolic RIGHT hilar lymph nodes which are difficult to define on this noncontrast CT. There is a hypermetabolic lymph node along the esophagus inferior to the carina (image 62 of fused data set.)  ABDOMEN/PELVIS  No abnormal hypermetabolic activity within the  liver, pancreas, adrenal glands, or spleen. No hypermetabolic lymph nodes in the abdomen or pelvis.  SKELETON  Single hypermetabolic focus associated with the anterior medial RIGHT sixth rib on image 81 of the fused data set. There is no corresponding CT abnormality.  IMPRESSION: 1. 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 2. Hypermetabolic mediastinal nodal metastasis to the RIGHT paratracheal nodal station as well a paraesophageal nodal metastasis inferior to carina. 3. Metastatic adenopathy to the RIGHT hilum. 4. Single focus of metabolic activity associated with the anterior RIGHT sixth rib without CT abnormality. Recommend correlation with trauma and if no such history, consider a solitary metastatic lesion.   Electronically Signed   By: Suzy Bouchard M.D.   On: 03/08/2015 15:16    Labs:  CBC:  Recent Labs  03/09/15 0700  WBC 7.9  HGB 14.1  HCT 40.6  PLT 229    COAGS:  Recent Labs  03/09/15 0700  INR 1.09  APTT 30    BMP:  Recent Labs  03/09/15 0700  NA 132*  K 4.4  CL 99*  CO2 24  GLUCOSE 129*  BUN 33*  CALCIUM 9.6  CREATININE 1.50*  GFRNONAA 34*  GFRAA 39*    LIVER FUNCTION TESTS: No results for input(s): BILITOT, AST, ALT, ALKPHOS, PROT, ALBUMIN in the last 8760 hours.  TUMOR MARKERS: No results for input(s): AFPTM, CEA, CA199, CHROMGRNA in the last 8760 hours.  Assessment and Plan:  Rt lung mass + LAN +PET Scheduled for Rt lung mass bx Risks and Benefits discussed with the patient including, but not limited to bleeding, hemoptysis, respiratory failure requiring intubation, infection, pneumothorax requiring chest tube placement, stroke from air embolism or even death. All of the patient's questions were answered, patient is agreeable to proceed. Consent signed and in chart.   Thank you for this interesting consult.  I greatly enjoyed meeting NAKEA GOUGER and look forward to participating in their  care.  A copy of this report was sent to the requesting provider on this date.  Signed: Treyshaun Keatts A 03/09/2015, 8:06 AM   I spent a total of  30 Minutes   in face to face in clinical consultation, greater than 50% of which was counseling/coordinating care for Rt lung mass bx

## 2015-03-15 ENCOUNTER — Other Ambulatory Visit: Payer: Self-pay | Admitting: Internal Medicine

## 2015-03-15 DIAGNOSIS — R918 Other nonspecific abnormal finding of lung field: Secondary | ICD-10-CM

## 2015-03-15 NOTE — Progress Notes (Signed)
Quick Note:  Referral made ______

## 2015-03-16 ENCOUNTER — Encounter: Payer: Self-pay | Admitting: *Deleted

## 2015-03-16 ENCOUNTER — Telehealth: Payer: Self-pay | Admitting: *Deleted

## 2015-03-16 ENCOUNTER — Other Ambulatory Visit: Payer: Medicare Other

## 2015-03-16 DIAGNOSIS — R918 Other nonspecific abnormal finding of lung field: Secondary | ICD-10-CM

## 2015-03-16 NOTE — Telephone Encounter (Signed)
Oncology Nurse Navigator Documentation  Oncology Nurse Navigator Flowsheets 03/16/2015  Referral date to RadOnc/MedOnc 03/15/2015  Navigator Encounter Type Introductory phone call/I received a referral on Kristy [name] yesterday.  I called and scheduled her to be seen next week. 03/24/15 arrive at 3:00.  She verbalized understanding of appt time and place.   Treatment Phase Abnormal Scans  Interventions Coordination of Care  Coordination of Care MD Appointments  Time Spent with Patient 30

## 2015-03-21 ENCOUNTER — Encounter: Payer: Self-pay | Admitting: Radiation Oncology

## 2015-03-21 ENCOUNTER — Encounter: Payer: Self-pay | Admitting: *Deleted

## 2015-03-21 ENCOUNTER — Encounter (HOSPITAL_COMMUNITY): Payer: Self-pay

## 2015-03-21 ENCOUNTER — Encounter (HOSPITAL_COMMUNITY): Payer: Self-pay | Admitting: *Deleted

## 2015-03-21 DIAGNOSIS — R918 Other nonspecific abnormal finding of lung field: Secondary | ICD-10-CM

## 2015-03-21 NOTE — Progress Notes (Signed)
Oncology Nurse Navigator Documentation  Oncology Nurse Navigator Flowsheets 03/21/2015  Navigator Encounter Type Other/Recieved order from Dr. Tammi Klippel.  He would like patient to have MRI Brain before lung clinic on 03/24/15.  I ordered and will call to schedule.  Called [place] to schedule MRI Brain.  I will have to call back in am.    Treatment Phase Abnormal Scans  Interventions Coordination of Care  Coordination of Care -  Time Spent with Patient 15

## 2015-03-23 ENCOUNTER — Encounter: Payer: Self-pay | Admitting: *Deleted

## 2015-03-23 ENCOUNTER — Telehealth: Payer: Self-pay | Admitting: *Deleted

## 2015-03-23 NOTE — Progress Notes (Signed)
Oncology Nurse Navigator Documentation  Oncology Nurse Navigator Flowsheets 03/23/2015  Navigator Encounter Type Other/I called radiology and updated them on creatine being elevated and what to do for patient. I was told they have protocols to go by for this.  I called central scheduling to schedule.  Appt is 03/28/15 arrive at 4:45 at [facility]  I will check with pre-cert team about prior auth.    Treatment Phase Abnormal Scans  Interventions Coordination of Care  Coordination of Care -  Time Spent with Patient 15

## 2015-03-23 NOTE — Telephone Encounter (Signed)
Called and left a message w/ a friendly reminder about pt's clinic appt tomorrow 9/15.  Left directions, instructions and contact name and number.

## 2015-03-23 NOTE — Progress Notes (Signed)
I called and left a vm message for Kristy [name] in [place] to call me about pre-cert for MRI

## 2015-03-24 ENCOUNTER — Encounter: Payer: Self-pay | Admitting: Internal Medicine

## 2015-03-24 ENCOUNTER — Encounter: Payer: Self-pay | Admitting: *Deleted

## 2015-03-24 ENCOUNTER — Ambulatory Visit: Payer: Medicare Other | Attending: Internal Medicine | Admitting: Physical Therapy

## 2015-03-24 ENCOUNTER — Ambulatory Visit (HOSPITAL_BASED_OUTPATIENT_CLINIC_OR_DEPARTMENT_OTHER): Payer: Medicare Other | Admitting: Internal Medicine

## 2015-03-24 ENCOUNTER — Other Ambulatory Visit (HOSPITAL_BASED_OUTPATIENT_CLINIC_OR_DEPARTMENT_OTHER): Payer: Medicare Other

## 2015-03-24 ENCOUNTER — Ambulatory Visit
Admission: RE | Admit: 2015-03-24 | Discharge: 2015-03-24 | Disposition: A | Discharge status: Home / Self Care | Payer: Medicare Other | Source: Ambulatory Visit | Attending: Radiation Oncology | Admitting: Radiation Oncology

## 2015-03-24 VITALS — BP 128/70 | HR 80 | Temp 99.1°F | Resp 18 | Ht 66.0 in | Wt 190.0 lb

## 2015-03-24 DIAGNOSIS — Z87891 Personal history of nicotine dependence: Secondary | ICD-10-CM

## 2015-03-24 DIAGNOSIS — C3431 Malignant neoplasm of lower lobe, right bronchus or lung: Secondary | ICD-10-CM

## 2015-03-24 DIAGNOSIS — R269 Unspecified abnormalities of gait and mobility: Secondary | ICD-10-CM

## 2015-03-24 DIAGNOSIS — Z9181 History of falling: Secondary | ICD-10-CM | POA: Diagnosis present

## 2015-03-24 DIAGNOSIS — R2689 Other abnormalities of gait and mobility: Secondary | ICD-10-CM

## 2015-03-24 DIAGNOSIS — R918 Other nonspecific abnormal finding of lung field: Secondary | ICD-10-CM

## 2015-03-24 DIAGNOSIS — R293 Abnormal posture: Secondary | ICD-10-CM | POA: Diagnosis present

## 2015-03-24 DIAGNOSIS — R5381 Other malaise: Secondary | ICD-10-CM

## 2015-03-24 LAB — COMPREHENSIVE METABOLIC PANEL (CC13)
ALT: 11 U/L (ref 0–55)
AST: 19 U/L (ref 5–34)
Albumin: 3.1 g/dL — ABNORMAL LOW (ref 3.5–5.0)
Alkaline Phosphatase: 120 U/L (ref 40–150)
Anion Gap: 7 mEq/L (ref 3–11)
BUN: 31.2 mg/dL — AB (ref 7.0–26.0)
CHLORIDE: 102 meq/L (ref 98–109)
CO2: 29 mEq/L (ref 22–29)
Calcium: 9.8 mg/dL (ref 8.4–10.4)
Creatinine: 2.1 mg/dL — ABNORMAL HIGH (ref 0.6–1.1)
EGFR: 24 mL/min/{1.73_m2} — ABNORMAL LOW (ref >90)
GLUCOSE: 103 mg/dL (ref 70–140)
POTASSIUM: 4.6 meq/L (ref 3.5–5.1)
SODIUM: 138 meq/L (ref 136–145)
Total Bilirubin: 1.59 mg/dL — ABNORMAL HIGH (ref 0.20–1.20)
Total Protein: 6.5 g/dL (ref 6.4–8.3)

## 2015-03-24 LAB — CBC WITH DIFFERENTIAL/PLATELET
BASO%: 0.5 % (ref 0.0–2.0)
BASOS ABS: 0 10*3/uL (ref 0.0–0.1)
EOS%: 1.8 % (ref 0.0–7.0)
Eosinophils Absolute: 0.2 10*3/uL (ref 0.0–0.5)
HCT: 40.3 % (ref 34.8–46.6)
HEMOGLOBIN: 13.7 g/dL (ref 11.6–15.9)
LYMPH%: 4.1 % — ABNORMAL LOW (ref 14.0–49.7)
MCH: 30.2 pg (ref 25.1–34.0)
MCHC: 34 g/dL (ref 31.5–36.0)
MCV: 88.8 fL (ref 79.5–101.0)
MONO#: 0.7 10*3/uL (ref 0.1–0.9)
MONO%: 8.2 % (ref 0.0–14.0)
NEUT#: 7.2 10*3/uL — ABNORMAL HIGH (ref 1.5–6.5)
NEUT%: 85.4 % — AB (ref 38.4–76.8)
Platelets: 242 10*3/uL (ref 145–400)
RBC: 4.54 10*6/uL (ref 3.70–5.45)
RDW: 15.8 % — AB (ref 11.2–14.5)
WBC: 8.4 10*3/uL (ref 3.9–10.3)
lymph#: 0.3 10*3/uL — ABNORMAL LOW (ref 0.9–3.3)

## 2015-03-24 MED ORDER — PROCHLORPERAZINE MALEATE 10 MG PO TABS: 10.0000 mg | ORAL_TABLET | Freq: Four times a day (QID) | ORAL | Status: AC | PRN

## 2015-03-24 MED ORDER — TRAMADOL HCL 50 MG PO TABS: 50.0000 mg | ORAL_TABLET | Freq: Four times a day (QID) | ORAL | Status: DC | PRN

## 2015-03-24 NOTE — Therapy (Signed)
[place], [place], [address] Phone: [phone]   Fax:  [phone]  Physical Therapy Evaluation  Patient Details  Name: Kristy [name] MRN: [id number] Date of Birth: [birth year] Referring Provider:  Curt Bears, MD  Encounter Date: 03/24/2015      PT End of Session - 03/24/15 1723    Visit Number 1   Number of Visits 1   PT Start Time 1645   PT Stop Time 1707   PT Time Calculation (min) 22 min   Activity Tolerance Patient tolerated treatment well   Behavior During Therapy [facility] for tasks assessed/performed      Past Medical History  Diagnosis Date  . Atrial fibrillation 09/29/2008    Paroxysmal, amiodarone therapy with cardioversion to sinus rhythm in the past. / cardioversion on higher dose amiodarone successful October, 2010 / amiodarone stopped or pulmonary illness November, 2010, patient improved, amiodarone not restarted / regular rhythm January, 2011 / returned atrial fibrillation by March, 2011... plan rate control  . RENAL INSUFFICIENCY 09/29/2008    Creatinine 1.9  . VENTRICULAR HYPERTROPHY, LEFT 09/29/2008  . PULMONARY HYPERTENSION 09/29/2008  . ALLERGIC RHINITIS 09/29/2008  . HYPERCHOLESTEROLEMIA 09/29/2008  . ANXIETY DEPRESSION 09/29/2008  . OBESITY 09/29/2008  . GERD 09/29/2008  . HYPERTENSION 09/29/2008    No echo data as of April, 2011 ( normal LV function by history)  . PEPTIC ULCER DISEASE 09/29/2008  . THYROTOXICOSIS 09/29/2008  . CHEST DISCOMFORT 05/30/2009    In October,2010  ,??GI??  . CAD 10/20/2008    Catheterization [facility] 2001, 70% circumflex / nuclear April, 2010 no ischemia, not gated with atrial fib  . Cough 04/12/2009    Her lisinopril held October, 2010, cough resolved  . Warfarin anticoagulation   . Peptic ulcer disease   . Fluid overload     Improvement with Zaroxolyn July, 2011  . Shortness of breath     Amiodarone will not be restarted  /     Pulmonary illness  November, 2010, abnormal CT, sedimentation rate not elevated, Dr Melvyn Novas saw patient,, no desaturation walking April, 2011, plan to follow  . Chronic diastolic CHF (congestive heart failure)   . Ejection fraction     Past Surgical History  Procedure Laterality Date  . Tonsillectomy and adenoidectomy  1953  . Dilation and curettage of uterus  2004    x2  . Hernia repair  2003    There were no vitals filed for this visit.  Visit Diagnosis:  Unstable balance - Plan: PT plan of care cert/re-cert  Gait abnormality - Plan: PT plan of care cert/re-cert  Physical deconditioning - Plan: PT plan of care cert/re-cert  Posture abnormality - Plan: PT plan of care cert/re-cert  Personal history of fall - Plan: PT plan of care cert/re-cert      Subjective Assessment - 03/24/15 1712    Subjective some low back pain; reports legs "just buckle" at times   Patient is accompained by: Family member  husband   Pertinent History Incidental finding of lung nodule when patient was being worked up for back pain and weight loss.  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  Ex-smoker who quit 1992 after 25 pack-years.  CHF, atrial fibrillation, pulmonary HTN, CAD, COPD.   Patient Stated Goals get information from clinic providers   Currently in Pain? 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 3   Has the patient had a decrease in activity level because of a fear of falling?  Yes   Is the patient reluctant to leave their home because of a fear of falling?  Yes   [place] Private residence   Living Arrangements Spouse/significant other   Type of [place] Two level  but can stay on one level   Additional Comments has to sleep in a recliner; can't breathe if supine, even with head elevated   Prior Function   Level of Independence Needs assistance with ADLs;Needs assistance with homemaking   Leisure no current exercise   Cognition   Overall Cognitive Status Within Functional Limits for tasks assessed   Observation/Other Assessments   Observations pleasant, heavyset woman sitting in a wheelchair; slouches much of the time   Functional Tests   Functional tests Sit to Stand   Sit to Stand   Comments uses hands to push up to standing   Posture/Postural Control   Posture/Postural Control Postural limitations   Postural Limitations Forward head;Rounded Shoulders;Flexed trunk   Posture Comments unable to stand stright; unable to stand without support   ROM / Strength   AROM / PROM / Strength --  not tested today   Ambulation/Gait   Ambulation/Gait Yes   Ambulation/Gait Assistance Not tested (comment)   Assistive device Straight cane;Other (Comment)  is getting a rollator walker soon   Gait Comments Patient unable to walk during evaluation; has a hurrycane, but was unable to demonstrate today due to weakness in legs.  Pt. reports her legs get weak and won't support her intermittently.  She describes that they begin to "feel funny" and then will buckle and not hold her.   Balance   Balance Assessed Yes   Static Standing Balance   Static Standing - Comment/# of Minutes unable to stand with using UE support; slouches forward                           PT Education - 03/24/15 1722    Education provided  Yes   Education Details how to adjust the walker she will get to the proper height; energy conservation, staying active, good posture, breathing, and PT info (including the home health option at her home)   Person(s) Educated Patient;Spouse   Methods Explanation;Handout   Comprehension Verbalized understanding               Lung Clinic Goals - 03/24/15 1729    Patient will be able to verbalize understanding of the benefit of exercise to decrease fatigue.   Status Achieved   Patient will be able to verbalize the importance of posture.   Status Achieved   Patient will be able to demonstrate diaphragmatic breathing for improved lung function.  Status Achieved   Patient will be able to verbalize understanding of the role of physical therapy to prevent functional decline and who to contact if physical therapy is needed.   Status Achieved             Plan - 04-02-15 1725    Clinical Impression Statement This is a very pleasant woman just diagnosed with stage IV lung cancer who has impaired balance and gait with a h/o falls.   Pt will benefit from skilled therapeutic intervention in order to improve on the following deficits Decreased balance;Difficulty walking   Rehab Potential Fair   PT Frequency One time visit   PT Treatment/Interventions Patient/family education   PT Home Exercise Plan stay active; could try chair exercises, and there may be a televised chair exercise class she could use (walking is unsafe currently due to legs giving way at times)   Recommended Other Services Patient could probably benefit from home health PT if available in [place], [place], to help with balance and gait.   Consulted and Agree with Plan of Care Patient;Family member/caregiver          G-Codes - 04-02-15 1729    Functional Assessment Tool Used clinical judgement   Functional Limitation Mobility: Walking and moving around   Mobility: Walking and Moving Around Current Status [phone]) At  least 60 percent but less than 80 percent impaired, limited or restricted   Mobility: Walking and Moving Around Goal Status [phone]) At least 60 percent but less than 80 percent impaired, limited or restricted   Mobility: Walking and Moving Around Discharge Status [phone]) At least 60 percent but less than 80 percent impaired, limited or restricted       Problem List Patient Active Problem List   Diagnosis Date Noted  . 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 Weakness of both legs 03/01/2015  . 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 Ejection fraction   . Chronic diastolic CHF (congestive heart failure)   . Warfarin anticoagulation   . Fluid overload   . Shortness of breath   . CHEST DISCOMFORT 05/30/2009  . Cough 04/12/2009  . CAD 10/20/2008  . OBESITY 09/29/2008  . GERD 09/29/2008  . HYPERTENSION 09/29/2008  . Atrial fibrillation 09/29/2008  . RENAL INSUFFICIENCY 09/29/2008  . VENTRICULAR HYPERTROPHY, LEFT 09/29/2008  . PULMONARY HYPERTENSION 09/29/2008  . HYPERCHOLESTEROLEMIA 09/29/2008  . ANXIETY DEPRESSION 09/29/2008  . OBESITY 09/29/2008  . GERD 09/29/2008    SALIS[place]Y,DONNA 2015/04/02, 5:34 PM  [place], [place], [address] Phone: [phone]   Fax:  [place], PT 04-02-2015 5:34 PM

## 2015-03-24 NOTE — Progress Notes (Signed)
Radiation Oncology         [phone] ________________________________  Multidisciplinary Thoracic Oncology Clinic [facility]) Initial Outpatient Consultation  Name: REYANNA BALEY MRN: [id number]  Date: 03/24/2015  DOB: 12-03-1945  DX:IPJAS,NKNLZJ E, MD  Tanda Rockers, MD   REFERRING PHYSICIAN: Tanda Rockers, MD  DIAGNOSIS: There were no encounter diagnoses. 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  HISTORY OF PRESENT ILLNESS:Josanna P Steadman is a 69 y.o. female who was referred to Dr. Melvyn Novas from Dr. Kipp Brood in [place], [place] for evaluation of a lung mass. A PET CT was performed on 03/08/2015. The patient was found to have a 6.7HA hypermetabolic mass of the right lower lung with a maximum SUV of 34. She was also found to have a cluster of right paratracheal lymph nodes with a maximum SUV of 25.6. They were also hypermetabolic right hilar lymph nodes. A CT guided biospy preformed on 03/09/2015 revealed squamous cell carcinoma. Genomic testing was performed on the biospy specimen. The tumor was positive for PD-L1. The patient has been referred to the multidisciplinary thoracic oncology clinic and conference for evaluation. We reviewed the radiographic studies as well as the pathology slides as a group in conference and are seeing the patient in the clinic today. Her ambulatory status for today's appointment was wheelchair bound. The patient projected a healthy mental status and was accompanied by her husband for today's radiation oncology visit. The patient and her husband live in [place], [place] (near [place] 2 hour commute one day), but her son lives in [place], [place]. For this concerning reason, it is understandable if the patient seeks a location that is closer to where they live. However, they expressed transportation to and from [place], [place] was not an issue, as they are both retired.  PREVIOUS  RADIATION THERAPY: No  PAST MEDICAL HISTORY:  has a past medical history of Atrial fibrillation (09/29/2008); RENAL INSUFFICIENCY (09/29/2008); VENTRICULAR HYPERTROPHY, LEFT (09/29/2008); PULMONARY HYPERTENSION (09/29/2008); ALLERGIC RHINITIS (09/29/2008); HYPERCHOLESTEROLEMIA (09/29/2008); ANXIETY DEPRESSION (09/29/2008); OBESITY (09/29/2008); GERD (09/29/2008); HYPERTENSION (09/29/2008); PEPTIC ULCER DISEASE (09/29/2008); THYROTOXICOSIS (09/29/2008); CHEST DISCOMFORT (05/30/2009); CAD (10/20/2008); Cough (04/12/2009); Warfarin anticoagulation; Peptic ulcer disease; Fluid overload; Shortness of breath; Chronic diastolic CHF (congestive heart failure); and Ejection fraction.    PAST SURGICAL HISTORY: Past Surgical History  Procedure Laterality Date  . Tonsillectomy and adenoidectomy  1953  . Dilation and curettage of uterus  2004    x2  . Hernia repair  2003    FAMILY HISTORY: family history includes Breast cancer in her maternal aunt; Heart disease in her father and maternal grandfather; Lung cancer in her mother.  SOCIAL HISTORY:  reports that she quit smoking about 24 years ago. She does not have any smokeless tobacco history on file. She reports that she does not drink alcohol or use illicit drugs.  ALLERGIES: Tramadol; Amiodarone; Atorvastatin; Bystolic; Cholestyramine; Clonidine hydrochloride; Lansoprazole; Oxycodone-acetaminophen; Penicillins; Prevalite; Propoxyphene hcl; Propoxyphene n-acetaminophen; and Aspirin  MEDICATIONS:  Current Outpatient Prescriptions  Medication Sig Dispense Refill  . allopurinol (ZYLOPRIM) 300 MG tablet Take 300 mg by mouth every morning.     Marland Kitchen atenolol (TENORMIN) 100 MG tablet Take 100 mg by mouth every morning.     . diltiazem (CARDIZEM CD) 240 MG 24 hr capsule Take 1 capsule (240 mg total) by mouth daily. (Patient taking differently: Take 240 mg by mouth at bedtime. ) 90 capsule 3  . docusate sodium (COLACE) 100  MG capsule Take 100 mg by mouth daily as needed for mild  constipation.    . furosemide (LASIX) 80 MG tablet Take 40 mg by mouth every morning.   0  . HYDROcodone-acetaminophen (NORCO/VICODIN) 5-325 MG per tablet Take 1 tablet by mouth every 6 (six) hours as needed for moderate pain.    Marland Kitchen ibuprofen (ADVIL,MOTRIN) 200 MG tablet Take 200 mg by mouth every 6 (six) hours as needed.    . latanoprost (XALATAN) 0.005 % ophthalmic solution Place 1 drop into both eyes at bedtime.    Marland Kitchen levothyroxine (SYNTHROID, LEVOTHROID) 150 MCG tablet Take 150 mcg by mouth every morning.     Marland Kitchen lisinopril (PRINIVIL,ZESTRIL) 40 MG tablet Take 40 mg by mouth every morning.     Marland Kitchen LORazepam (ATIVAN) 0.5 MG tablet Take 0.5 mg by mouth at bedtime.    . minoxidil (LONITEN) 2.5 MG tablet Take 1 tablet by mouth every morning.     Marland Kitchen omeprazole (PRILOSEC) 20 MG capsule Take 20 mg by mouth every morning.     . traZODone (DESYREL) 50 MG tablet Take 1 tablet by mouth every evening.    . warfarin (COUMADIN) 5 MG tablet Take 5 mg by mouth at bedtime.      No current facility-administered medications for this encounter.    REVIEW OF SYSTEMS:  A 15 point review of systems is documented in the electronic medical record. This was obtained by the nursing staff. However, I reviewed this with the patient to discuss relevant findings and make appropriate changes.  Pertinent items are noted in HPI.   PHYSICAL EXAM:  vitals were not taken for this visit.  There were no vitals taken for this visit.  General Appearance:    Alert, cooperative, no distress, appears stated age  Head:    Normocephalic, without obvious abnormality, atraumatic  Eyes:    PERRL, conjunctiva/corneas clear, EOM's intact, fundi    benign, both eyes  Ears:    Normal TM's and external ear canals, both ears  Nose:   Nares normal, septum midline, mucosa normal, no drainage    or sinus tenderness  Throat:   Lips, mucosa, and tongue normal; teeth and gums normal  Neck:   Supple, symmetrical, trachea midline, no adenopathy;     thyroid:  no enlargement/tenderness/nodules; no carotid   bruit or JVD  Back:     Symmetric, no curvature, ROM normal, no CVA tenderness  Lungs:     Clear to auscultation bilaterally, respirations unlabored  Chest Wall:    No tenderness or deformity   Heart:    Regular rate and rhythm, S1 and S2 normal, no murmur, rub   or gallop  Breast Exam:    No tenderness, masses, or nipple abnormality  Abdomen:     Soft, non-tender, bowel sounds active all four quadrants,    no masses, no organomegaly  Genitalia:    Normal female without lesion, discharge or tenderness  Rectal:    Normal tone, normal prostate, no masses or tenderness;   guaiac negative stool  Extremities:   Extremities normal, atraumatic, no cyanosis or edema  Pulses:   2+ and symmetric all extremities  Skin:   Skin color, texture, turgor normal, no rashes or lesions  Lymph nodes:   Cervical, supraclavicular, and axillary nodes normal  Neurologic:   CNII-XII intact, normal strength, sensation and reflexes    throughout    KPS = 80  100 - Normal; no complaints; no evidence of disease. 90   -  Able to carry on normal activity; minor signs or symptoms of disease. 80   - Normal activity with effort; some signs or symptoms of disease. 73   - Cares for self; unable to carry on normal activity or to do active work. 60   - Requires occasional assistance, but is able to care for most of his personal needs. 50   - Requires considerable assistance and frequent medical care. 60   - Disabled; requires special care and assistance. 63   - Severely disabled; hospital admission is indicated although death not imminent. 31   - Very sick; hospital admission necessary; active supportive treatment necessary. 10   - Moribund; fatal processes progressing rapidly. 0     - Dead  Karnofsky DA, Abelmann [place], Craver LS and Burchenal [facility] [phone]) The use of the nitrogen mustards in the palliative treatment of carcinoma: with particular reference to  bronchogenic carcinoma Cancer 1 634-56  LABORATORY DATA:  Lab Results  Component Value Date   WBC 7.9 03/09/2015   HGB 14.1 03/09/2015   HCT 40.6 03/09/2015   MCV 86.8 03/09/2015   PLT 229 03/09/2015   Lab Results  Component Value Date   NA 132* 03/09/2015   K 4.4 03/09/2015   CL 99* 03/09/2015   CO2 24 03/09/2015   No results found for: ALT, AST, GGT, ALKPHOS, BILITOT  PULMONARY FUNCTION TEST:  N/A   RADIOGRAPHY: Dg Chest 1 View  03/09/2015   CLINICAL DATA:  69 year old female status post right lung biopsy. Initial encounter.  EXAM: CHEST  1 VIEW  COMPARISON:  CT-guided biopsy images 0852 hr today, and earlier  FINDINGS: Portable AP upright view at 1058 hrs. No pneumothorax. Stable round right lower lung mass. Stable lung volumes. Stable cardiac size and mediastinal contours. Chronic increased interstitial markings.  IMPRESSION: No pneumothorax or acute findings status post CT-guided right lung mass biopsy.   Electronically Signed   By: Genevie Ann M.D.   On: 03/09/2015 11:08   Nm Pet Image Initial (pi) Skull Base To Thigh  03/08/2015   CLINICAL DATA:  Initial treatment strategy for lung mass.  EXAM: NUCLEAR MEDICINE PET SKULL BASE TO THIGH  TECHNIQUE: 9.3 mCi F-18 FDG was injected intravenously. Full-ring PET imaging was performed from the skull base to thigh after the radiotracer. CT data was obtained and used for attenuation correction and anatomic localization.  FASTING BLOOD GLUCOSE:  Value: 121 mg/dl  COMPARISON:  None available  FINDINGS: NECK  No hypermetabolic lymph nodes in the neck.  CHEST  Hypermetabolic mass in the superior segment of the RIGHT lower lobe measures 6.1 by 4.2 cm with intense metabolic activity (SUV max equal 34).  Cluster of hypermetabolic RIGHT paratracheal lymph nodes. For example 17 mm short axis lymph node on image 53 with SUV max 25.6.  Hypermetabolic RIGHT hilar lymph nodes which are difficult to define on this noncontrast CT. There is a hypermetabolic lymph  node along the esophagus inferior to the carina (image 62 of fused data set.)  ABDOMEN/PELVIS  No abnormal hypermetabolic activity within the liver, pancreas, adrenal glands, or spleen. No hypermetabolic lymph nodes in the abdomen or pelvis.  SKELETON  Single hypermetabolic focus associated with the anterior medial RIGHT sixth rib on image 81 of the fused data set. There is no corresponding CT abnormality.  IMPRESSION: 1. 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 2. Hypermetabolic mediastinal nodal metastasis to the RIGHT paratracheal nodal station as well a paraesophageal nodal metastasis inferior  to carina. 3. Metastatic adenopathy to the RIGHT hilum. 4. Single focus of metabolic activity associated with the anterior RIGHT sixth rib without CT abnormality. Recommend correlation with trauma and if no such history, consider a solitary metastatic lesion.   Electronically Signed   By: Suzy Bouchard M.D.   On: 03/08/2015 15:16   Ct Biopsy  03/09/2015   CLINICAL DATA:  6 cm mass of the right lower lobe with metastatic lymphadenopathy by PET scan. The patient presents for percutaneous biopsy of the lung mass.  EXAM: CT GUIDED CORE BIOPSY OF RIGHT LUNG MASS  ANESTHESIA/SEDATION: 1.0  Mg IV Versed; 25 mcg IV Fentanyl  Total Moderate Sedation Time: 13 minutes.  MEDICATIONS: 10 mg IV hydralazine  PROCEDURE: The procedure risks, benefits, and alternatives were explained to the patient. Questions regarding the procedure were encouraged and answered. The patient understands and consents to the procedure. A time-out was performed prior to the procedure.  CT was performed in a prone position through the lower chest. The right posterior chest wall was prepped with Betadine in a sterile fashion, and a sterile drape was applied covering the operative field. A sterile gown and sterile gloves were used for the procedure. Local anesthesia was provided with 1% Lidocaine.  Under CT  guidance, a 17 gauge trocar needle was advanced from a posterior approach into the right lower lung. After confirming needle tip position, coaxial 18 gauge core biopsy samples were obtained. A total of 3 samples were obtained and submitted in formalin. The outer needle was removed and postprocedural CT performed.  COMPLICATIONS: Tiny posterior pneumothorax.  SIR level A: No therapy, no consequence.  FINDINGS: The 6 cm posterior right lower lobe mass was localized. Fragmented solid tissue was obtained with core biopsy. Postprocedure CT shows a small amount of air in the posterior pleural space adjacent to the mass. This was asymptomatic. Followup chest x-ray revealed no pneumothorax during recovery.  IMPRESSION: CT-guided core biopsy of 6 cm right lower lobe lung mass. The procedure was complicated by a tiny posterior pneumothorax which was asymptomatic. No pneumothorax with seen on followup chest x-ray 2 hours after the procedure.   Electronically Signed   By: Aletta Edouard M.D.   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 She may benefit from concurrent chemotherapy and radiation. The patient understands the differences, purposes, and benefits of both chemotherapy and radiation therapy treatments. The results of her most recent PET and CT scans were reviewed in detail and the patient and her husband acknowledged understanding what was reviewed. The patient is aware that she can access her future appointment dates and other medial records via [facility].  PLAN: Today, I talked to the patient and family about the findings and work-up thus far. We discussed the natural history of locally advanced non-small cell lung cancer and general treatment, highlighting the role of radiotherapy in the management. We discussed the avail able radiation techniques, and focused on the details of logistics and delivery.  We reviewed  the anticipated acute and late sequelae associated with radiation in this setting.  The patient was encouraged to ask questions that I answered to the best of my ability.  I filled out a patient counseling form during our discussion including treatment diagrams.  We retained a copy for our records. The patient would like to proceed with radiation and will be scheduled for CT  simulation.  The patient understands the benefits, risks, and purpose of radiation therapy were reviewed in detail, including statistics from reputable studies. The patient was educated on common symptoms to expect during radiation treatment and healthy methods of management to address these symptoms if they are to occur. The patient was educated on the traditional process she will experience during radiation therapy treatments, including the purpose and definition of a CT scan, simulation planning session, and the appropriate time frame to set aside daily for treatments. The patient is aware of her brain MRI to take place on Monday, 03/28/2015. She was sent home with additional educational resources that she can access via the Internet. She is aware of her CT scan and simulation planning session to take place as scheduled. She is aware of her follow-up appointment with radiation oncology to take place as scheduled and to notify Dr. Tammi Klippel, MD or Dr. Inda Merlin, MD if any symptoms of concern occur in regards to her treatments and recovery.  I spent 60 minutes minutes face to face with the patient and more than 50% of that time was spent in counseling and/or coordination of care.    This document serves as a record of services personally performed by Tyler Pita, MD. It was created on his behalf by Lenn Cal, a trained medical scribe. The creation of this record is based on the scribe's personal observations and the provider's statements to them. This document has been checked and approved by the attending  provider. ------------------------------------------------  [name] Tammi Klippel, M.D.

## 2015-03-24 NOTE — Progress Notes (Signed)
[facility] Clinical Social Work  Clinical Social Work met with patient/family and Futures trader at [facility] appointment to offer support and assess for psychosocial needs.  Medical oncologist reviewed patient's diagnosis and recommended treatment plan with patient/family.  Patient was accompanied by patient's spouse, [place].  They recently celebrated their 42th wedding anniversary.  The couple lives two hours away in [place], [place], their only son lives in [place].  Patient was agreeable to treatment plan, expressed she was "shocked" to learn about her cancer, but relies on her faith to get through this experience.  Her spouse did not verbalize any concerns at this time, but was tearful during visit.  CSW provided brief emotional support and discussed possible available resources.  Clinical Social Work briefly discussed Clinical Social Work role and [employer] support programs/services.  Clinical Social Work encouraged patient to call with any additional questions or concerns.   Polo Riley, MSW, LCSW, OSW-C Clinical Social Worker [facility] [phone]

## 2015-03-24 NOTE — Progress Notes (Signed)
Oncology Nurse Navigator Documentation  Oncology Nurse Navigator Flowsheets 03/24/2015  Navigator Encounter Type Clinic/MDC  Patient Visit Type Initial  Treatment Phase Abnormal Scans  Barriers/Navigation Needs Education;Financial  Education Newly Diagnosed Cancer Education  Interventions Education Method  Coordination of Care -  Education Method Written;Verbal  Time Spent with Patient 30         Thoracic Treatment Summary Name:Kristy [name] Date:03/24/2015 DOB:1945/11/29 Your Medical Team Medical Oncologist:Dr. Mohamed Radiation Oncologist:Dr. Tammi Klippel   Type and Stage of Lung Cancer Non-Small Cell Carcinoma: Squamous Cell   Clinical Stage:  squamous cell Cancer of lower lobe of right lung   Staging form: Lung, AJCC 7th Edition     Clinical stage from 03/24/2015: Stage IIIA (T2b, N2, M0) - Signed by Curt Bears, MD on 03/24/2015    Clinical stage is based on radiology exams.  Pathological stage will be determined after surgery.  Staging is based on the size of the tumor, involvement of lymph nodes or not, and whether or not the cancer center has spread. Recommendations Recommendations: Concurrent chemo radiation therapy  These recommendations are based on information available as of today's consult.  This is subject to change depending further testing or exams. Next Steps Next Step: Medical Oncology will set up follow up appointments  Radiation Oncology will set up follow up appointments 9/19 MRI Brain 9/19 SIM Barriers to Care What do you perceive as a potential barrier that may prevent you from receiving your treatment plan? Financial will see FA Education Information given and explained    Resources Given: [employer] booklet NSCLC Support/Resources Services at [employer].Radonna Ricker [phone]  Fall Risk Information     Questions Norton Blizzard, RN BSN Thoracic Oncology Nurse Navigator at [facility] is a nurse navigator  that is available to assist you through your cancer journey.  She can answer your questions and/or provide resources regarding your treatment plan, emotional support, or financial concerns.

## 2015-03-24 NOTE — Progress Notes (Signed)
[place] [phone]   [phone] Multidisciplinary thoracic oncology clinic  CONSULT NOTE  REFERRING PHYSICIAN: Dr. Christinia Gully  REASON FOR CONSULTATION:  69 years old white Kristy [name] diagnosed with lung cancer.  HPI Kristy Kristy [name] is a 69 y.o. Kristy [name] was past medical history significant for carotid artery disease, atrial fibrillation, renal insufficiency, dyslipidemia, congestive heart failure, anxiety/depression, hypertension, GERD as well as long history of smoking but quit 25 years ago. The patient lives in [place], [place] but she has a son who lives in [place]. She was treated for pneumonia in January 2016. She was seen by her primary care physician complaining of back pain as well as weight loss and she asked about repeat imaging studies for reevaluation of her previous pneumonia. Imaging studies at that time showed questionable right lower lobe lung mass with mediastinal lymphadenopathy. The patient was referred to Dr. Melvyn Novas for evaluation and a PET scan was performed on 03/08/2015 and it showed hypermetabolic mass in the superior segment of the right lower lobe measuring 6.1 x 4.2 cm with intense metabolic activity and SUV max of 34. There was clusters of hypermetabolic right paratracheal lymph nodes including 1.7 cm short axis lymph node with SUV max of 25.6. There was also hypermetabolic right hilar lymph node and hypermetabolic lymph node along the esophagus inferior to the carina. There was a single focus of metabolic activity associated with the anterior right sixth rib without CT abnormality questionable for trauma versus solitary metastatic lesion.  The patient underwent CT-guided core biopsy of the right lung mass by interventional radiology on 8/31 2016 and the final pathology (Accession: [id number]) showed the squamous cell carcinoma. The tumor is positive with p63, cytokeratin 5/6 and cytokeratin 903 and negative with CD 56,  chromogranin, synaptophysin and TTF-1 supporting the diagnosis. The tissue block was sent for PDL 1 testing and the result was positive with 50% score.  Dr. Melvyn Novas kindly referred the patient to the multidisciplinary thoracic oncology clinic today for evaluation and recommendation regarding treatment of her condition. When seen today the patient is not feeling well and complains of generalized weakness especially in the legs as well as insomnia, shortness of breath and cough productive of clear sputum. She lost few pounds recently. She also complains of constipation. She denied having any significant chest pain or hemoptysis. She has no nausea or vomiting. She denied having any headache or visual changes. Family history significant for mother with lung cancer at age 23. The patient is married and lives in [place], [place] but she has a son was in [place] and she would like all her care to be done in [place]. She was accompanied by her husband Mango. She used to work in a Nurse, mental health. She has a history of smoking 2 packs per day for around 25 years and quit 25 years ago. She has no history of alcohol or drug abuse.  HPI  Past Medical History  Diagnosis Date  . Atrial fibrillation 09/29/2008    Paroxysmal, amiodarone therapy with cardioversion to sinus rhythm in the past. / cardioversion on higher dose amiodarone successful October, 2010 / amiodarone stopped or pulmonary illness November, 2010, patient improved, amiodarone not restarted / regular rhythm January, 2011 / returned atrial fibrillation by March, 2011... plan rate control  . RENAL INSUFFICIENCY 09/29/2008    Creatinine 1.9  . VENTRICULAR HYPERTROPHY, LEFT 09/29/2008  . PULMONARY HYPERTENSION 09/29/2008  . ALLERGIC RHINITIS 09/29/2008  . HYPERCHOLESTEROLEMIA 09/29/2008  . ANXIETY  DEPRESSION 09/29/2008  . OBESITY 09/29/2008  . GERD 09/29/2008  . HYPERTENSION 09/29/2008    No echo data as of April, 2011 ( normal LV function by history)   . PEPTIC ULCER DISEASE 09/29/2008  . THYROTOXICOSIS 09/29/2008  . CHEST DISCOMFORT 05/30/2009    In October,2010  ,??GI??  . CAD 10/20/2008    Catheterization [facility] 2001, 70% circumflex / nuclear April, 2010 no ischemia, not gated with atrial fib  . Cough 04/12/2009    Her lisinopril held October, 2010, cough resolved  . Warfarin anticoagulation   . Peptic ulcer disease   . Fluid overload     Improvement with Zaroxolyn July, 2011  . Shortness of breath     Amiodarone will not be restarted  /     Pulmonary illness November, 2010, abnormal CT, sedimentation rate not elevated, Dr Melvyn Novas saw patient,, no desaturation walking April, 2011, plan to follow  . Chronic diastolic CHF (congestive heart failure)   . Ejection fraction     Past Surgical History  Procedure Laterality Date  . Tonsillectomy and adenoidectomy  1953  . Dilation and curettage of uterus  2004    x2  . Hernia repair  2003    Family History  Problem Relation Age of Onset  . Breast cancer Maternal Aunt   . Lung cancer Mother     heavy smoker  . Heart disease Father   . Heart disease Maternal Grandfather     Social History Social History  Substance Use Topics  . Smoking status: Former Smoker -- 1.00 packs/day for 25 years    Quit date: 07/09/1990  . Smokeless tobacco: None  . Alcohol Use: No    Allergies  Allergen Reactions  . Tramadol Other (See Comments)    Dizziness, and altered mental statas  . Amiodarone Other (See Comments)    Does not remember.   . Atorvastatin Hives and Itching  . Bystolic [Nebivolol Hcl]     "Drops blood pressure too low"  . Cholestyramine   . Clonidine Hydrochloride Other (See Comments)    Does not remember.   . Lansoprazole Other (See Comments)    "upset stomach"  . Oxycodone-Acetaminophen Other (See Comments)    "made me so nervous"  . Penicillins Hives and Itching  . Prevalite [Cholestyramine Light]   . Propoxyphene Hcl   . Propoxyphene N-Acetaminophen   . Aspirin  Palpitations    Current Outpatient Prescriptions  Medication Sig Dispense Refill  . allopurinol (ZYLOPRIM) 300 MG tablet Take 300 mg by mouth every morning.     Marland Kitchen atenolol (TENORMIN) 100 MG tablet Take 100 mg by mouth every morning.     . diltiazem (CARDIZEM CD) 240 MG 24 hr capsule Take 1 capsule (240 mg total) by mouth daily. (Patient taking differently: Take 240 mg by mouth at bedtime. ) 90 capsule 3  . docusate sodium (COLACE) 100 MG capsule Take 100 mg by mouth daily as needed for mild constipation.    . furosemide (LASIX) 80 MG tablet Take 40 mg by mouth every morning.   0  . ibuprofen (ADVIL,MOTRIN) 200 MG tablet Take 200 mg by mouth every 6 (six) hours as needed.    . latanoprost (XALATAN) 0.005 % ophthalmic solution Place 1 drop into both eyes at bedtime.    Marland Kitchen levothyroxine (SYNTHROID, LEVOTHROID) 150 MCG tablet Take 150 mcg by mouth every morning.     Marland Kitchen lisinopril (PRINIVIL,ZESTRIL) 40 MG tablet Take 40 mg by mouth every morning.     Marland Kitchen  LORazepam (ATIVAN) 0.5 MG tablet Take 0.5 mg by mouth at bedtime.    . minoxidil (LONITEN) 2.5 MG tablet Take 1 tablet by mouth every morning.     Marland Kitchen omeprazole (PRILOSEC) 20 MG capsule Take 20 mg by mouth every morning.     . traZODone (DESYREL) 50 MG tablet Take 1 tablet by mouth every evening.    . warfarin (COUMADIN) 5 MG tablet Take 5 mg by mouth at bedtime.     Marland Kitchen HYDROcodone-acetaminophen (NORCO/VICODIN) 5-325 MG per tablet Take 1 tablet by mouth every 6 (six) hours as needed for moderate pain.     No current facility-administered medications for this visit.    Review of Systems  Constitutional: positive for fatigue and weight loss Eyes: negative Ears, nose, mouth, throat, and face: negative Respiratory: positive for cough, dyspnea on exertion and sputum Cardiovascular: negative Gastrointestinal: negative Genitourinary:negative Integument/breast: negative Hematologic/lymphatic: negative Musculoskeletal:negative Neurological:  negative Behavioral/Psych: negative Endocrine: negative Allergic/Immunologic: negative  Physical Exam  ZJI:RCVEL, healthy, no distress, well nourished and well developed SKIN: skin color, texture, turgor are normal, no rashes or significant lesions HEAD: Normocephalic, No masses, lesions, tenderness or abnormalities EYES: normal, PERRLA, Conjunctiva are pink and non-injected EARS: External ears normal, Canals clear OROPHARYNX:no exudate, no erythema and lips, buccal mucosa, and tongue normal  NECK: supple, no adenopathy, no JVD LYMPH:  no palpable lymphadenopathy, no hepatosplenomegaly BREAST:not examined LUNGS: clear to auscultation , and palpation HEART: regular rate & rhythm, no murmurs and no gallops ABDOMEN:abdomen soft, non-tender, obese, normal bowel sounds and no masses or organomegaly BACK: Back symmetric, no curvature., No CVA tenderness EXTREMITIES:no joint deformities, effusion, or inflammation, no edema, no skin discoloration  NEURO: alert & oriented x 3 with fluent speech, no focal motor/sensory deficits  PERFORMANCE STATUS: ECOG 1  LABORATORY DATA: Lab Results  Component Value Date   WBC 8.4 03/24/2015   HGB 13.7 03/24/2015   HCT 40.3 03/24/2015   MCV 88.8 03/24/2015   PLT 242 03/24/2015      Chemistry      Component Value Date/Time   NA 138 03/24/2015 1446   NA 132* 03/09/2015 0700   K 4.6 03/24/2015 1446   K 4.4 03/09/2015 0700   CL 99* 03/09/2015 0700   CO2 29 03/24/2015 1446   CO2 24 03/09/2015 0700   BUN 31.2* 03/24/2015 1446   BUN 33* 03/09/2015 0700   CREATININE 2.1* 03/24/2015 1446   CREATININE 1.50* 03/09/2015 0700      Component Value Date/Time   CALCIUM 9.8 03/24/2015 1446   CALCIUM 9.6 03/09/2015 0700   ALKPHOS 120 03/24/2015 1446   AST 19 03/24/2015 1446   ALT 11 03/24/2015 1446   BILITOT 1.59* 03/24/2015 1446       RADIOGRAPHIC STUDIES: Dg Chest 1 View  03/09/2015   CLINICAL DATA:  69 year old Kristy [name] status post right lung  biopsy. Initial encounter.  EXAM: CHEST  1 VIEW  COMPARISON:  CT-guided biopsy images 0852 hr today, and earlier  FINDINGS: Portable AP upright view at 1058 hrs. No pneumothorax. Stable round right lower lung mass. Stable lung volumes. Stable cardiac size and mediastinal contours. Chronic increased interstitial markings.  IMPRESSION: No pneumothorax or acute findings status post CT-guided right lung mass biopsy.   Electronically Signed   By: Genevie Ann M.D.   On: 03/09/2015 11:08   Nm Pet Image Initial (pi) Skull Base To Thigh  03/08/2015   CLINICAL DATA:  Initial treatment strategy for lung mass.  EXAM: NUCLEAR MEDICINE PET SKULL  BASE TO THIGH  TECHNIQUE: 9.3 mCi F-18 FDG was injected intravenously. Full-ring PET imaging was performed from the skull base to thigh after the radiotracer. CT data was obtained and used for attenuation correction and anatomic localization.  FASTING BLOOD GLUCOSE:  Value: 121 mg/dl  COMPARISON:  None available  FINDINGS: NECK  No hypermetabolic lymph nodes in the neck.  CHEST  Hypermetabolic mass in the superior segment of the RIGHT lower lobe measures 6.1 by 4.2 cm with intense metabolic activity (SUV max equal 34).  Cluster of hypermetabolic RIGHT paratracheal lymph nodes. For example 17 mm short axis lymph node on image 53 with SUV max 25.6.  Hypermetabolic RIGHT hilar lymph nodes which are difficult to define on this noncontrast CT. There is a hypermetabolic lymph node along the esophagus inferior to the carina (image 62 of fused data set.)  ABDOMEN/PELVIS  No abnormal hypermetabolic activity within the liver, pancreas, adrenal glands, or spleen. No hypermetabolic lymph nodes in the abdomen or pelvis.  SKELETON  Single hypermetabolic focus associated with the anterior medial RIGHT sixth rib on image 81 of the fused data set. There is no corresponding CT abnormality.  IMPRESSION: 1. 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 2.  Hypermetabolic mediastinal nodal metastasis to the RIGHT paratracheal nodal station as well a paraesophageal nodal metastasis inferior to carina. 3. Metastatic adenopathy to the RIGHT hilum. 4. Single focus of metabolic activity associated with the anterior RIGHT sixth rib without CT abnormality. Recommend correlation with trauma and if no such history, consider a solitary metastatic lesion.   Electronically Signed   By: Suzy Bouchard M.D.   On: 03/08/2015 15:16   Ct Biopsy  03/09/2015   CLINICAL DATA:  6 cm mass of the right lower lobe with metastatic lymphadenopathy by PET scan. The patient presents for percutaneous biopsy of the lung mass.  EXAM: CT GUIDED CORE BIOPSY OF RIGHT LUNG MASS  ANESTHESIA/SEDATION: 1.0  Mg IV Versed; 25 mcg IV Fentanyl  Total Moderate Sedation Time: 13 minutes.  MEDICATIONS: 10 mg IV hydralazine  PROCEDURE: The procedure risks, benefits, and alternatives were explained to the patient. Questions regarding the procedure were encouraged and answered. The patient understands and consents to the procedure. A time-out was performed prior to the procedure.  CT was performed in a prone position through the lower chest. The right posterior chest wall was prepped with Betadine in a sterile fashion, and a sterile drape was applied covering the operative field. A sterile gown and sterile gloves were used for the procedure. Local anesthesia was provided with 1% Lidocaine.  Under CT guidance, a 17 gauge trocar needle was advanced from a posterior approach into the right lower lung. After confirming needle tip position, coaxial 18 gauge core biopsy samples were obtained. A total of 3 samples were obtained and submitted in formalin. The outer needle was removed and postprocedural CT performed.  COMPLICATIONS: Tiny posterior pneumothorax.  SIR level A: No therapy, no consequence.  FINDINGS: The 6 cm posterior right lower lobe mass was localized. Fragmented solid tissue was obtained with core  biopsy. Postprocedure CT shows a small amount of air in the posterior pleural space adjacent to the mass. This was asymptomatic. Followup chest x-ray revealed no pneumothorax during recovery.  IMPRESSION: CT-guided core biopsy of 6 cm right lower lobe lung mass. The procedure was complicated by a tiny posterior pneumothorax which was asymptomatic. No pneumothorax with seen on followup chest x-ray 2 hours after the procedure.  Electronically Signed   By: Aletta Edouard M.D.   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   PLAN: I had a lengthy discussion with the patient and her husband today about her current disease stage, prognosis and treatment options. We will complete the staging workup by ordering a MRI of the brain to rule out brain metastasis. This is scheduled to be performed next week. I had a lengthy discussion with the patient and her husband about her treatment options. I recommended for the patient a course of concurrent chemoradiation with weekly carboplatin for AUC of 2 and paclitaxel 45 MG/M2. I discussed with the patient adverse effect of the chemotherapy including but not limited to alopecia, myelosuppression, nausea and vomiting, peripheral neuropathy, liver or renal dysfunction. I will arrange for the patient to have a chemotherapy education class before starting the first dose of his chemotherapy. The patient will be seen by Dr. Tammi Klippel later today for evaluation and discussion of the radiotherapy option. She is expected to start the first cycle of her concurrent chemoradiation on 04/04/2015. I will call her pharmacy with prescription for Compazine 10 mg by mouth every 6 hours as needed for  nausea. For pain management, I gave the patient refill of tramadol 50 mg by mouth every 6 hours as needed for pain. The patient would come back for follow-up visit in 3 weeks for reevaluation and management of any adverse effect of her treatment. She was seen during the multidisciplinary thoracic oncology clinic today by medical oncology, radiation oncology, thoracic navigator, social worker and physical therapist. The patient voices understanding of current disease status and treatment options and is in agreement with the current care plan.  All questions were answered. The patient knows to call the clinic with any problems, questions or concerns. We can certainly see the patient much sooner if necessary.  Thank you so much for allowing me to participate in the care of Jefferson. I will continue to follow up the patient with you and assist in her care.  I spent 55 minutes counseling the patient face to face. The total time spent in the appointment was 80 minutes.  Disclaimer: This note was dictated with voice recognition software. Similar sounding words can inadvertently be transcribed and may not be corrected upon review.   Kristy Kristy [name] K. March 24, 2015, 4:30 PM

## 2015-03-25 ENCOUNTER — Telehealth: Payer: Self-pay | Admitting: *Deleted

## 2015-03-25 ENCOUNTER — Telehealth: Payer: Self-pay | Admitting: Internal Medicine

## 2015-03-25 NOTE — Telephone Encounter (Signed)
s.w. pt and advised on appt...pt ok and aware °

## 2015-03-25 NOTE — Telephone Encounter (Signed)
Per staff message and POF I have scheduled appts. Advised scheduler of appts and to move lab . JMW  

## 2015-03-25 NOTE — Telephone Encounter (Signed)
s.w. pt dtr in law and confirmed all appts....pt ok and aware

## 2015-03-28 ENCOUNTER — Ambulatory Visit (HOSPITAL_COMMUNITY)
Admission: RE | Admit: 2015-03-28 | Discharge: 2015-03-28 | Disposition: A | Discharge status: Home / Self Care | Payer: Medicare Other | Source: Ambulatory Visit | Attending: Radiation Oncology | Admitting: Radiation Oncology

## 2015-03-28 ENCOUNTER — Other Ambulatory Visit: Payer: Self-pay | Admitting: Radiation Oncology

## 2015-03-28 DIAGNOSIS — R918 Other nonspecific abnormal finding of lung field: Secondary | ICD-10-CM

## 2015-03-30 ENCOUNTER — Telehealth: Payer: Self-pay | Admitting: Internal Medicine

## 2015-03-30 NOTE — Telephone Encounter (Signed)
returned call and advised pt to call back to r/s appts

## 2015-03-31 ENCOUNTER — Other Ambulatory Visit: Payer: Medicare Other

## 2015-03-31 ENCOUNTER — Telehealth: Payer: Self-pay | Admitting: Internal Medicine

## 2015-03-31 NOTE — Telephone Encounter (Signed)
pt came in stated was in [place] and was late for chemo class-cld chemo edu no one avail;abe-r/s pt prior to chemo 9/26-pt understood

## 2015-04-04 ENCOUNTER — Ambulatory Visit (HOSPITAL_BASED_OUTPATIENT_CLINIC_OR_DEPARTMENT_OTHER): Payer: Medicare Other

## 2015-04-04 ENCOUNTER — Other Ambulatory Visit: Payer: Medicare Other

## 2015-04-04 ENCOUNTER — Telehealth: Payer: Self-pay | Admitting: Oncology

## 2015-04-04 ENCOUNTER — Other Ambulatory Visit: Payer: Self-pay | Admitting: *Deleted

## 2015-04-04 VITALS — BP 149/89 | HR 70 | Temp 97.7°F | Resp 18

## 2015-04-04 DIAGNOSIS — Z5111 Encounter for antineoplastic chemotherapy: Secondary | ICD-10-CM | POA: Diagnosis present

## 2015-04-04 DIAGNOSIS — C3431 Malignant neoplasm of lower lobe, right bronchus or lung: Secondary | ICD-10-CM

## 2015-04-04 LAB — CBC WITH DIFFERENTIAL/PLATELET
BASO%: 0.3 % (ref 0.0–2.0)
BASOS ABS: 0 10*3/uL (ref 0.0–0.1)
EOS%: 2.8 % (ref 0.0–7.0)
Eosinophils Absolute: 0.2 10*3/uL (ref 0.0–0.5)
HEMATOCRIT: 40.4 % (ref 34.8–46.6)
HEMOGLOBIN: 13.6 g/dL (ref 11.6–15.9)
LYMPH#: 0.5 10*3/uL — AB (ref 0.9–3.3)
LYMPH%: 6 % — ABNORMAL LOW (ref 14.0–49.7)
MCH: 30.4 pg (ref 25.1–34.0)
MCHC: 33.8 g/dL (ref 31.5–36.0)
MCV: 90.2 fL (ref 79.5–101.0)
MONO#: 0.5 10*3/uL (ref 0.1–0.9)
MONO%: 6.1 % (ref 0.0–14.0)
NEUT#: 7.2 10*3/uL — ABNORMAL HIGH (ref 1.5–6.5)
NEUT%: 84.8 % — ABNORMAL HIGH (ref 38.4–76.8)
Platelets: 218 10*3/uL (ref 145–400)
RBC: 4.48 10*6/uL (ref 3.70–5.45)
RDW: 17.6 % — AB (ref 11.2–14.5)
WBC: 8.5 10*3/uL (ref 3.9–10.3)

## 2015-04-04 LAB — COMPREHENSIVE METABOLIC PANEL (CC13)
ALBUMIN: 3.2 g/dL — AB (ref 3.5–5.0)
ALT: 13 U/L (ref 0–55)
AST: 19 U/L (ref 5–34)
Alkaline Phosphatase: 106 U/L (ref 40–150)
Anion Gap: 7 mEq/L (ref 3–11)
BUN: 30.8 mg/dL — AB (ref 7.0–26.0)
CALCIUM: 9.6 mg/dL (ref 8.4–10.4)
CHLORIDE: 105 meq/L (ref 98–109)
CO2: 27 mEq/L (ref 22–29)
CREATININE: 2 mg/dL — AB (ref 0.6–1.1)
EGFR: 25 mL/min/{1.73_m2} — ABNORMAL LOW (ref >90)
GLUCOSE: 107 mg/dL (ref 70–140)
POTASSIUM: 4.3 meq/L (ref 3.5–5.1)
SODIUM: 139 meq/L (ref 136–145)
Total Bilirubin: 1.66 mg/dL — ABNORMAL HIGH (ref 0.20–1.20)
Total Protein: 6.3 g/dL — ABNORMAL LOW (ref 6.4–8.3)

## 2015-04-04 MED ORDER — SODIUM CHLORIDE 0.9 % IV SOLN
Freq: Once | INTRAVENOUS | Status: AC
  Administered 2015-04-04: 14:00:00 via INTRAVENOUS
  Filled 2015-04-04: qty 8

## 2015-04-04 MED ORDER — FAMOTIDINE IN NACL 20-0.9 MG/50ML-% IV SOLN
20.0000 mg | Freq: Once | INTRAVENOUS | Status: AC
  Administered 2015-04-04: 20 mg via INTRAVENOUS

## 2015-04-04 MED ORDER — PACLITAXEL CHEMO INJECTION 300 MG/50ML
45.0000 mg/m2 | Freq: Once | INTRAVENOUS | Status: AC
  Administered 2015-04-04: 90 mg via INTRAVENOUS
  Filled 2015-04-04: qty 15

## 2015-04-04 MED ORDER — DIPHENHYDRAMINE HCL 50 MG/ML IJ SOLN
INTRAMUSCULAR | Status: AC
  Filled 2015-04-04: qty 1

## 2015-04-04 MED ORDER — DIPHENHYDRAMINE HCL 50 MG/ML IJ SOLN
50.0000 mg | Freq: Once | INTRAMUSCULAR | Status: AC
  Administered 2015-04-04: 50 mg via INTRAVENOUS

## 2015-04-04 MED ORDER — SODIUM CHLORIDE 0.9 % IV SOLN
Freq: Once | INTRAVENOUS | Status: AC
  Administered 2015-04-04: 14:00:00 via INTRAVENOUS

## 2015-04-04 MED ORDER — SODIUM CHLORIDE 0.9 % IV SOLN
116.8000 mg | Freq: Once | INTRAVENOUS | Status: AC
  Administered 2015-04-04: 120 mg via INTRAVENOUS
  Filled 2015-04-04: qty 12

## 2015-04-04 MED ORDER — FAMOTIDINE IN NACL 20-0.9 MG/50ML-% IV SOLN
INTRAVENOUS | Status: AC
  Filled 2015-04-04: qty 50

## 2015-04-04 NOTE — Telephone Encounter (Signed)
Amy, RN called from [facility] and said that Kristy [name] is wondering when her CT [facility] for radiation will be.  She lives in [place], [place] which is about a 2 hour drive.  She can stay with family if her CT SIM is going to be soon.  Advised her that we will call her in the morning about her CT SIM when Dr. Johny Shears nurse is in the clinic.  Her phone number is [phone].  Also gave her radiation oncology number to call if needed.

## 2015-04-04 NOTE — Patient Instructions (Signed)
Cancer Center Discharge Instructions for Patients Receiving Chemotherapy  Today you received the following chemotherapy agents Taxol and Carboplatin  To help prevent nausea and vomiting after your treatment, we encourage you to take your nausea medication Compazine 10 mg every 6 hours as needed.   If you develop nausea and vomiting that is not controlled by your nausea medication, call the clinic.   BELOW ARE SYMPTOMS THAT SHOULD BE REPORTED IMMEDIATELY:  *FEVER GREATER THAN 100.5 F  *CHILLS WITH OR WITHOUT FEVER  NAUSEA AND VOMITING THAT IS NOT CONTROLLED WITH YOUR NAUSEA MEDICATION  *UNUSUAL SHORTNESS OF BREATH  *UNUSUAL BRUISING OR BLEEDING  TENDERNESS IN MOUTH AND THROAT WITH OR WITHOUT PRESENCE OF ULCERS  *URINARY PROBLEMS  *BOWEL PROBLEMS  UNUSUAL RASH Items with * indicate a potential emergency and should be followed up as soon as possible.  Feel free to call the clinic you have any questions or concerns. The clinic phone number is [phone].  Please show the CHEMO ALERT CARD at check-in to the Emergency Department and triage nurse.   

## 2015-04-04 NOTE — Progress Notes (Signed)
OKay to treat with labs today per Dr. Julien Nordmann  1610 per Eulas Post, RN; pt up to [facility] with difficulty; weak standing/pivoting.  Eulas Post, RN with pt in [place]--IV in left wrist came out a little and then was readjusted.  1620 Pt reports IV hurting; slightly red and swelling; Taxol stopped; IV taken out with cold pack applied and wrapped in towel.  Educated pt and husband re: infiltration of IV into tissue; will need to come in 9/27 for monitoring of site; instructions given to keep ice pack on/off this evening; if hot to touch; more redness; pain--call [facility] [phone] for more instructions.  Pt and husband verbalized understanding.  They are to call for am appt to check site in the morning d/t they are staying at their son's house tonight (they live in [place] 2 1/2 hours away)  1735 Left wrist infiltrate site cd&intact; slight redness; pt denies pain. Re-iterated instructions above and pt again verbalized understanding that they will be here tomorrow for site check.

## 2015-04-05 ENCOUNTER — Telehealth: Payer: Self-pay | Admitting: *Deleted

## 2015-04-05 ENCOUNTER — Ambulatory Visit: Payer: Medicare Other

## 2015-04-05 ENCOUNTER — Telehealth: Payer: Self-pay | Admitting: Radiation Oncology

## 2015-04-05 ENCOUNTER — Telehealth: Payer: Self-pay | Admitting: Internal Medicine

## 2015-04-05 NOTE — Telephone Encounter (Signed)
-----   Message from Domenic Schwab, RN sent at 04/04/2015  7:15 PM EDT ----- Regarding: Mohamed--Chemo F/U 1st Taxol/Carbo.  Please ask about IV in left wrist that infiltrated if possible.  Pt will be back 9/27 for site check.

## 2015-04-05 NOTE — Telephone Encounter (Signed)
Returned patient's call. No answer. No option to leave a message.

## 2015-04-05 NOTE — Telephone Encounter (Signed)
Chemo follow up call to pt, unable to reach lmovm to call office with any concerns.

## 2015-04-05 NOTE — Telephone Encounter (Signed)
Per pof add a flush today,patient will call in to get appointment time due to no home phone per pof  anne

## 2015-04-05 NOTE — Telephone Encounter (Signed)
Received triage VM at [phone] (Retrieved 1030am) regarding 1st radiation tx; per 9/27 note from [facility] in radiation, no orders were in and message sent to Dr. Lisbeth Renshaw.  Pt does have flush appt at 1230 for IV infiltration check. Unable to reach pt at this time; Mary RN made aware of situation also

## 2015-04-05 NOTE — Progress Notes (Signed)
IV site unremarkable left wrist . No evidence of infiltration . Pt instructed to call for any problems or concerns with IV site.

## 2015-04-06 NOTE — Telephone Encounter (Signed)
I have penciled her in for 2:30 pm on Friday.  Can you see if that will work for her?

## 2015-04-08 ENCOUNTER — Ambulatory Visit
Admission: RE | Admit: 2015-04-08 | Discharge: 2015-04-08 | Disposition: A | Discharge status: Home / Self Care | Payer: Medicare Other | Source: Ambulatory Visit | Attending: Radiation Oncology | Admitting: Radiation Oncology

## 2015-04-08 ENCOUNTER — Telehealth: Payer: Self-pay | Admitting: *Deleted

## 2015-04-08 ENCOUNTER — Ambulatory Visit: Payer: Medicare Other | Admitting: Radiation Oncology

## 2015-04-08 DIAGNOSIS — Z510 Encounter for antineoplastic radiation therapy: Secondary | ICD-10-CM | POA: Diagnosis not present

## 2015-04-08 DIAGNOSIS — R0602 Shortness of breath: Secondary | ICD-10-CM | POA: Insufficient documentation

## 2015-04-08 DIAGNOSIS — R05 Cough: Secondary | ICD-10-CM | POA: Diagnosis not present

## 2015-04-08 DIAGNOSIS — C3431 Malignant neoplasm of lower lobe, right bronchus or lung: Secondary | ICD-10-CM | POA: Diagnosis present

## 2015-04-08 NOTE — Telephone Encounter (Signed)
Oncology Nurse Navigator Documentation  Oncology Nurse Navigator Flowsheets 04/08/2015  Navigator Encounter Type Telephone/I received an in basket message from Dr. Tammi Klippel yesterday requesting me to notify patient of appt for [facility] today.  I was at a conference yesterday but received message this am.  I called patient and she is able to make the appt.  She verbalized understanding of appt time and place.   Patient Visit Type Follow-up  Education -  Interventions Coordination of Care  Coordination of Care MD Appointments  Education Method -  Time Spent with Patient 30

## 2015-04-08 NOTE — Progress Notes (Signed)
  Radiation Oncology         [phone] ________________________________  Name: Kristy [name] MRN: [id number]  Date: 04/08/2015  DOB: [birth year]  SIMULATION AND TREATMENT PLANNING NOTE    ICD-9-CM ICD-10-CM   1. 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  Identity was confirmed.  All relevant records and images related to the planned course of therapy were reviewed.  The patient freely provided informed written consent to proceed with treatment after reviewing the details related to the planned course of therapy. The consent form was witnessed and verified by the simulation staff.  Then, the patient was set-up in a stable reproducible  supine position for radiation therapy.  CT images were obtained.  Surface markings were placed.  The CT images were loaded into the planning software.  Then the target and avoidance structures were contoured.  Treatment planning then occurred.  The radiation prescription was entered and confirmed.  Then, I designed and supervised the construction of a total of 6 medically necessary complex treatment devices including BodyFix immobilization and 5 MLC apertures to shield lungs, spinal cord and heart.  I have requested : 3D Simulation  I have requested a DVH of the following structures: heart, left lung, right lung, esophagus, spinal cord and targets.  I have ordered:Nutrition Consult  SPECIAL TREATMENT PROCEDURE:  The planned course of therapy using radiation constitutes a special treatment procedure. Special care is required in the management of this patient for the following reasons. This treatment constitutes a Special Treatment Procedure for the following reason: [ Concurrent chemotherapy requiring careful monitoring for increased toxicities of treatment including weekly laboratory values..  The  special nature of the planned course of radiotherapy will require increased physician supervision and oversight to ensure patient's safety with optimal treatment outcomes.   PLAN:  The patient will receive 66 Gy in 33 fraction.  This document serves as a record of services personally performed by Tyler Pita, MD. It was created on his behalf by Lenn Cal, a trained medical scribe. The creation of this record is based on the scribe's personal observations and the provider's statements to them. This document has been checked and approved by the attending provider.  ________________________________  [name]. Tammi Klippel, M.D.

## 2015-04-11 ENCOUNTER — Other Ambulatory Visit (HOSPITAL_BASED_OUTPATIENT_CLINIC_OR_DEPARTMENT_OTHER): Payer: Medicare Other

## 2015-04-11 ENCOUNTER — Ambulatory Visit (HOSPITAL_BASED_OUTPATIENT_CLINIC_OR_DEPARTMENT_OTHER): Payer: Medicare Other

## 2015-04-11 ENCOUNTER — Encounter: Payer: Self-pay | Admitting: *Deleted

## 2015-04-11 VITALS — BP 145/74 | HR 98 | Temp 97.4°F | Resp 20

## 2015-04-11 DIAGNOSIS — C3431 Malignant neoplasm of lower lobe, right bronchus or lung: Secondary | ICD-10-CM

## 2015-04-11 DIAGNOSIS — Z5111 Encounter for antineoplastic chemotherapy: Secondary | ICD-10-CM | POA: Diagnosis present

## 2015-04-11 LAB — CBC WITH DIFFERENTIAL/PLATELET
BASO%: 0.3 % (ref 0.0–2.0)
Basophils Absolute: 0 10*3/uL (ref 0.0–0.1)
EOS ABS: 0.2 10*3/uL (ref 0.0–0.5)
EOS%: 2.4 % (ref 0.0–7.0)
HEMATOCRIT: 41.1 % (ref 34.8–46.6)
HEMOGLOBIN: 14.1 g/dL (ref 11.6–15.9)
LYMPH%: 7.1 % — ABNORMAL LOW (ref 14.0–49.7)
MCH: 30.7 pg (ref 25.1–34.0)
MCHC: 34.3 g/dL (ref 31.5–36.0)
MCV: 89.3 fL (ref 79.5–101.0)
MONO#: 0.1 10*3/uL (ref 0.1–0.9)
MONO%: 1.3 % (ref 0.0–14.0)
NEUT%: 88.9 % — ABNORMAL HIGH (ref 38.4–76.8)
NEUTROS ABS: 5.5 10*3/uL (ref 1.5–6.5)
NRBC: 0 % (ref 0–0)
PLATELETS: 174 10*3/uL (ref 145–400)
RBC: 4.6 10*6/uL (ref 3.70–5.45)
RDW: 16.1 % — AB (ref 11.2–14.5)
WBC: 6.2 10*3/uL (ref 3.9–10.3)
lymph#: 0.4 10*3/uL — ABNORMAL LOW (ref 0.9–3.3)

## 2015-04-11 LAB — COMPREHENSIVE METABOLIC PANEL (CC13)
ALT: 12 U/L (ref 0–55)
ANION GAP: 7 meq/L (ref 3–11)
AST: 16 U/L (ref 5–34)
Albumin: 3.2 g/dL — ABNORMAL LOW (ref 3.5–5.0)
Alkaline Phosphatase: 84 U/L (ref 40–150)
BILIRUBIN TOTAL: 1.67 mg/dL — AB (ref 0.20–1.20)
BUN: 34.4 mg/dL — ABNORMAL HIGH (ref 7.0–26.0)
CALCIUM: 9.3 mg/dL (ref 8.4–10.4)
CO2: 23 meq/L (ref 22–29)
CREATININE: 1.6 mg/dL — AB (ref 0.6–1.1)
Chloride: 109 mEq/L (ref 98–109)
EGFR: 32 mL/min/{1.73_m2} — ABNORMAL LOW (ref >90)
Glucose: 131 mg/dl (ref 70–140)
Potassium: 4.4 mEq/L (ref 3.5–5.1)
Sodium: 139 mEq/L (ref 136–145)
TOTAL PROTEIN: 6.3 g/dL — AB (ref 6.4–8.3)

## 2015-04-11 MED ORDER — DIPHENHYDRAMINE HCL 50 MG/ML IJ SOLN
INTRAMUSCULAR | Status: AC
  Filled 2015-04-11: qty 1

## 2015-04-11 MED ORDER — DEXTROSE 5 % IV SOLN
45.0000 mg/m2 | Freq: Once | INTRAVENOUS | Status: AC
  Administered 2015-04-11: 90 mg via INTRAVENOUS
  Filled 2015-04-11: qty 15

## 2015-04-11 MED ORDER — SODIUM CHLORIDE 0.9 % IV SOLN
Freq: Once | INTRAVENOUS | Status: AC
  Administered 2015-04-11: 12:00:00 via INTRAVENOUS
  Filled 2015-04-11: qty 8

## 2015-04-11 MED ORDER — DIPHENHYDRAMINE HCL 50 MG/ML IJ SOLN
50.0000 mg | Freq: Once | INTRAMUSCULAR | Status: AC
  Administered 2015-04-11: 50 mg via INTRAVENOUS

## 2015-04-11 MED ORDER — FAMOTIDINE IN NACL 20-0.9 MG/50ML-% IV SOLN
20.0000 mg | Freq: Once | INTRAVENOUS | Status: AC
  Administered 2015-04-11: 20 mg via INTRAVENOUS

## 2015-04-11 MED ORDER — SODIUM CHLORIDE 0.9 % IV SOLN
116.8000 mg | Freq: Once | INTRAVENOUS | Status: AC
  Administered 2015-04-11: 120 mg via INTRAVENOUS
  Filled 2015-04-11: qty 12

## 2015-04-11 MED ORDER — SODIUM CHLORIDE 0.9 % IV SOLN
Freq: Once | INTRAVENOUS | Status: AC
  Administered 2015-04-11: 12:00:00 via INTRAVENOUS

## 2015-04-11 MED ORDER — FAMOTIDINE IN NACL 20-0.9 MG/50ML-% IV SOLN
INTRAVENOUS | Status: AC
  Filled 2015-04-11: qty 50

## 2015-04-11 NOTE — Patient Instructions (Signed)
[facility] Discharge Instructions for Patients Receiving Chemotherapy  Today you received the following chemotherapy agents Taxol/Carboplatin  To help prevent nausea and vomiting after your treatment, we encourage you to take your nausea medication    If you develop nausea and vomiting that is not controlled by your nausea medication, call the clinic.   BELOW ARE SYMPTOMS THAT SHOULD BE REPORTED IMMEDIATELY:  *FEVER GREATER THAN 100.5 F  *CHILLS WITH OR WITHOUT FEVER  NAUSEA AND VOMITING THAT IS NOT CONTROLLED WITH YOUR NAUSEA MEDICATION  *UNUSUAL SHORTNESS OF BREATH  *UNUSUAL BRUISING OR BLEEDING  TENDERNESS IN MOUTH AND THROAT WITH OR WITHOUT PRESENCE OF ULCERS  *URINARY PROBLEMS  *BOWEL PROBLEMS  UNUSUAL RASH Items with * indicate a potential emergency and should be followed up as soon as possible.  Feel free to call the clinic you have any questions or concerns. The clinic phone number is [phone].  Please show the CHEMO ALERT CARD at check-in to the Emergency Department and triage nurse.   

## 2015-04-11 NOTE — Progress Notes (Signed)
Oncology Nurse Navigator Documentation  Oncology Nurse Navigator Flowsheets 04/11/2015  Navigator Encounter Type Clinic/MDC;Treatment Spoke with patient today. She is doing well without complaints.  I asked if she had completed lung cancer initiative gas card.  She has not.  I helped her completed application and faxed.  She was thankful for the help.   Patient Visit Type Follow-up;Medonc  Treatment Phase Treatment  Barriers/Navigation Needs Financial  Education Other  Interventions -  Coordination of Care -  Education Method -  Time Spent with Patient 30

## 2015-04-14 DIAGNOSIS — R05 Cough: Secondary | ICD-10-CM | POA: Diagnosis not present

## 2015-04-14 DIAGNOSIS — Z510 Encounter for antineoplastic radiation therapy: Secondary | ICD-10-CM | POA: Diagnosis not present

## 2015-04-14 DIAGNOSIS — R0602 Shortness of breath: Secondary | ICD-10-CM | POA: Diagnosis not present

## 2015-04-14 DIAGNOSIS — C3431 Malignant neoplasm of lower lobe, right bronchus or lung: Secondary | ICD-10-CM | POA: Diagnosis present

## 2015-04-15 ENCOUNTER — Ambulatory Visit
Admission: RE | Admit: 2015-04-15 | Discharge: 2015-04-15 | Disposition: A | Discharge status: Home / Self Care | Payer: Medicare Other | Source: Ambulatory Visit | Attending: Radiation Oncology | Admitting: Radiation Oncology

## 2015-04-15 DIAGNOSIS — C3431 Malignant neoplasm of lower lobe, right bronchus or lung: Secondary | ICD-10-CM

## 2015-04-15 DIAGNOSIS — Z510 Encounter for antineoplastic radiation therapy: Secondary | ICD-10-CM | POA: Diagnosis not present

## 2015-04-18 ENCOUNTER — Ambulatory Visit (HOSPITAL_BASED_OUTPATIENT_CLINIC_OR_DEPARTMENT_OTHER): Payer: Medicare Other

## 2015-04-18 ENCOUNTER — Encounter: Payer: Self-pay | Admitting: Nurse Practitioner

## 2015-04-18 ENCOUNTER — Ambulatory Visit
Admission: RE | Admit: 2015-04-18 | Discharge: 2015-04-18 | Disposition: A | Discharge status: Home / Self Care | Payer: Medicare Other | Source: Ambulatory Visit | Attending: Radiation Oncology | Admitting: Radiation Oncology

## 2015-04-18 ENCOUNTER — Other Ambulatory Visit (HOSPITAL_BASED_OUTPATIENT_CLINIC_OR_DEPARTMENT_OTHER): Payer: Medicare Other

## 2015-04-18 ENCOUNTER — Other Ambulatory Visit: Payer: Self-pay | Admitting: Nurse Practitioner

## 2015-04-18 ENCOUNTER — Ambulatory Visit (HOSPITAL_BASED_OUTPATIENT_CLINIC_OR_DEPARTMENT_OTHER): Payer: Medicare Other | Admitting: Nurse Practitioner

## 2015-04-18 ENCOUNTER — Other Ambulatory Visit: Payer: Medicare Other

## 2015-04-18 VITALS — BP 156/86 | HR 72 | Temp 97.9°F | Resp 20

## 2015-04-18 DIAGNOSIS — I4891 Unspecified atrial fibrillation: Secondary | ICD-10-CM

## 2015-04-18 DIAGNOSIS — C3431 Malignant neoplasm of lower lobe, right bronchus or lung: Secondary | ICD-10-CM

## 2015-04-18 DIAGNOSIS — T8089XA Other complications following infusion, transfusion and therapeutic injection, initial encounter: Secondary | ICD-10-CM

## 2015-04-18 DIAGNOSIS — Z7901 Long term (current) use of anticoagulants: Secondary | ICD-10-CM | POA: Diagnosis not present

## 2015-04-18 DIAGNOSIS — IMO0002 Reserved for concepts with insufficient information to code with codable children: Secondary | ICD-10-CM | POA: Insufficient documentation

## 2015-04-18 DIAGNOSIS — Z5111 Encounter for antineoplastic chemotherapy: Secondary | ICD-10-CM

## 2015-04-18 DIAGNOSIS — Z510 Encounter for antineoplastic radiation therapy: Secondary | ICD-10-CM | POA: Diagnosis not present

## 2015-04-18 LAB — COMPREHENSIVE METABOLIC PANEL (CC13)
ALBUMIN: 3 g/dL — AB (ref 3.5–5.0)
ALK PHOS: 75 U/L (ref 40–150)
ALT: 10 U/L (ref 0–55)
AST: 15 U/L (ref 5–34)
Anion Gap: 8 mEq/L (ref 3–11)
BILIRUBIN TOTAL: 1.76 mg/dL — AB (ref 0.20–1.20)
BUN: 26 mg/dL (ref 7.0–26.0)
CO2: 23 mEq/L (ref 22–29)
Calcium: 9.2 mg/dL (ref 8.4–10.4)
Chloride: 109 mEq/L (ref 98–109)
Creatinine: 1.5 mg/dL — ABNORMAL HIGH (ref 0.6–1.1)
EGFR: 36 mL/min/{1.73_m2} — ABNORMAL LOW (ref >90)
GLUCOSE: 103 mg/dL (ref 70–140)
Potassium: 4.2 mEq/L (ref 3.5–5.1)
SODIUM: 139 meq/L (ref 136–145)
TOTAL PROTEIN: 5.8 g/dL — AB (ref 6.4–8.3)

## 2015-04-18 LAB — CBC WITH DIFFERENTIAL/PLATELET
BASO%: 2.3 % — ABNORMAL HIGH (ref 0.0–2.0)
Basophils Absolute: 0.1 10*3/uL (ref 0.0–0.1)
EOS ABS: 0 10*3/uL (ref 0.0–0.5)
EOS%: 1.4 % (ref 0.0–7.0)
HCT: 37.5 % (ref 34.8–46.6)
HEMOGLOBIN: 12.7 g/dL (ref 11.6–15.9)
LYMPH%: 12.3 % — ABNORMAL LOW (ref 14.0–49.7)
MCH: 30.6 pg (ref 25.1–34.0)
MCHC: 33.9 g/dL (ref 31.5–36.0)
MCV: 90.3 fL (ref 79.5–101.0)
MONO#: 0.1 10*3/uL (ref 0.1–0.9)
MONO%: 4.2 % (ref 0.0–14.0)
NEUT%: 79.8 % — ABNORMAL HIGH (ref 38.4–76.8)
NEUTROS ABS: 2.5 10*3/uL (ref 1.5–6.5)
Platelets: 141 10*3/uL — ABNORMAL LOW (ref 145–400)
RBC: 4.15 10*6/uL (ref 3.70–5.45)
RDW: 17.5 % — AB (ref 11.2–14.5)
WBC: 3.1 10*3/uL — ABNORMAL LOW (ref 3.9–10.3)
lymph#: 0.4 10*3/uL — ABNORMAL LOW (ref 0.9–3.3)

## 2015-04-18 MED ORDER — SODIUM CHLORIDE 0.9 % IV SOLN
116.8000 mg | Freq: Once | INTRAVENOUS | Status: AC
  Administered 2015-04-18: 120 mg via INTRAVENOUS
  Filled 2015-04-18: qty 12

## 2015-04-18 MED ORDER — DIPHENHYDRAMINE HCL 50 MG/ML IJ SOLN
INTRAMUSCULAR | Status: AC
  Filled 2015-04-18: qty 1

## 2015-04-18 MED ORDER — DEXAMETHASONE SODIUM PHOSPHATE 100 MG/10ML IJ SOLN
Freq: Once | INTRAMUSCULAR | Status: AC
  Administered 2015-04-18: 14:00:00 via INTRAVENOUS
  Filled 2015-04-18: qty 8

## 2015-04-18 MED ORDER — SODIUM CHLORIDE 0.9 % IV SOLN
Freq: Once | INTRAVENOUS | Status: AC
  Administered 2015-04-18: 14:00:00 via INTRAVENOUS

## 2015-04-18 MED ORDER — FAMOTIDINE IN NACL 20-0.9 MG/50ML-% IV SOLN
20.0000 mg | Freq: Once | INTRAVENOUS | Status: AC
  Administered 2015-04-18: 20 mg via INTRAVENOUS

## 2015-04-18 MED ORDER — DIPHENHYDRAMINE HCL 50 MG/ML IJ SOLN
50.0000 mg | Freq: Once | INTRAMUSCULAR | Status: AC
  Administered 2015-04-18: 25 mg via INTRAVENOUS

## 2015-04-18 MED ORDER — FAMOTIDINE IN NACL 20-0.9 MG/50ML-% IV SOLN
INTRAVENOUS | Status: AC
  Filled 2015-04-18: qty 50

## 2015-04-18 MED ORDER — PACLITAXEL CHEMO INJECTION 300 MG/50ML
45.0000 mg/m2 | Freq: Once | INTRAVENOUS | Status: AC
  Administered 2015-04-18: 90 mg via INTRAVENOUS
  Filled 2015-04-18: qty 15

## 2015-04-18 NOTE — Progress Notes (Signed)
OK to treat with total bili-1.76 per Dr. Julien Nordmann.

## 2015-04-18 NOTE — Progress Notes (Signed)
Ok to treat with increase total bili per Dr. Julien Nordmann.  Pt exhibiting erythema to left hand and forearm.  Pt states it started yesterday and feels like a sunburn.  IV started in opposite arm.  Michel Harrow, NP notified, will see patient under sx mgmt schedule in infusion room.  OK to proceed with treatment.

## 2015-04-18 NOTE — Progress Notes (Signed)
SYMPTOM MANAGEMENT CLINIC   HPI: Kristy [name] 69 y.o. female diagnosed with lung cancer.  Currently undergoing carboplatin/paclitaxel chemotherapy and radiation treatments.  Patient presented to the [facility] today to receive cycle 3 of her carboplatin/paclitaxel chemotherapy regimen.  She also continues to receive daily radiation treatments as directed.  Patient reports development of left arm erythema and rash within the past 24 hours.  She states that this is the location of her peripherally infused chemotherapy last week.  HPI  ROS  Past Medical History  Diagnosis Date  . Atrial fibrillation ([place]) 09/29/2008    Paroxysmal, amiodarone therapy with cardioversion to sinus rhythm in the past. / cardioversion on higher dose amiodarone successful October, 2010 / amiodarone stopped or pulmonary illness November, 2010, patient improved, amiodarone not restarted / regular rhythm January, 2011 / returned atrial fibrillation by March, 2011... plan rate control  . RENAL INSUFFICIENCY 09/29/2008    Creatinine 1.9  . VENTRICULAR HYPERTROPHY, LEFT 09/29/2008  . PULMONARY HYPERTENSION 09/29/2008  . ALLERGIC RHINITIS 09/29/2008  . HYPERCHOLESTEROLEMIA 09/29/2008  . ANXIETY DEPRESSION 09/29/2008  . OBESITY 09/29/2008  . GERD 09/29/2008  . HYPERTENSION 09/29/2008    No echo data as of April, 2011 ( normal LV function by history)  . PEPTIC ULCER DISEASE 09/29/2008  . THYROTOXICOSIS 09/29/2008  . CHEST DISCOMFORT 05/30/2009    In October,2010  ,??GI??  . CAD 10/20/2008    Catheterization [facility] 2001, 70% circumflex / nuclear April, 2010 no ischemia, not gated with atrial fib  . Cough 04/12/2009    Her lisinopril held October, 2010, cough resolved  . Warfarin anticoagulation   . Peptic ulcer disease   . Fluid overload     Improvement with Zaroxolyn July, 2011  . Shortness of breath     Amiodarone will not be restarted  /     Pulmonary illness November, 2010, abnormal CT, sedimentation rate not  elevated, Dr Melvyn Novas saw patient,, no desaturation walking April, 2011, plan to follow  . Chronic diastolic CHF (congestive heart failure) ([place])   . Ejection fraction     Past Surgical History  Procedure Laterality Date  . Tonsillectomy and adenoidectomy  1953  . Dilation and curettage of uterus  2004    x2  . Hernia repair  2003    has Warfarin anticoagulation; OBESITY; GERD; HYPERTENSION; CHEST DISCOMFORT; CAD; Cough; Fluid overload; Shortness of breath; Atrial fibrillation ([place]); RENAL INSUFFICIENCY; VENTRICULAR HYPERTROPHY, LEFT; PULMONARY HYPERTENSION; HYPERCHOLESTEROLEMIA; ANXIETY DEPRESSION; OBESITY; GERD; Chronic diastolic CHF (congestive heart failure) ([place]); Ejection fraction; Aortic valve sclerosis; squamous cell Cancer of lower lobe of right lung; Weakness of both legs; and Chemotherapeutic agent or infusion extravasation on her problem list.    is allergic to tramadol; amiodarone; atorvastatin; bystolic; cholestyramine; clonidine hydrochloride; lansoprazole; oxycodone-acetaminophen; penicillins; prevalite; propoxyphene hcl; propoxyphene n-acetaminophen; and aspirin.    Medication List       This list is accurate as of: 04/18/15  5:18 PM.  Always use your most recent med list.               allopurinol 300 MG tablet  Commonly known as:  ZYLOPRIM  Take 300 mg by mouth every morning.     atenolol 100 MG tablet  Commonly known as:  TENORMIN  Take 100 mg by mouth every morning.     diltiazem 240 MG 24 hr capsule  Commonly known as:  CARDIZEM CD  Take 1 capsule (240 mg total) by mouth daily.     docusate sodium 100 MG  capsule  Commonly known as:  COLACE  Take 100 mg by mouth daily as needed for mild constipation.     furosemide 80 MG tablet  Commonly known as:  LASIX  Take 40 mg by mouth every morning.     HYDROcodone-acetaminophen 5-325 MG tablet  Commonly known as:  NORCO/VICODIN  Take 1 tablet by mouth every 6 (six) hours as needed for moderate pain.      ibuprofen 200 MG tablet  Commonly known as:  ADVIL,MOTRIN  Take 200 mg by mouth every 6 (six) hours as needed.     latanoprost 0.005 % ophthalmic solution  Commonly known as:  XALATAN  Place 1 drop into both eyes at bedtime.     levothyroxine 150 MCG tablet  Commonly known as:  SYNTHROID, LEVOTHROID  Take 150 mcg by mouth every morning.     lisinopril 40 MG tablet  Commonly known as:  PRINIVIL,ZESTRIL  Take 40 mg by mouth every morning.     LORazepam 0.5 MG tablet  Commonly known as:  ATIVAN  Take 0.5 mg by mouth at bedtime.     minoxidil 2.5 MG tablet  Commonly known as:  LONITEN  Take 1 tablet by mouth every morning.     omeprazole 20 MG capsule  Commonly known as:  PRILOSEC  Take 20 mg by mouth every morning.     prochlorperazine 10 MG tablet  Commonly known as:  COMPAZINE  Take 1 tablet (10 mg total) by mouth every 6 (six) hours as needed for nausea or vomiting.     traMADol 50 MG tablet  Commonly known as:  ULTRAM  Take 1 tablet (50 mg total) by mouth every 6 (six) hours as needed.     traZODone 50 MG tablet  Commonly known as:  DESYREL  Take 1 tablet by mouth every evening.     warfarin 5 MG tablet  Commonly known as:  COUMADIN  Take 5 mg by mouth at bedtime.         PHYSICAL EXAMINATION  Oncology Vitals 04/18/2015 04/11/2015 04/04/2015 04/04/2015 04/04/2015 04/04/2015 04/04/2015  Height - - - - - - -  Weight - - - - - - -  Weight (lbs) - - - - - - -  BMI (kg/m2) - - - - - - -  Temp 97.9 97.4 97.7 97.2 97.5 97.7 -  Pulse 72 98 70 87 63 61 78  Resp $Rem'20 20 18 19 19 19 18  'DuGp$ SpO2 99 100 100 100 97 99 96  BSA (m2) - - - - - - -   BP Readings from Last 3 Encounters:  04/18/15 156/86  04/11/15 145/74  04/04/15 149/89    Physical Exam  Constitutional: She is oriented to person, place, and time and well-developed, well-nourished, and in no distress.  HENT:  Head: Normocephalic and atraumatic.  Eyes: Conjunctivae and EOM are normal. Pupils are equal, round,  and reactive to light. Right eye exhibits no discharge. Left eye exhibits no discharge. No scleral icterus.  Neck: Normal range of motion.  Pulmonary/Chest: Effort normal. No respiratory distress.  Musculoskeletal: Normal range of motion.  Neurological: She is alert and oriented to person, place, and time.  Skin: Skin is warm and dry. Rash noted. There is erythema. No pallor.  It does appear on exam.  The patient has a probable chemotherapy extravasation.  The left forearm and hand with erythema and rash that extends to her hand.  There is no warmth, tenderness, or red streaks.  Psychiatric: Affect normal.  Nursing note and vitals reviewed.   LABORATORY DATA:. Appointment on 04/18/2015  Component Date Value Ref Range Status  . WBC 04/18/2015 3.1* 3.9 - 10.3 10e3/uL Final  . NEUT# 04/18/2015 2.5  1.5 - 6.5 10e3/uL Final  . HGB 04/18/2015 12.7  11.6 - 15.9 g/dL Final  . HCT 04/18/2015 37.5  34.8 - 46.6 % Final  . Platelets 04/18/2015 141* 145 - 400 10e3/uL Final  . MCV 04/18/2015 90.3  79.5 - 101.0 fL Final  . MCH 04/18/2015 30.6  25.1 - 34.0 pg Final  . MCHC 04/18/2015 33.9  31.5 - 36.0 g/dL Final  . RBC 04/18/2015 4.15  3.70 - 5.45 10e6/uL Final  . RDW 04/18/2015 17.5* 11.2 - 14.5 % Final  . lymph# 04/18/2015 0.4* 0.9 - 3.3 10e3/uL Final  . MONO# 04/18/2015 0.1  0.1 - 0.9 10e3/uL Final  . Eosinophils Absolute 04/18/2015 0.0  0.0 - 0.5 10e3/uL Final  . Basophils Absolute 04/18/2015 0.1  0.0 - 0.1 10e3/uL Final  . NEUT% 04/18/2015 79.8* 38.4 - 76.8 % Final  . LYMPH% 04/18/2015 12.3* 14.0 - 49.7 % Final  . MONO% 04/18/2015 4.2  0.0 - 14.0 % Final  . EOS% 04/18/2015 1.4  0.0 - 7.0 % Final  . BASO% 04/18/2015 2.3* 0.0 - 2.0 % Final  . Sodium 04/18/2015 139  136 - 145 mEq/L Final  . Potassium 04/18/2015 4.2  3.5 - 5.1 mEq/L Final  . Chloride 04/18/2015 109  98 - 109 mEq/L Final  . CO2 04/18/2015 23  22 - 29 mEq/L Final  . Glucose 04/18/2015 103  70 - 140 mg/dl Final   Glucose  reference range is for nonfasting patients. Fasting glucose reference range is 70- 100.  [place] Kitchen BUN 04/18/2015 26.0  7.0 - 26.0 mg/dL Final  . Creatinine 04/18/2015 1.5* 0.6 - 1.1 mg/dL Final  . Total Bilirubin 04/18/2015 1.76* 0.20 - 1.20 mg/dL Final  . Alkaline Phosphatase 04/18/2015 75  40 - 150 U/L Final  . AST 04/18/2015 15  5 - 34 U/L Final  . ALT 04/18/2015 10  0 - 55 U/L Final  . Total Protein 04/18/2015 5.8* 6.4 - 8.3 g/dL Final  . Albumin 04/18/2015 3.0* 3.5 - 5.0 g/dL Final  . Calcium 04/18/2015 9.2  8.4 - 10.4 mg/dL Final  . Anion Gap 04/18/2015 8  3 - 11 mEq/L Final  . EGFR 04/18/2015 36* >90 ml/min/1.73 m2 Final   eGFR is calculated using the CKD-EPI Creatinine Equation (2009)   Left forearm:      RADIOGRAPHIC STUDIES: No results found.  ASSESSMENT/PLAN:    Warfarin anticoagulation Patient has a history of chronic atrial fib; and continues to take Coumadin as directed.  squamous cell Cancer of lower lobe of right lung Patient presented to the [facility] today to receive cycle 3 of her carboplatin/paclitaxel chemotherapy regimen.  She also continues to receive daily radiation treatments as directed.  Patient reports development of left arm erythema and rash within the past 24 hours.  She states that this is the location of her peripherally infused chemotherapy last week.  It does appear on exam.  The patient has a probable chemotherapy extravasation.  Will treat accordingly.  Otherwise-patient states she's doing fairly well; and denies any recent fevers or chills.  Patient does request to decrease the premedication Benadryl from 50 mg down to 25 mg today due to issues with restless legs.  Blood counts obtained today reveal a WBC of 3.1, ANC 2.5, hemoglobin  12.7, and platelet count 141.  Patient will proceed today with her third cycle of chemotherapy as directed.  After reviewing all findings with Dr. Mohamed-decision was made to schedule patient for Port-A-Cath  placement next week prior to her next cycle of chemotherapy.  Patient is scheduled to return on 04/25/2015 for labs, visit, and chemotherapy.  Chemotherapeutic agent or infusion extravasation Patient presented to the [facility] today to receive cycle 3 of her carboplatin/paclitaxel chemotherapy regimen.  She also continues to receive daily radiation treatments as directed.  Patient reports development of left arm erythema and rash within the past 24 hours.  She states that this is the location of her peripherally infused chemotherapy last week.  It does appear on exam.  The patient has a probable chemotherapy extravasation.  The left forearm and hand with erythema and rash that extends to her hand.  There is no warmth, tenderness, or red streaks.  After reviewing the findings with pharmacy-patient was instructed to apply cool compresses to the site, elevate above the level of for heart is much as possible for treatment of extravasation.  Advised both patient and her husband that the extravasation rash may actually worsen; prior to improvement.  Per extravasation protocol-patient will return to the [facility] tomorrow for a nurse recheck of the site.  She will return on 04/20/2015 for recheck as well.  She will also be scheduled for a recheck of the same site in approximately one week.  After reviewing all findings with Dr. Mohamed-decision was made to schedule patient for Port-A-Cath placement next week prior to her next cycle of chemotherapy.    Patient stated understanding of all instructions; and was in agreement with this plan of care. The patient knows to call the clinic with any problems, questions or concerns.   Review/collaboration with Dr. Julien Nordmann regarding all aspects of patient's visit today.   Total time spent with patient was 25 minutes;  with greater than 75 percent of that time spent in face to face counseling regarding patient's symptoms,  and coordination of care and  follow up.  Disclaimer:This dictation was prepared with Dragon/digital dictation along with [employer]. Any transcriptional errors that result from this process are unintentional.  Drue Second, NP 04/18/2015

## 2015-04-18 NOTE — Assessment & Plan Note (Signed)
Patient presented to the [place] today to receive cycle 3 of her carboplatin/paclitaxel chemotherapy regimen.  She also continues to receive daily radiation treatments as directed.  Patient reports development of left arm erythema and rash within the past 24 hours.  She states that this is the location of her peripherally infused chemotherapy last week.  It does appear on exam.  The patient has a probable chemotherapy extravasation.  Will treat accordingly.  Otherwise-patient states she's doing fairly well; and denies any recent fevers or chills.  Patient does request to decrease the premedication Benadryl from 50 mg down to 25 mg today due to issues with restless legs.  Blood counts obtained today reveal a WBC of 3.1, ANC 2.5, hemoglobin 12.7, and platelet count 141.  Patient will proceed today with her third cycle of chemotherapy as directed.  After reviewing all findings with Dr. Mohamed-decision was made to schedule patient for Port-A-Cath placement next week prior to her next cycle of chemotherapy.  Patient is scheduled to return on 04/25/2015 for labs, visit, and chemotherapy.

## 2015-04-18 NOTE — Assessment & Plan Note (Signed)
Patient presented to the [facility] today to receive cycle 3 of her carboplatin/paclitaxel chemotherapy regimen.  She also continues to receive daily radiation treatments as directed.  Patient reports development of left arm erythema and rash within the past 24 hours.  She states that this is the location of her peripherally infused chemotherapy last week.  It does appear on exam.  The patient has a probable chemotherapy extravasation.  The left forearm and hand with erythema and rash that extends to her hand.  There is no warmth, tenderness, or red streaks.  After reviewing the findings with pharmacy-patient was instructed to apply cool compresses to the site, elevate above the level of for heart is much as possible for treatment of extravasation.  Advised both patient and her husband that the extravasation rash may actually worsen; prior to improvement.  Per extravasation protocol-patient will return to the [facility] tomorrow for a nurse recheck of the site.  She will return on 04/20/2015 for recheck as well.  She will also be scheduled for a recheck of the same site in approximately one week.  After reviewing all findings with Dr. Mohamed-decision was made to schedule patient for Port-A-Cath placement next week prior to her next cycle of chemotherapy.

## 2015-04-18 NOTE — Assessment & Plan Note (Signed)
Patient has a history of chronic atrial fib; and continues to take Coumadin as directed.

## 2015-04-18 NOTE — Patient Instructions (Signed)
[facility] Discharge Instructions for Patients Receiving Chemotherapy  Today you received the following chemotherapy agents taxol/carboplatin  To help prevent nausea and vomiting after your treatment, we encourage you to take your nausea medication as directed   If you develop nausea and vomiting that is not controlled by your nausea medication, call the clinic.   BELOW ARE SYMPTOMS THAT SHOULD BE REPORTED IMMEDIATELY:  *FEVER GREATER THAN 100.5 F  *CHILLS WITH OR WITHOUT FEVER  NAUSEA AND VOMITING THAT IS NOT CONTROLLED WITH YOUR NAUSEA MEDICATION  *UNUSUAL SHORTNESS OF BREATH  *UNUSUAL BRUISING OR BLEEDING  TENDERNESS IN MOUTH AND THROAT WITH OR WITHOUT PRESENCE OF ULCERS  *URINARY PROBLEMS  *BOWEL PROBLEMS  UNUSUAL RASH Items with * indicate a potential emergency and should be followed up as soon as possible.  Feel free to call the clinic you have any questions or concerns. The clinic phone number is [phone].  

## 2015-04-19 ENCOUNTER — Ambulatory Visit: Payer: Medicare Other

## 2015-04-19 ENCOUNTER — Ambulatory Visit
Admission: RE | Admit: 2015-04-19 | Discharge: 2015-04-19 | Disposition: A | Discharge status: Home / Self Care | Payer: Medicare Other | Source: Ambulatory Visit | Attending: Radiation Oncology | Admitting: Radiation Oncology

## 2015-04-19 DIAGNOSIS — Z510 Encounter for antineoplastic radiation therapy: Secondary | ICD-10-CM | POA: Diagnosis not present

## 2015-04-19 NOTE — Progress Notes (Addendum)
As requested per Kristy [name], obtained vitals on Kristy [name] since they were elevated in the infusion area.  Kristy [name] reported that she takes her BP medication in the evening, thus instructed, per Kristy [name], to take her Bystolic as soon as she returns to her home.  Also advised to go to the ED if she experiences any vision changes,headaches, or dizziness, or,generally, does not feel well. At this time she denies having any of the aformentioned symptoms. She and her husband both stated understanding.   Travel by W/C.  BP 163/115 mmHg  Pulse 105

## 2015-04-19 NOTE — Progress Notes (Signed)
IVv site assessment -Left forearm with  Scattered red  rash  from hand up to elbow. Pt states it looks a lot better today " not as red" and denies pain except if " feels like a sunburn". Pt instructed to keep left arm elevated , protect from sunlight. and may use cool compress to area. Afebrile. Pt did not take her BP pill today . " I take it at night" . I instructed her to take it when she gets home. She denies headache, vision problems or any other symptoms. Pt went to radiation oncology and I asked RN to recheck BP there.

## 2015-04-19 NOTE — Progress Notes (Signed)
Cyndee Berniece Salines notified and plan reviewed that  pt will be re instructed by RN in radiation to take her BP med when she gets home and to go to ED if she has any symptoms of dizziness, headache , light headed, vision changes.

## 2015-04-20 ENCOUNTER — Ambulatory Visit: Payer: Medicare Other

## 2015-04-20 ENCOUNTER — Ambulatory Visit
Admission: RE | Admit: 2015-04-20 | Discharge: 2015-04-20 | Disposition: A | Discharge status: Home / Self Care | Payer: Medicare Other | Source: Ambulatory Visit | Attending: Radiation Oncology | Admitting: Radiation Oncology

## 2015-04-20 DIAGNOSIS — Z510 Encounter for antineoplastic radiation therapy: Secondary | ICD-10-CM | POA: Diagnosis not present

## 2015-04-20 NOTE — Progress Notes (Signed)
Pt in for RN assessment of left forearm per taxol extravasation.  Noted area of increased redness at knuckles with mild erythema and nonpitting swelling 1+ over right hand. Area of concern on fore arm reddned with no skin breakdown blisters.  Site is warm to touch and tender.  Pt states " it seems a little worse today ".  Per discussion of recommended interventions - including elevation of arm and cool compresses- with this RN demonstrating how to best elevate with elbow above heart with non bent arm.  Pt stated " oh I am not elevated it that high- " and demonstrated how she has been elevating which was with the arm resting on the elbow with the elbow bent and wrist up.  This RN wrote out instructions with pictures showing how to elevate for best outcome- while pt was here obtained several " bone " pillows and elevated arm more appropriately.  Pt informed to continue cool compresses and use pain medication as well as use of benadryl for comfort.  Pt advised to monitor for skin breakdown or blisters.  Pt verbalized understanding.

## 2015-04-21 ENCOUNTER — Ambulatory Visit
Admission: RE | Admit: 2015-04-21 | Discharge: 2015-04-21 | Disposition: A | Discharge status: Home / Self Care | Payer: Medicare Other | Source: Ambulatory Visit | Attending: Radiation Oncology | Admitting: Radiation Oncology

## 2015-04-21 ENCOUNTER — Telehealth: Payer: Self-pay | Admitting: *Deleted

## 2015-04-21 DIAGNOSIS — Z510 Encounter for antineoplastic radiation therapy: Secondary | ICD-10-CM | POA: Diagnosis not present

## 2015-04-21 NOTE — Telephone Encounter (Signed)
LM for pt to return call- Need to check status of pt port/chest following extravasation. We can see pt today or tomorrow if any concern. Asked for a return call.

## 2015-04-22 ENCOUNTER — Telehealth: Payer: Self-pay | Admitting: Medical Oncology

## 2015-04-22 ENCOUNTER — Encounter: Payer: Self-pay | Admitting: Nurse Practitioner

## 2015-04-22 ENCOUNTER — Ambulatory Visit
Admission: RE | Admit: 2015-04-22 | Discharge: 2015-04-22 | Disposition: A | Discharge status: Home / Self Care | Payer: Medicare Other | Source: Ambulatory Visit | Attending: Radiation Oncology | Admitting: Radiation Oncology

## 2015-04-22 ENCOUNTER — Ambulatory Visit (HOSPITAL_BASED_OUTPATIENT_CLINIC_OR_DEPARTMENT_OTHER): Payer: Medicare Other | Admitting: Nurse Practitioner

## 2015-04-22 ENCOUNTER — Ambulatory Visit: Payer: Medicare Other

## 2015-04-22 ENCOUNTER — Encounter: Payer: Self-pay | Admitting: Radiation Oncology

## 2015-04-22 VITALS — BP 161/93 | HR 79 | Temp 97.5°F | Resp 16 | Wt 192.4 lb

## 2015-04-22 DIAGNOSIS — Z7901 Long term (current) use of anticoagulants: Secondary | ICD-10-CM

## 2015-04-22 DIAGNOSIS — C3431 Malignant neoplasm of lower lobe, right bronchus or lung: Secondary | ICD-10-CM

## 2015-04-22 DIAGNOSIS — T8089XA Other complications following infusion, transfusion and therapeutic injection, initial encounter: Secondary | ICD-10-CM

## 2015-04-22 DIAGNOSIS — T8089XD Other complications following infusion, transfusion and therapeutic injection, subsequent encounter: Secondary | ICD-10-CM

## 2015-04-22 DIAGNOSIS — Z510 Encounter for antineoplastic radiation therapy: Secondary | ICD-10-CM | POA: Diagnosis not present

## 2015-04-22 MED ORDER — SULFAMETHOXAZOLE-TRIMETHOPRIM 800-160 MG PO TABS
1.0000 | ORAL_TABLET | Freq: Two times a day (BID) | ORAL | Status: DC
Start: 2015-04-22 — End: 2015-06-21

## 2015-04-22 MED ORDER — RADIAPLEXRX EX GEL
Freq: Once | CUTANEOUS | Status: AC
  Administered 2015-04-22: 20:00:00 via TOPICAL

## 2015-04-22 NOTE — Assessment & Plan Note (Signed)
Patient continues to take her Coumadin as directed. She is scheduled for port placement this coming week; and has been instructed to hold her Coumadin for a total of 4 days surrounding the procedure.

## 2015-04-22 NOTE — Progress Notes (Addendum)
Weight stable. BP elevated. Afebrile. Reports pain in her left arm. Redness and warmth of left arm noted. Patient given Bactrim to manage changes in left arm. Stressed antibiotics prescribed by Selena Lesser must be picked up asap and started. Stressed if left arm becomes worse over the weekend to present to the ED. Patient verbalized understanding. Reports a dry cough and SOB with exertion. Denies difficulty swallowing or skin changes. Radiaplex given. Patient understands to apply radiaplex each night before bed to chest and back.   Oriented patient to staff and routine of the clinic. Provided patient with radiaplex gel and directed upon use. Educated patient reference potential side effects and management such as fatigue, skin changes, cough, and SOB. Patient verbalized understanding of all reviewed.   BP 161/93 mmHg  Pulse 79  Temp(Src) 97.5 F (36.4 C) (Oral)  Resp 16  Wt 192 lb 6.4 oz (87.272 kg)  SpO2 100% Wt Readings from Last 3 Encounters:  04/22/15 192 lb 6.4 oz (87.272 kg)  03/28/15 190 lb (86.183 kg)  03/24/15 190 lb (86.183 kg)

## 2015-04-22 NOTE — Assessment & Plan Note (Signed)
Patient received cycle 2 of her carboplatin/paclitaxel chemotherapy peripherally-and apparently developed an extravasation to the left forearm and hand.  She has been applying cool compresses and elevating her arm above the level of her heart whenever possible.  She notes some increased redness, warmth, mild edema, and stinging pain to the dorsal left hand within the past 24 hours. Patient denies any recent fevers or chills.  On exam.  It does appear the patient has increased erythema, warmth, and trace edema to the top of her left hand.  Patient may very well be developing some mild cellulitis to her hand. The main portion of patient's left forearm extravasation site does appear to be slowly resolving.  Will prescribe Bactrim antibiotics (pt is PCN allergic) for treatment of mild cellulitis.  Also, patient was advised to go directly to the emergency department over the weekend if she develops any worsening symptoms whatsoever.  Patient is scheduled for port placement this coming Wednesday, 04/27/2015.

## 2015-04-22 NOTE — Telephone Encounter (Signed)
Port scheduled for wed, Can she be off coumadin for 4 days prior/ I told tiffany to contact Th[facility] Dinning re coumadin

## 2015-04-22 NOTE — Progress Notes (Signed)
SYMPTOM MANAGEMENT CLINIC   HPI: Kristy [name] 69 y.o. female diagnosed with lung cancer.  Currently undergoing carboplatin/paclitaxel chemotherapy and radiation treatments.  Patient received cycle 2 of her carboplatin/paclitaxel chemotherapy peripherally-and apparently developed an extravasation to the left forearm and hand.  She has been applying cool compresses and elevating her arm above the level of her heart whenever possible.  She notes some increased redness, warmth, mild edema, and stinging pain to the dorsal left hand within the past 24 hours. Patient denies any recent fevers or chills.  HPI  ROS  Past Medical History  Diagnosis Date  . Atrial fibrillation ([place]) 09/29/2008    Paroxysmal, amiodarone therapy with cardioversion to sinus rhythm in the past. / cardioversion on higher dose amiodarone successful October, 2010 / amiodarone stopped or pulmonary illness November, 2010, patient improved, amiodarone not restarted / regular rhythm January, 2011 / returned atrial fibrillation by March, 2011... plan rate control  . RENAL INSUFFICIENCY 09/29/2008    Creatinine 1.9  . VENTRICULAR HYPERTROPHY, LEFT 09/29/2008  . PULMONARY HYPERTENSION 09/29/2008  . ALLERGIC RHINITIS 09/29/2008  . HYPERCHOLESTEROLEMIA 09/29/2008  . ANXIETY DEPRESSION 09/29/2008  . OBESITY 09/29/2008  . GERD 09/29/2008  . HYPERTENSION 09/29/2008    No echo data as of April, 2011 ( normal LV function by history)  . PEPTIC ULCER DISEASE 09/29/2008  . THYROTOXICOSIS 09/29/2008  . CHEST DISCOMFORT 05/30/2009    In October,2010  ,??GI??  . CAD 10/20/2008    Catheterization [facility] 2001, 70% circumflex / nuclear April, 2010 no ischemia, not gated with atrial fib  . Cough 04/12/2009    Her lisinopril held October, 2010, cough resolved  . Warfarin anticoagulation   . Peptic ulcer disease   . Fluid overload     Improvement with Zaroxolyn July, 2011  . Shortness of breath     Amiodarone will not be restarted  /     Pulmonary  illness November, 2010, abnormal CT, sedimentation rate not elevated, Dr Melvyn Novas saw patient,, no desaturation walking April, 2011, plan to follow  . Chronic diastolic CHF (congestive heart failure) ([place])   . Ejection fraction     Past Surgical History  Procedure Laterality Date  . Tonsillectomy and adenoidectomy  1953  . Dilation and curettage of uterus  2004    x2  . 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    is allergic to tramadol; amiodarone; atorvastatin; bystolic; cholestyramine; clonidine hydrochloride; lansoprazole; oxycodone-acetaminophen; penicillins; prevalite; propoxyphene hcl; propoxyphene n-acetaminophen; and aspirin.    Medication List       This list is accurate as of: 04/22/15  5:07 PM.  Always use your most recent med list.               allopurinol 300 MG tablet  Commonly known as:  ZYLOPRIM  Take 300 mg by mouth every morning.     atenolol 100 MG tablet  Commonly known as:  TENORMIN  Take 100 mg by mouth every morning.     diltiazem 240 MG 24 hr capsule  Commonly known as:  CARDIZEM CD  Take 1 capsule (240 mg total) by  mouth daily.     docusate sodium 100 MG capsule  Commonly known as:  COLACE  Take 100 mg by mouth daily as needed for mild constipation.     furosemide 80 MG tablet  Commonly known as:  LASIX  Take 40 mg by mouth every morning.     HYDROcodone-acetaminophen 5-325 MG tablet  Commonly known as:  NORCO/VICODIN  Take 1 tablet by mouth  every 6 (six) hours as needed for moderate pain.     ibuprofen 200 MG tablet  Commonly known as:  ADVIL,MOTRIN  Take 200 mg by mouth every 6 (six) hours as needed.     latanoprost 0.005 % ophthalmic solution  Commonly known as:  XALATAN  Place 1 drop into both eyes at bedtime.     levothyroxine 150 MCG tablet  Commonly known as:  SYNTHROID, LEVOTHROID  Take 150 mcg by mouth every morning.     lisinopril 40 MG tablet  Commonly known as:  PRINIVIL,ZESTRIL  Take 40 mg by mouth every morning.     LORazepam 0.5 MG tablet  Commonly known as:  ATIVAN  Take 0.5 mg by mouth at bedtime.     minoxidil 2.5 MG tablet  Commonly known as:  LONITEN  Take 1 tablet by mouth every morning.     omeprazole 20 MG capsule  Commonly known as:  PRILOSEC  Take 20 mg by mouth every morning.     prochlorperazine 10 MG tablet  Commonly known as:  COMPAZINE  Take 1 tablet (10 mg total) by mouth every 6 (six) hours as needed for nausea or vomiting.     sulfamethoxazole-trimethoprim 800-160 MG tablet  Commonly known as:  BACTRIM DS,SEPTRA DS  Take 1 tablet by mouth 2 (two) times daily.     traMADol 50 MG tablet  Commonly known as:  ULTRAM  Take 1 tablet (50 mg total) by mouth every 6 (six) hours as needed.     traZODone 50 MG tablet  Commonly known as:  DESYREL  Take 1 tablet by mouth every evening.     warfarin 5 MG tablet  Commonly known as:  COUMADIN  Take 5 mg by mouth at bedtime.         PHYSICAL EXAMINATION  Oncology Vitals 04/22/2015 04/20/2015 04/19/2015 04/19/2015 04/18/2015 04/11/2015 04/04/2015  Height - - - - - - -  Weight - - - - - - -  Weight (lbs) - - - - - - -  BMI (kg/m2) - - - - - - -  Temp 96.9 98.2 - 97.8 97.9 97.4 97.7  Pulse 52 65 105 112 72 98 70  Resp 20 - - _0 SpO2 97 98 - - 99 100 100  BSA (m2) - - - - - - -   BP Readings from Last 3 Encounters:  04/22/15 117/66  04/20/15 143/78  04/19/15 163/115    Physical Exam  Constitutional: She is  oriented to person, place, and time and well-developed, well-nourished, and in no distress.  HENT:  Head: Normocephalic and atraumatic.  Eyes: Conjunctivae and EOM are normal. Pupils are equal, round, and reactive to light. Right eye exhibits no discharge. Left eye exhibits no discharge. No scleral icterus.  Neck: Normal range of motion.  Pulmonary/Chest: Effort normal. No respiratory distress.  Musculoskeletal: Normal range of motion.  Neurological: She is alert and oriented to person, place, and time.  Skin: Skin is warm and dry. Rash noted. There is erythema. No pallor.  On  exam.  It does appear the patient has increased erythema, warmth, and trace edema to the top of her left hand.  Patient may very well be developing some mild cellulitis to her hand. The main portion of patient's left forearm extravasation site does appear to be slowly resolving.    Psychiatric: Affect normal.  Nursing note and vitals reviewed.   LABORATORY DATA:. No visits with results within 3 Day(s) from this visit. Latest known visit with results is:  Appointment on 04/18/2015  Component Date Value Ref Range Status  . WBC 04/18/2015 3.1* 3.9 - 10.3 10e3/uL Final  . NEUT# 04/18/2015 2.5  1.5 - 6.5 10e3/uL Final  . HGB 04/18/2015 12.7  11.6 - 15.9 g/dL Final  . HCT 04/18/2015 37.5  34.8 - 46.6 % Final  . Platelets 04/18/2015 141* 145 - 400 10e3/uL Final  . MCV 04/18/2015 90.3  79.5 - 101.0 fL Final  . MCH 04/18/2015 30.6  25.1 - 34.0 pg Final  . MCHC 04/18/2015 33.9  31.5 - 36.0 g/dL Final  . RBC 04/18/2015 4.15  3.70 - 5.45 10e6/uL Final  . RDW 04/18/2015 17.5* 11.2 - 14.5 % Final  . lymph# 04/18/2015 0.4* 0.9 - 3.3 10e3/uL Final  . MONO# 04/18/2015 0.1  0.1 - 0.9 10e3/uL Final  . Eosinophils Absolute 04/18/2015 0.0  0.0 - 0.5 10e3/uL Final  . Basophils Absolute 04/18/2015 0.1  0.0 - 0.1 10e3/uL Final  . NEUT% 04/18/2015 79.8* 38.4 - 76.8 % Final  . LYMPH% 04/18/2015 12.3* 14.0 - 49.7 % Final  . MONO%  04/18/2015 4.2  0.0 - 14.0 % Final  . EOS% 04/18/2015 1.4  0.0 - 7.0 % Final  . BASO% 04/18/2015 2.3* 0.0 - 2.0 % Final  . Sodium 04/18/2015 139  136 - 145 mEq/L Final  . Potassium 04/18/2015 4.2  3.5 - 5.1 mEq/L Final  . Chloride 04/18/2015 109  98 - 109 mEq/L Final  . CO2 04/18/2015 23  22 - 29 mEq/L Final  . Glucose 04/18/2015 103  70 - 140 mg/dl Final   Glucose reference range is for nonfasting patients. Fasting glucose reference range is 70- 100.  Marland Kitchen BUN 04/18/2015 26.0  7.0 - 26.0 mg/dL Final  . Creatinine 04/18/2015 1.5* 0.6 - 1.1 mg/dL Final  . Total Bilirubin 04/18/2015 1.76* 0.20 - 1.20 mg/dL Final  . Alkaline Phosphatase 04/18/2015 75  40 - 150 U/L Final  . AST 04/18/2015 15  5 - 34 U/L Final  . ALT 04/18/2015 10  0 - 55 U/L Final  . Total Protein 04/18/2015 5.8* 6.4 - 8.3 g/dL Final  . Albumin 04/18/2015 3.0* 3.5 - 5.0 g/dL Final  . Calcium 04/18/2015 9.2  8.4 - 10.4 mg/dL Final  . Anion Gap 04/18/2015 8  3 - 11 mEq/L Final  . EGFR 04/18/2015 36* >90 ml/min/1.73 m2 Final   eGFR is calculated using the CKD-EPI Creatinine Equation (2009)   Left arm/hand:     RADIOGRAPHIC STUDIES: No results found.  ASSESSMENT/PLAN:    Warfarin anticoagulation  Patient continues to take her Coumadin as directed. She is scheduled for port placement this coming week; and has been instructed to hold her Coumadin for a total of 4 days surrounding the procedure.  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   She also continues to receive daily radiation treatments as directed.  Patient is scheduled to return on 04/25/2015 for labs, visit, and chemotherapy.  also, patient is scheduled for port placement on Wednesday, 04/27/2015.    Chemotherapeutic agent or infusion extravasation Patient received cycle 2 of her carboplatin/paclitaxel chemotherapy peripherally-and apparently developed an  extravasation to the left forearm and hand.  She has been applying cool compresses and elevating her arm above the level of her heart whenever possible.  She notes some increased redness, warmth, mild edema, and stinging pain to the dorsal left hand within the past 24 hours. Patient denies any recent fevers or chills.  On exam.  It does appear the patient has increased erythema, warmth, and trace edema to the top of her left hand.  Patient may very well be developing some mild cellulitis to her hand. The main portion of patient's left forearm extravasation site does appear to be slowly resolving.  Will prescribe Bactrim antibiotics (pt is PCN allergic) for treatment of mild cellulitis.  Also, patient was advised to go directly to the emergency department over the weekend if she develops any worsening symptoms whatsoever.  Patient is scheduled for port placement this coming Wednesday, 04/27/2015.         Patient stated understanding of all instructions; and was in agreement with this plan of care. The patient knows to call the clinic with any problems, questions or concerns.   Review/collaboration with Dr. Julien Nordmann regarding all aspects of patient's visit today.   Total time spent with patient was 25 minutes;  with greater than 75 percent of that time spent in face to face counseling regarding patient's symptoms,  and coordination of care and follow up.  Disclaimer:This dictation was prepared with Dragon/digital dictation along with [employer]. Any transcriptional errors that result from this process are unintentional.  Drue Second, NP 04/22/2015

## 2015-04-22 NOTE — Progress Notes (Signed)
Department of Radiation Oncology  Phone:  [phone] Fax:        [phone]  Weekly Treatment Note    Name: Kristy [name] Date: 04/22/2015 MRN: [id number] DOB: 1945/07/14   Current dose: 10 Gy  Current fraction:5   MEDICATIONS: Current Outpatient Prescriptions  Medication Sig Dispense Refill  . allopurinol (ZYLOPRIM) 300 MG tablet Take 300 mg by mouth every morning.     Marland Kitchen atenolol (TENORMIN) 100 MG tablet Take 100 mg by mouth every morning.     . chlorhexidine (PERIDEX) 0.12 % solution Rinse with 1/2 ounce by mouth for 30 seconds then spit out. use twice a day  0  . clotrimazole (MYCELEX) 10 MG troche dissolve 1 troche by mouth five times a day  0  . diltiazem (CARDIZEM CD) 240 MG 24 hr capsule Take 1 capsule (240 mg total) by mouth daily. (Patient taking differently: Take 240 mg by mouth at bedtime. ) 90 capsule 3  . docusate sodium (COLACE) 100 MG capsule Take 100 mg by mouth daily as needed for mild constipation.    . furosemide (LASIX) 80 MG tablet Take 40 mg by mouth every morning.   0  . HYDROcodone-acetaminophen (NORCO/VICODIN) 5-325 MG per tablet Take 1 tablet by mouth every 6 (six) hours as needed for moderate pain.    Marland Kitchen ibuprofen (ADVIL,MOTRIN) 200 MG tablet Take 200 mg by mouth every 6 (six) hours as needed.    . latanoprost (XALATAN) 0.005 % ophthalmic solution Place 1 drop into both eyes at bedtime.    Marland Kitchen levothyroxine (SYNTHROID, LEVOTHROID) 150 MCG tablet Take 150 mcg by mouth every morning.     Marland Kitchen lisinopril (PRINIVIL,ZESTRIL) 40 MG tablet Take 40 mg by mouth every morning.     Marland Kitchen LORazepam (ATIVAN) 0.5 MG tablet Take 0.5 mg by mouth at bedtime.    . minoxidil (LONITEN) 2.5 MG tablet Take 1 tablet by mouth every morning.     Marland Kitchen omeprazole (PRILOSEC) 20 MG capsule Take 20 mg by mouth every morning.     . prochlorperazine (COMPAZINE) 10 MG tablet Take 1 tablet (10 mg total) by mouth every 6 (six) hours as needed for nausea or vomiting. 30 tablet 0  .  sulfamethoxazole-trimethoprim (BACTRIM DS,SEPTRA DS) 800-160 MG tablet Take 1 tablet by mouth 2 (two) times daily. 20 tablet 0  . traMADol (ULTRAM) 50 MG tablet Take 1 tablet (50 mg total) by mouth every 6 (six) hours as needed. 30 tablet 0  . traZODone (DESYREL) 50 MG tablet Take 1 tablet by mouth every evening.    . warfarin (COUMADIN) 5 MG tablet Take 5 mg by mouth at bedtime.     Marland Kitchen warfarin (COUMADIN) 7.5 MG tablet   0   No current facility-administered medications for this encounter.     ALLERGIES: Tramadol; Amiodarone; Atorvastatin; Bystolic; Cholestyramine; Clonidine hydrochloride; Lansoprazole; Oxycodone-acetaminophen; Penicillins; Prevalite; Propoxyphene hcl; Propoxyphene n-acetaminophen; and Aspirin   LABORATORY DATA:  Lab Results  Component Value Date   WBC 3.1* 04/18/2015   HGB 12.7 04/18/2015   HCT 37.5 04/18/2015   MCV 90.3 04/18/2015   PLT 141* 04/18/2015   Lab Results  Component Value Date   NA 139 04/18/2015   K 4.2 04/18/2015   CL 99* 03/09/2015   CO2 23 04/18/2015   Lab Results  Component Value Date   ALT 10 04/18/2015   AST 15 04/18/2015   ALKPHOS 75 04/18/2015   BILITOT 1.76* 04/18/2015     NARRATIVE: Kristy [name] was seen  today for weekly treatment management. The chart was checked and the patient's films were reviewed.  Weight stable. BP elevated. Afebrile. Reports pain in her left arm. Redness and warmth of left arm noted. Patient given Bactrim to manage changes in left arm. Stressed antibiotics prescribed by Selena Lesser must be picked up asap and started. Stressed if left arm becomes worse over the weekend to present to the ED. Patient verbalized understanding. Reports a dry cough and SOB with exertion. Denies difficulty swallowing or skin changes. Radiaplex given. Patient understands to apply radiaplex each night before bed to chest and back.   The patient denies any change in cough, shortness of breath.  BP 161/93 mmHg  Pulse 79  Temp(Src)  97.5 F (36.4 C) (Oral)  Resp 16  Wt 192 lb 6.4 oz (87.272 kg)  SpO2 100% Wt Readings from Last 3 Encounters:  04/22/15 192 lb 6.4 oz (87.272 kg)  03/28/15 190 lb (86.183 kg)  03/24/15 190 lb (86.183 kg)     PHYSICAL EXAMINATION: weight is 192 lb 6.4 oz (87.272 kg). Her oral temperature is 97.5 F (36.4 C). Her blood pressure is 161/93 and her pulse is 79. Her respiration is 16 and oxygen saturation is 100%.        ASSESSMENT: The patient is doing satisfactorily with treatment.  PLAN: We will continue with the patient's radiation treatment as planned.

## 2015-04-22 NOTE — Progress Notes (Signed)
1640-Pt to infusion room for re-assessment of extravasation to left forearm.  Michel Harrow NP to infusion room to assess pt.'s arm.  Picture taken by C. Berniece Salines NP.

## 2015-04-22 NOTE — Assessment & Plan Note (Signed)
Patient received cycle 3 of her carboplatin/paclitaxel chemotherapy regimen on 04/18/15.   She also continues to receive daily radiation treatments as directed.  Patient is scheduled to return on 04/25/2015 for labs, visit, and chemotherapy.   also, patient is scheduled for port placement on Wednesday, 04/27/2015.

## 2015-04-25 ENCOUNTER — Ambulatory Visit (HOSPITAL_BASED_OUTPATIENT_CLINIC_OR_DEPARTMENT_OTHER): Payer: Medicare Other

## 2015-04-25 ENCOUNTER — Other Ambulatory Visit (HOSPITAL_BASED_OUTPATIENT_CLINIC_OR_DEPARTMENT_OTHER): Payer: Medicare Other

## 2015-04-25 ENCOUNTER — Ambulatory Visit (HOSPITAL_BASED_OUTPATIENT_CLINIC_OR_DEPARTMENT_OTHER): Payer: Medicare Other | Admitting: Oncology

## 2015-04-25 ENCOUNTER — Encounter: Payer: Self-pay | Admitting: Oncology

## 2015-04-25 ENCOUNTER — Ambulatory Visit
Admission: RE | Admit: 2015-04-25 | Discharge: 2015-04-25 | Disposition: A | Discharge status: Home / Self Care | Payer: Medicare Other | Source: Ambulatory Visit | Attending: Radiation Oncology | Admitting: Radiation Oncology

## 2015-04-25 VITALS — BP 143/73 | HR 54 | Temp 97.7°F | Resp 18 | Ht 66.0 in | Wt 191.6 lb

## 2015-04-25 DIAGNOSIS — Z5111 Encounter for antineoplastic chemotherapy: Secondary | ICD-10-CM

## 2015-04-25 DIAGNOSIS — Z510 Encounter for antineoplastic radiation therapy: Secondary | ICD-10-CM | POA: Diagnosis not present

## 2015-04-25 DIAGNOSIS — C3431 Malignant neoplasm of lower lobe, right bronchus or lung: Secondary | ICD-10-CM

## 2015-04-25 LAB — COMPREHENSIVE METABOLIC PANEL (CC13)
ALBUMIN: 3.3 g/dL — AB (ref 3.5–5.0)
ALT: 12 U/L (ref 0–55)
AST: 14 U/L (ref 5–34)
Alkaline Phosphatase: 68 U/L (ref 40–150)
Anion Gap: 8 mEq/L (ref 3–11)
BUN: 19.3 mg/dL (ref 7.0–26.0)
CALCIUM: 9.3 mg/dL (ref 8.4–10.4)
CHLORIDE: 107 meq/L (ref 98–109)
CO2: 21 mEq/L — ABNORMAL LOW (ref 22–29)
CREATININE: 1.7 mg/dL — AB (ref 0.6–1.1)
EGFR: 30 mL/min/{1.73_m2} — ABNORMAL LOW (ref >90)
GLUCOSE: 105 mg/dL (ref 70–140)
POTASSIUM: 4.9 meq/L (ref 3.5–5.1)
SODIUM: 136 meq/L (ref 136–145)
Total Bilirubin: 1.29 mg/dL — ABNORMAL HIGH (ref 0.20–1.20)
Total Protein: 5.8 g/dL — ABNORMAL LOW (ref 6.4–8.3)

## 2015-04-25 LAB — CBC WITH DIFFERENTIAL/PLATELET
BASO%: 3.1 % — AB (ref 0.0–2.0)
BASOS ABS: 0.1 10*3/uL (ref 0.0–0.1)
EOS%: 1.4 % (ref 0.0–7.0)
Eosinophils Absolute: 0.1 10*3/uL (ref 0.0–0.5)
HEMATOCRIT: 36.3 % (ref 34.8–46.6)
HEMOGLOBIN: 12.1 g/dL (ref 11.6–15.9)
LYMPH#: 0.2 10*3/uL — AB (ref 0.9–3.3)
LYMPH%: 6.2 % — ABNORMAL LOW (ref 14.0–49.7)
MCH: 31 pg (ref 25.1–34.0)
MCHC: 33.4 g/dL (ref 31.5–36.0)
MCV: 93 fL (ref 79.5–101.0)
MONO#: 0.1 10*3/uL (ref 0.1–0.9)
MONO%: 3.8 % (ref 0.0–14.0)
NEUT#: 3.3 10*3/uL (ref 1.5–6.5)
NEUT%: 85.5 % — ABNORMAL HIGH (ref 38.4–76.8)
Platelets: 157 10*3/uL (ref 145–400)
RBC: 3.91 10*6/uL (ref 3.70–5.45)
RDW: 18.7 % — AB (ref 11.2–14.5)
WBC: 3.8 10*3/uL — AB (ref 3.9–10.3)

## 2015-04-25 LAB — TECHNOLOGIST REVIEW: Technologist Review: 2

## 2015-04-25 MED ORDER — DIPHENHYDRAMINE HCL 50 MG/ML IJ SOLN
INTRAMUSCULAR | Status: AC
Start: 2015-04-25 — End: 2015-04-25
  Filled 2015-04-25: qty 1

## 2015-04-25 MED ORDER — FAMOTIDINE IN NACL 20-0.9 MG/50ML-% IV SOLN
INTRAVENOUS | Status: AC
  Filled 2015-04-25: qty 50

## 2015-04-25 MED ORDER — DIPHENHYDRAMINE HCL 50 MG/ML IJ SOLN
50.0000 mg | Freq: Once | INTRAMUSCULAR | Status: AC
  Administered 2015-04-25: 50 mg via INTRAVENOUS

## 2015-04-25 MED ORDER — PACLITAXEL CHEMO INJECTION 300 MG/50ML
45.0000 mg/m2 | Freq: Once | INTRAVENOUS | Status: AC
  Administered 2015-04-25: 90 mg via INTRAVENOUS
  Filled 2015-04-25: qty 15

## 2015-04-25 MED ORDER — FAMOTIDINE IN NACL 20-0.9 MG/50ML-% IV SOLN
20.0000 mg | Freq: Once | INTRAVENOUS | Status: AC
  Administered 2015-04-25: 20 mg via INTRAVENOUS

## 2015-04-25 MED ORDER — SODIUM CHLORIDE 0.9 % IV SOLN
116.8000 mg | Freq: Once | INTRAVENOUS | Status: AC
  Administered 2015-04-25: 120 mg via INTRAVENOUS
  Filled 2015-04-25: qty 12

## 2015-04-25 MED ORDER — SODIUM CHLORIDE 0.9 % IV SOLN
Freq: Once | INTRAVENOUS | Status: AC
  Administered 2015-04-25: 11:00:00 via INTRAVENOUS

## 2015-04-25 MED ORDER — SODIUM CHLORIDE 0.9 % IV SOLN
Freq: Once | INTRAVENOUS | Status: AC
  Administered 2015-04-25: 12:00:00 via INTRAVENOUS
  Filled 2015-04-25: qty 8

## 2015-04-25 NOTE — Patient Instructions (Signed)
[facility] Discharge Instructions for Patients Receiving Chemotherapy  Today you received the following chemotherapy agents: Taxol, Carboplatin   To help prevent nausea and vomiting after your treatment, we encourage you to take your nausea medication as directed.    If you develop nausea and vomiting that is not controlled by your nausea medication, call the clinic.   BELOW ARE SYMPTOMS THAT SHOULD BE REPORTED IMMEDIATELY:  *FEVER GREATER THAN 100.5 F  *CHILLS WITH OR WITHOUT FEVER  NAUSEA AND VOMITING THAT IS NOT CONTROLLED WITH YOUR NAUSEA MEDICATION  *UNUSUAL SHORTNESS OF BREATH  *UNUSUAL BRUISING OR BLEEDING  TENDERNESS IN MOUTH AND THROAT WITH OR WITHOUT PRESENCE OF ULCERS  *URINARY PROBLEMS  *BOWEL PROBLEMS  UNUSUAL RASH Items with * indicate a potential emergency and should be followed up as soon as possible.  Feel free to call the clinic you have any questions or concerns. The clinic phone number is [phone].  Please show the CHEMO ALERT CARD at check-in to the Emergency Department and triage nurse.   

## 2015-04-25 NOTE — Progress Notes (Signed)
No images are attached to the encounter. No scans are attached to the encounter. No scans are attached to the encounter. 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 Patient diagnosed in September 2016, presented with right lower lobe lung mass in addition to mediastinal lymphadenopathy  PRIOR THERAPY: None  CURRENT THERAPY: Concurrent chemoradiation with weekly carboplatin for AUC of 2 and paclitaxel 45 MG/M2. Started on 04/04/15.  INTERVAL HISTORY: Kristy [name] 69 y.o. female returns for  routine follow-up of her lung cancer. She is currently receiving concurrent chemoradiation therapy and tolerating well overall. The patient is due for cycle 4 carboplatin and Taxol today. She developed an extravasation of her chemotherapy to her left arm last week. She was placed on Bactrim twice a day and is taking approximately 2 days worth of this medication. Patient reports that her left arm is really unchanged. It is not worsening, however, it is more itchy at this time. She is due to have her Port-A-Cath placed this Wednesday, October 19. Patient otherwise tolerated her chemotherapy well. She has mild fatigue. She denies chest pain, shortness of breath, hemoptysis, abdominal pain, nausea, and vomiting. Denies difficulty swallowing. Denies fevers and chills. The patient is here for evaluation prior to cycle 4 of her chemotherapy today.  MEDICAL HISTORY: Past Medical History  Diagnosis Date  . Atrial fibrillation ([place]) 09/29/2008    Paroxysmal, amiodarone therapy with cardioversion to sinus rhythm in the past. / cardioversion on higher dose amiodarone successful October, 2010 / amiodarone stopped or pulmonary illness November, 2010, patient improved, amiodarone not restarted / regular rhythm January, 2011 / returned atrial  fibrillation by March, 2011... plan rate control  . RENAL INSUFFICIENCY 09/29/2008    Creatinine 1.9  . VENTRICULAR HYPERTROPHY, LEFT 09/29/2008  . PULMONARY HYPERTENSION 09/29/2008  . ALLERGIC RHINITIS 09/29/2008  . HYPERCHOLESTEROLEMIA 09/29/2008  . ANXIETY DEPRESSION 09/29/2008  . OBESITY 09/29/2008  . GERD 09/29/2008  . HYPERTENSION 09/29/2008    No echo data as of April, 2011 ( normal LV function by history)  . PEPTIC ULCER DISEASE 09/29/2008  . THYROTOXICOSIS 09/29/2008  . CHEST DISCOMFORT 05/30/2009    In October,2010  ,??GI??  . CAD 10/20/2008    Catheterization [facility] 2001, 70% circumflex / nuclear April, 2010 no ischemia, not gated with atrial fib  . Cough 04/12/2009    Her lisinopril held October, 2010, cough resolved  . Warfarin anticoagulation   . Peptic ulcer disease   . Fluid overload     Improvement with Zaroxolyn July, 2011  . Shortness of breath     Amiodarone will not be restarted  /     Pulmonary illness November, 2010, abnormal CT, sedimentation rate not elevated, Dr Melvyn Novas saw patient,, no desaturation walking April, 2011, plan to follow  . Chronic diastolic CHF (congestive heart failure) ([place])   . Ejection fraction     ALLERGIES:  is allergic to tramadol; amiodarone; atorvastatin; bystolic; cholestyramine; clonidine hydrochloride; lansoprazole; oxycodone-acetaminophen; penicillins; prevalite; propoxyphene hcl; propoxyphene n-acetaminophen; and aspirin.  MEDICATIONS:  Current Outpatient Prescriptions  Medication Sig Dispense Refill  . allopurinol (ZYLOPRIM) 300 MG tablet Take 300 mg by mouth every morning.     Marland Kitchen atenolol (TENORMIN) 100 MG tablet Take 100 mg by mouth every morning.     . chlorhexidine (PERIDEX) 0.12 % solution Rinse with 1/2 ounce by mouth  for 30 seconds then spit out. use twice a day  0  . clotrimazole (MYCELEX) 10 MG troche dissolve 1 troche by mouth five times a day  0  . diltiazem (CARDIZEM CD) 240 MG 24 hr capsule Take 1 capsule (240 mg total) by mouth  daily. (Patient taking differently: Take 240 mg by mouth at bedtime. ) 90 capsule 3  . docusate sodium (COLACE) 100 MG capsule Take 100 mg by mouth daily as needed for mild constipation.    . furosemide (LASIX) 80 MG tablet Take 40 mg by mouth every morning.   0  . HYDROcodone-acetaminophen (NORCO/VICODIN) 5-325 MG per tablet Take 1 tablet by mouth every 6 (six) hours as needed for moderate pain.    Marland Kitchen ibuprofen (ADVIL,MOTRIN) 200 MG tablet Take 200 mg by mouth every 6 (six) hours as needed.    . latanoprost (XALATAN) 0.005 % ophthalmic solution Place 1 drop into both eyes at bedtime.    Marland Kitchen levothyroxine (SYNTHROID, LEVOTHROID) 150 MCG tablet Take 150 mcg by mouth every morning.     Marland Kitchen lisinopril (PRINIVIL,ZESTRIL) 40 MG tablet Take 40 mg by mouth every morning.     Marland Kitchen LORazepam (ATIVAN) 0.5 MG tablet Take 0.5 mg by mouth at bedtime.    . minoxidil (LONITEN) 2.5 MG tablet Take 1 tablet by mouth every morning.     Marland Kitchen omeprazole (PRILOSEC) 20 MG capsule Take 20 mg by mouth every morning.     . prochlorperazine (COMPAZINE) 10 MG tablet Take 1 tablet (10 mg total) by mouth every 6 (six) hours as needed for nausea or vomiting. 30 tablet 0  . sulfamethoxazole-trimethoprim (BACTRIM DS,SEPTRA DS) 800-160 MG tablet Take 1 tablet by mouth 2 (two) times daily. 20 tablet 0  . traMADol (ULTRAM) 50 MG tablet Take 1 tablet (50 mg total) by mouth every 6 (six) hours as needed. 30 tablet 0  . traZODone (DESYREL) 50 MG tablet Take 1 tablet by mouth every evening.    . warfarin (COUMADIN) 5 MG tablet Take 5 mg by mouth at bedtime.     Marland Kitchen warfarin (COUMADIN) 7.5 MG tablet   0  . Wound Cleansers (RADIAPLEX EX) Apply topically.     No current facility-administered medications for this visit.    SURGICAL HISTORY:  Past Surgical History  Procedure Laterality Date  . Tonsillectomy and adenoidectomy  1953  . Dilation and curettage of uterus  2004    x2  . Hernia repair  2003    REVIEW OF SYSTEMS:  Review of Systems   Constitutional: Positive for malaise/fatigue. Negative for fever, chills and weight loss.  HENT: Negative.   Eyes: Negative.   Respiratory: Negative.   Cardiovascular: Negative.   Gastrointestinal: Negative.   Genitourinary: Negative.   Musculoskeletal: Negative.   Skin: Positive for itching.       Redness and itching to the left forearm.  Neurological: Negative.   Endo/Heme/Allergies: Negative.   Psychiatric/Behavioral: Negative.      PHYSICAL EXAMINATION: Physical Exam  Constitutional: She is oriented to person, place, and time and well-developed, well-nourished, and in no distress.  HENT:  Head: Normocephalic and atraumatic.  Mouth/Throat: Oropharynx is clear and moist.  Eyes: Conjunctivae and EOM are normal. Pupils are equal, round, and reactive to light.  Neck: Normal range of motion. Neck supple. No thyromegaly present.  Cardiovascular: Normal rate, regular rhythm and normal heart sounds.   Pulmonary/Chest: Effort normal and breath sounds normal. No respiratory distress. She has no wheezes. She has no rales.  Abdominal: Soft. Bowel sounds are normal. She exhibits no distension and no mass. There is no tenderness.  Musculoskeletal: Normal range of motion. She exhibits no edema.  Neurological: She is alert and oriented to person, place, and time.  Skin: There is erythema.  Erythema to the left forearm.  Psychiatric: Mood, memory, affect and judgment normal.  Vitals reviewed.   ECOG PERFORMANCE STATUS: 1 - Symptomatic but completely ambulatory  Blood pressure 143/73, pulse 54, temperature 97.7 F (36.5 C), temperature source Oral, resp. rate 18, height '5\' 6"'$  (1.676 m), weight 191 lb 9.6 oz (86.909 kg), SpO2 99 %.  LABORATORY DATA: Lab Results  Component Value Date   WBC 3.8* 04/25/2015   HGB 12.1 04/25/2015   HCT 36.3 04/25/2015   MCV 93.0 04/25/2015   PLT 157 04/25/2015      Chemistry      Component Value Date/Time   NA 136 04/25/2015 1006   NA 132*  03/09/2015 0700   K 4.9 04/25/2015 1006   K 4.4 03/09/2015 0700   CL 99* 03/09/2015 0700   CO2 21* 04/25/2015 1006   CO2 24 03/09/2015 0700   BUN 19.3 04/25/2015 1006   BUN 33* 03/09/2015 0700   CREATININE 1.7* 04/25/2015 1006   CREATININE 1.50* 03/09/2015 0700      Component Value Date/Time   CALCIUM 9.3 04/25/2015 1006   CALCIUM 9.6 03/09/2015 0700   ALKPHOS 68 04/25/2015 1006   AST 14 04/25/2015 1006   ALT 12 04/25/2015 1006   BILITOT 1.29* 04/25/2015 1006       RADIOGRAPHIC STUDIES:  Mr Brain Wo Contrast  03/28/2015  CLINICAL DATA:  Newly diagnosed right lower lobe lung cancer. Staging. Difficulty walking. EXAM: MRI HEAD WITHOUT CONTRAST TECHNIQUE: Multiplanar, multiecho pulse sequences of the brain and surrounding structures were obtained without intravenous contrast. COMPARISON:  PET-CT 03/08/2015 FINDINGS: IV contrast could not be administered due to diminished renal function, and this limits sensitivity for lesion detection. There is no evidence of acute infarct, mass, midline shift, or extra-axial fluid collection. Scattered, chronic microhemorrhages are noted in both cerebral hemispheres and both thalami. There is moderate cerebral atrophy. Patchy T2 hyperintensities involving the deep greater than subcortical cerebral white matter bilaterally are nonspecific but compatible with moderate chronic small vessel ischemic disease. A chronic lacunar infarct is noted in the right corona radiata. There is a 9 mm T2 hypointense extra-axial lesion in the right lateral posterior fossa which is calcified when correlating with prior PET-CT. 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 There is also a small focus of likely post ischemic gliosis in the medial right cerebellum.  Orbits are unremarkable. No significant inflammatory disease is seen in the paranasal sinuses or mastoid air cells. Major intracranial vascular flow voids are preserved. IMPRESSION: 1. No definite evidence of intracranial metastases, however please note that sensitivity for detection of small lesions is reduced by the lack of IV contrast. 2. 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 3. 9 mm extra-axial lesion in the right posterior fossa, most consistent with a meningioma. 4. Moderate chronic small vessel ischemic disease in the cerebral white matter. Electronically Signed   By: Logan Bores M.D.   On: 03/28/2015 16:11  ASSESSMENT/PLAN:  No problem-specific assessment & plan notes found for this encounter.  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 She is currently undergoing a course of concurrent chemoradiation therapy with chemotherapy consisting of weekly carboplatin for an AUC of 2 and paclitaxel 45 mg meter squared. She is status post three cycles of this chemotherapy.  The patient was seen today with Dr. Julien Nordmann. Recommend that she proceed with cycle 4 chemotherapy today as scheduled. For the left arm extravasation, she will continue the antibiotic that was prescribed to her. She is scheduled to have a Port-A-Cath placed later this week for IV access.  The patient will be seen back in 2 weeks for symptom management visit. She will have her chemotherapy as scheduled next week.  All questions were answered. The patient knows to call the clinic with any problems, questions or concerns. We can certainly see the patient much sooner if necessary.   Mikey Bussing, DNP, AGPCNP-BC, AOCNP 04/25/2015  ADDENDUM: Hematology/Oncology Attending: I had a face to face encounter with the  patient today. I recommended her care plan. This is a very pleasant 68 years old white female diagnosed with a stage IIIa non-small cell lung cancer, squamous cell carcinoma. She is currently undergoing a course of concurrent chemoradiation with weekly carboplatin and paclitaxel status post 3 cycles and tolerating her treatment fairly well except for erythema of the left forearm secondary to chemotherapy extravasation. This was treated with a course of antibiotics and mildly improving. We will schedule the patient for Port-A-Cath placement later this week. Proceed with cycle #4 today as a scheduled. The patient would come back for follow-up visit in 2 weeks for reevaluation and management of any adverse effects of her treatment. She was advised to call immediately if she has any concerning symptoms in the interval.  Disclaimer: This note was dictated with voice recognition software. Similar sounding words can inadvertently be transcribed and may be missed upon review. Eilleen Kempf., MD 04/25/2015

## 2015-04-25 NOTE — Progress Notes (Signed)
Reviewed labs. OK to treat with Creatinine of 1.7 per Mikey Bussing, NP

## 2015-04-26 ENCOUNTER — Telehealth: Payer: Self-pay | Admitting: Oncology

## 2015-04-26 ENCOUNTER — Ambulatory Visit
Admission: RE | Admit: 2015-04-26 | Discharge: 2015-04-26 | Disposition: A | Discharge status: Home / Self Care | Payer: Medicare Other | Source: Ambulatory Visit | Attending: Radiation Oncology | Admitting: Radiation Oncology

## 2015-04-26 ENCOUNTER — Other Ambulatory Visit: Payer: Self-pay | Admitting: Radiology

## 2015-04-26 DIAGNOSIS — Z510 Encounter for antineoplastic radiation therapy: Secondary | ICD-10-CM | POA: Diagnosis not present

## 2015-04-26 NOTE — Telephone Encounter (Signed)
Staff message to dr Julien Nordmann to advise on  10/31 appointment as he is on call and booked

## 2015-04-27 ENCOUNTER — Other Ambulatory Visit: Payer: Self-pay | Admitting: Internal Medicine

## 2015-04-27 ENCOUNTER — Encounter (HOSPITAL_COMMUNITY): Payer: Self-pay

## 2015-04-27 ENCOUNTER — Ambulatory Visit (HOSPITAL_COMMUNITY)
Admission: RE | Admit: 2015-04-27 | Discharge: 2015-04-27 | Disposition: A | Discharge status: Home / Self Care | Payer: Medicare Other | Source: Ambulatory Visit | Attending: Internal Medicine | Admitting: Internal Medicine

## 2015-04-27 ENCOUNTER — Ambulatory Visit
Admission: RE | Admit: 2015-04-27 | Discharge: 2015-04-27 | Disposition: A | Discharge status: Home / Self Care | Payer: Medicare Other | Source: Ambulatory Visit | Attending: Radiation Oncology | Admitting: Radiation Oncology

## 2015-04-27 DIAGNOSIS — I480 Paroxysmal atrial fibrillation: Secondary | ICD-10-CM | POA: Insufficient documentation

## 2015-04-27 DIAGNOSIS — C3431 Malignant neoplasm of lower lobe, right bronchus or lung: Secondary | ICD-10-CM

## 2015-04-27 DIAGNOSIS — Z880 Allergy status to penicillin: Secondary | ICD-10-CM | POA: Diagnosis not present

## 2015-04-27 DIAGNOSIS — F418 Other specified anxiety disorders: Secondary | ICD-10-CM | POA: Insufficient documentation

## 2015-04-27 DIAGNOSIS — I5032 Chronic diastolic (congestive) heart failure: Secondary | ICD-10-CM | POA: Insufficient documentation

## 2015-04-27 DIAGNOSIS — E669 Obesity, unspecified: Secondary | ICD-10-CM | POA: Diagnosis not present

## 2015-04-27 DIAGNOSIS — E7800 Pure hypercholesterolemia, unspecified: Secondary | ICD-10-CM | POA: Diagnosis not present

## 2015-04-27 DIAGNOSIS — Z7901 Long term (current) use of anticoagulants: Secondary | ICD-10-CM | POA: Diagnosis not present

## 2015-04-27 DIAGNOSIS — C3490 Malignant neoplasm of unspecified part of unspecified bronchus or lung: Secondary | ICD-10-CM | POA: Insufficient documentation

## 2015-04-27 DIAGNOSIS — K219 Gastro-esophageal reflux disease without esophagitis: Secondary | ICD-10-CM | POA: Diagnosis not present

## 2015-04-27 DIAGNOSIS — N289 Disorder of kidney and ureter, unspecified: Secondary | ICD-10-CM | POA: Diagnosis not present

## 2015-04-27 DIAGNOSIS — I272 Other secondary pulmonary hypertension: Secondary | ICD-10-CM | POA: Insufficient documentation

## 2015-04-27 DIAGNOSIS — I130 Hypertensive heart and chronic kidney disease with heart failure and stage 1 through stage 4 chronic kidney disease, or unspecified chronic kidney disease: Secondary | ICD-10-CM | POA: Insufficient documentation

## 2015-04-27 DIAGNOSIS — Z510 Encounter for antineoplastic radiation therapy: Secondary | ICD-10-CM | POA: Diagnosis not present

## 2015-04-27 HISTORY — DX: Malignant (primary) neoplasm, unspecified: C80.1

## 2015-04-27 LAB — CBC WITH DIFFERENTIAL/PLATELET
BASOS ABS: 0 10*3/uL (ref 0.0–0.1)
BASOS PCT: 0 %
EOS ABS: 0 10*3/uL (ref 0.0–0.7)
EOS PCT: 0 %
HEMATOCRIT: 31 % — AB (ref 36.0–46.0)
HEMOGLOBIN: 10.6 g/dL — AB (ref 12.0–15.0)
Lymphocytes Relative: 2 %
Lymphs Abs: 0.1 10*3/uL — ABNORMAL LOW (ref 0.7–4.0)
MCH: 31.4 pg (ref 26.0–34.0)
MCHC: 34.2 g/dL (ref 30.0–36.0)
MCV: 91.7 fL (ref 78.0–100.0)
Monocytes Absolute: 0.1 10*3/uL (ref 0.1–1.0)
Monocytes Relative: 3 %
NEUTROS PCT: 95 %
Neutro Abs: 5.1 10*3/uL (ref 1.7–7.7)
Platelets: 157 10*3/uL (ref 150–400)
RBC: 3.38 MIL/uL — AB (ref 3.87–5.11)
RDW: 17.6 % — ABNORMAL HIGH (ref 11.5–15.5)
WBC: 5.4 10*3/uL (ref 4.0–10.5)

## 2015-04-27 LAB — PROTIME-INR
INR: 1.1 (ref 0.00–1.49)
PROTHROMBIN TIME: 14.4 s (ref 11.6–15.2)

## 2015-04-27 LAB — APTT: APTT: 27 s (ref 24–37)

## 2015-04-27 MED ORDER — HEPARIN SOD (PORK) LOCK FLUSH 100 UNIT/ML IV SOLN
INTRAVENOUS | Status: AC
  Filled 2015-04-27: qty 5

## 2015-04-27 MED ORDER — FENTANYL CITRATE (PF) 100 MCG/2ML IJ SOLN
INTRAMUSCULAR | Status: AC
  Filled 2015-04-27: qty 4

## 2015-04-27 MED ORDER — LIDOCAINE-EPINEPHRINE 2 %-1:100000 IJ SOLN
INTRAMUSCULAR | Status: AC
  Filled 2015-04-27: qty 1

## 2015-04-27 MED ORDER — FENTANYL CITRATE (PF) 100 MCG/2ML IJ SOLN
INTRAMUSCULAR | Status: AC | PRN
  Administered 2015-04-27: 50 ug via INTRAVENOUS

## 2015-04-27 MED ORDER — SODIUM CHLORIDE 0.9 % IV SOLN
INTRAVENOUS | Status: DC
  Administered 2015-04-27: 14:00:00 via INTRAVENOUS

## 2015-04-27 MED ORDER — MIDAZOLAM HCL 2 MG/2ML IJ SOLN
INTRAMUSCULAR | Status: AC | PRN
  Administered 2015-04-27: 0.5 mg via INTRAVENOUS
  Administered 2015-04-27: 1 mg via INTRAVENOUS

## 2015-04-27 MED ORDER — MIDAZOLAM HCL 2 MG/2ML IJ SOLN
INTRAMUSCULAR | Status: AC
Start: 2015-04-27 — End: 2015-04-27
  Filled 2015-04-27: qty 6

## 2015-04-27 MED ORDER — VANCOMYCIN HCL IN DEXTROSE 1-5 GM/200ML-% IV SOLN
1000.0000 mg | Freq: Once | INTRAVENOUS | Status: AC
  Administered 2015-04-27: 1000 mg via INTRAVENOUS
  Filled 2015-04-27: qty 200

## 2015-04-27 NOTE — Progress Notes (Signed)
Patient ID: Kristy [name], female   DOB: [birth year], 69 y.o.   MRN: [id number]    Referring Physician(s): Mohamed,Mohamed  Chief Complaint:  Lung cancer  Subjective: Patient familiar to IR service from prior CT guided biopsy of right lower lobe lung mass on 03/09/15. She has known history of stage IIIa non-small cell lung cancer, squamous cell carcinoma which presented with the above lung mass and mediastinal lymphadenopathy. She is currently undergoing chemoradiation and developed an extravasation to the left upper extremity post chemotherapy infusion last week. She was given Bactrim to treat this problem. She presents today for Port-A-Cath placement for additional planned chemotherapy. She currently denies fevers, chills, headaches, chest pain, cough, abdominal or back pain, vomiting or abnormal bleeding. She does have occasional dyspnea with exertion and left forearm discomfort/itching/paresthesias secondary to recent chemotherapy extravasation. Past Medical History  Diagnosis Date  . Atrial fibrillation ([place]) 09/29/2008    Paroxysmal, amiodarone therapy with cardioversion to sinus rhythm in the past. / cardioversion on higher dose amiodarone successful October, 2010 / amiodarone stopped or pulmonary illness November, 2010, patient improved, amiodarone not restarted / regular rhythm January, 2011 / returned atrial fibrillation by March, 2011... plan rate control  . RENAL INSUFFICIENCY 09/29/2008    Creatinine 1.9  . VENTRICULAR HYPERTROPHY, LEFT 09/29/2008  . PULMONARY HYPERTENSION 09/29/2008  . ALLERGIC RHINITIS 09/29/2008  . HYPERCHOLESTEROLEMIA 09/29/2008  . ANXIETY DEPRESSION 09/29/2008  . OBESITY 09/29/2008  . GERD 09/29/2008  . HYPERTENSION 09/29/2008    No echo data as of April, 2011 ( normal LV function by history)  . PEPTIC ULCER DISEASE 09/29/2008  . THYROTOXICOSIS 09/29/2008  . CHEST DISCOMFORT 05/30/2009    In October,2010  ,??GI??  . CAD 10/20/2008    Catheterization [facility] 2001, 70%  circumflex / nuclear April, 2010 no ischemia, not gated with atrial fib  . Cough 04/12/2009    Her lisinopril held October, 2010, cough resolved  . Warfarin anticoagulation   . Peptic ulcer disease   . Fluid overload     Improvement with Zaroxolyn July, 2011  . Shortness of breath     Amiodarone will not be restarted  /     Pulmonary illness November, 2010, abnormal CT, sedimentation rate not elevated, Dr Melvyn Novas saw patient,, no desaturation walking April, 2011, plan to follow  . Chronic diastolic CHF (congestive heart failure) ([place])   . Ejection fraction   . Cancer [facility])     Lung Cancer   Past Surgical History  Procedure Laterality Date  . Tonsillectomy and adenoidectomy  1953  . Dilation and curettage of uterus  2004    x2  . Hernia repair  2003       Allergies: Penicillins; Tramadol; Amiodarone; Atorvastatin; Bystolic; Cholestyramine; Clonidine hydrochloride; Lansoprazole; Oxycodone-acetaminophen; Prevalite; Propoxyphene hcl; Propoxyphene n-acetaminophen; and Aspirin  Medications: Prior to Admission medications   Medication Sig Start Date End Date Taking? Authorizing Provider  allopurinol (ZYLOPRIM) 300 MG tablet Take 300 mg by mouth every morning.     Historical Provider, MD  atenolol (TENORMIN) 100 MG tablet Take 100 mg by mouth every morning.     Historical Provider, MD  chlorhexidine (PERIDEX) 0.12 % solution Rinse with 1/2 ounce by mouth for 30 seconds then spit out. use twice a day 03/30/15   Historical Provider, MD  diltiazem (CARDIZEM CD) 240 MG 24 hr capsule Take 1 capsule (240 mg total) by mouth daily. Patient taking differently: Take 240 mg by mouth at bedtime.  12/08/12   Carlena Bjornstad, MD  docusate sodium (COLACE) 100 MG capsule Take 100 mg by mouth daily as needed for mild constipation.    Historical Provider, MD  furosemide (LASIX) 80 MG tablet Take 40 mg by mouth every morning.  02/28/15   Historical Provider, MD  HYDROcodone-acetaminophen (NORCO/VICODIN) 5-325 MG  per tablet Take 1 tablet by mouth every 6 (six) hours as needed for moderate pain.    Historical Provider, MD  ibuprofen (ADVIL,MOTRIN) 200 MG tablet Take 200 mg by mouth every 6 (six) hours as needed.    Historical Provider, MD  latanoprost (XALATAN) 0.005 % ophthalmic solution Place 1 drop into both eyes at bedtime.    Historical Provider, MD  levothyroxine (SYNTHROID, LEVOTHROID) 150 MCG tablet Take 150 mcg by mouth every morning.     Historical Provider, MD  lisinopril (PRINIVIL,ZESTRIL) 40 MG tablet Take 40 mg by mouth every morning.     Historical Provider, MD  LORazepam (ATIVAN) 0.5 MG tablet Take 0.5 mg by mouth at bedtime.    Historical Provider, MD  minoxidil (LONITEN) 2.5 MG tablet Take 1 tablet by mouth every morning.  11/29/12   Historical Provider, MD  omeprazole (PRILOSEC) 20 MG capsule Take 20 mg by mouth every morning.     Historical Provider, MD  prochlorperazine (COMPAZINE) 10 MG tablet Take 1 tablet (10 mg total) by mouth every 6 (six) hours as needed for nausea or vomiting. 03/24/15   Curt Bears, MD  sulfamethoxazole-trimethoprim (BACTRIM DS,SEPTRA DS) 800-160 MG tablet Take 1 tablet by mouth 2 (two) times daily. 04/22/15   Susanne Borders, NP  traMADol (ULTRAM) 50 MG tablet Take 1 tablet (50 mg total) by mouth every 6 (six) hours as needed. 03/24/15   Curt Bears, MD  traZODone (DESYREL) 50 MG tablet Take 1 tablet by mouth every evening. 12/11/12   Historical Provider, MD  warfarin (COUMADIN) 5 MG tablet Take 5 mg by mouth at bedtime.     Historical Provider, MD  warfarin (COUMADIN) 7.5 MG tablet  04/03/15   Historical Provider, MD  Wound Cleansers (RADIAPLEX EX) Apply topically.    Historical Provider, MD     Vital Signs: BP 110/52 mmHg  Pulse 63  Temp(Src) 98.1 F (36.7 C) (Oral)  Resp 20  SpO2 93%  Physical Exam patient awake, alert. Chest clear to auscultation bilaterally anteriorly. Heart with irregularly irregular rhythm. Abdomen obese, soft, positive bowel  sounds, nontender; trace pretibial LE edema bilaterally; left forearm with erythema, ecchymosis, desquamation from recent chemotherapy extravasation.  Imaging: No results found.  Labs:  CBC:  Recent Labs  04/04/15 1255 04/11/15 1017 04/18/15 1245 04/25/15 1006  WBC 8.5 6.2 3.1* 3.8*  HGB 13.6 14.1 12.7 12.1  HCT 40.4 41.1 37.5 36.3  PLT 218 174 141* 157    COAGS:  Recent Labs  03/09/15 0700  INR 1.09  APTT 30    BMP:  Recent Labs  03/09/15 0700  04/04/15 1255 04/11/15 1018 04/18/15 1245 04/25/15 1006  NA 132*  < > 139 139 139 136  K 4.4  < > 4.3 4.4 4.2 4.9  CL 99*  --   --   --   --   --   CO2 24  < > '27 23 23 '$ 21*  GLUCOSE 129*  < > 107 131 103 105  BUN 33*  < > 30.8* 34.4* 26.0 19.3  CALCIUM 9.6  < > 9.6 9.3 9.2 9.3  CREATININE 1.50*  < > 2.0* 1.6* 1.5* 1.7*  GFRNONAA 34*  --   --   --   --   --  GFRAA 39*  --   --   --   --   --   < > = values in this interval not displayed.  LIVER FUNCTION TESTS:  Recent Labs  04/04/15 1255 04/11/15 1018 04/18/15 1245 04/25/15 1006  BILITOT 1.66* 1.67* 1.76* 1.29*  AST '19 16 15 14  '$ ALT '13 12 10 12  '$ ALKPHOS 106 84 75 68  PROT 6.3* 6.3* 5.8* 5.8*  ALBUMIN 3.2* 3.2* 3.0* 3.3*    Assessment and Plan: Patient with history of recently diagnosed stage IIIa non-small cell lung cancer; also with recent history of chemotherapy extravasation to left forearm region, currently on Bactrim. Plan today is for Port-A-Cath placement for additional planned chemotherapy. Patient has been off Coumadin (afib) in anticipation of the above procedure. Labs pending Risks and benefits discussed with the patient/husband including, but not limited to bleeding, infection, pneumothorax, or fibrin sheath development and need for additional procedures.All of the patient's questions were answered, patient is agreeable to proceed.Consent signed and in chart.     Signed: D. Rowe Robert 04/27/2015, 1:27 PM   I spent a total of 15  minutes at the the patient's bedside AND on the patient's hospital floor or unit, greater than 50% of which was counseling/coordinating care for port a cath placement

## 2015-04-27 NOTE — Discharge Instructions (Signed)
Implanted Port Insertion, Care After °Refer to this sheet in the next few weeks. These instructions provide you with information on caring for yourself after your procedure. Your health care provider may also give you more specific instructions. Your treatment has been planned according to current medical practices, but problems sometimes occur. Call your health care provider if you have any problems or questions after your procedure. °WHAT TO EXPECT AFTER THE PROCEDURE °After your procedure, it is typical to have the following:  °· Discomfort at the port insertion site. Ice packs to the area will help. °· Bruising on the skin over the port. This will subside in 3-4 days. °HOME CARE INSTRUCTIONS °· After your port is placed, you will get a manufacturer's information card. The card has information about your port. Keep this card with you at all times.   °· Know what kind of port you have. There are many types of ports available.   °· Wear a medical alert bracelet in case of an emergency. This can help alert health care workers that you have a port.   °· The port can stay in for as long as your health care provider believes it is necessary.   °· A home health care nurse may give medicines and take care of the port.   °· You or a family member can get special training and directions for giving medicine and taking care of the port at home.   °SEEK MEDICAL CARE IF:  °· Your port does not flush or you are unable to get a blood return.   °· You have a fever or chills. °SEEK IMMEDIATE MEDICAL CARE IF: °· You have new fluid or pus coming from your incision.   °· You notice a bad smell coming from your incision site.   °· You have swelling, pain, or more redness at the incision or port site.   °· You have chest pain or shortness of breath. °  °This information is not intended to replace advice given to you by your health care provider. Make sure you discuss any questions you have with your health care provider. °  °Document  Released: 04/15/2013 Document Revised: 06/30/2013 Document Reviewed: 04/15/2013 °[employer]vier Interactive Patient Education ©2016 [employer] °Implanted Port Home Guide °An implanted port is a type of central line that is placed under the skin. Central lines are used to provide IV access when treatment or nutrition needs to be given through a person's veins. Implanted ports are used for long-term IV access. An implanted port may be placed because:  °· You need IV medicine that would be irritating to the small veins in your hands or arms.   °· You need long-term IV medicines, such as antibiotics.   °· You need IV nutrition for a long period.   °· You need frequent blood draws for lab tests.   °· You need dialysis.   °Implanted ports are usually placed in the chest area, but they can also be placed in the upper arm, the abdomen, or the leg. An implanted port has two main parts:  °· Reservoir. The reservoir is round and will appear as a small, raised area under your skin. The reservoir is the part where a needle is inserted to give medicines or draw blood.   °· Catheter. The catheter is a thin, flexible tube that extends from the reservoir. The catheter is placed into a large vein. Medicine that is inserted into the reservoir goes into the catheter and then into the vein.   °HOW WILL I CARE FOR MY INCISION SITE? °Do not get the   incision site wet. Bathe or shower as directed by your health care provider.  °HOW IS MY PORT ACCESSED? °Special steps must be taken to access the port:  °· Before the port is accessed, a numbing cream can be placed on the skin. This helps numb the skin over the port site.   °· Your health care provider uses a sterile technique to access the port. °· Your health care provider must put on a mask and sterile gloves. °· The skin over your port is cleaned carefully with an antiseptic and allowed to dry. °· The port is gently pinched between sterile gloves, and a needle is inserted into the  port. °· Only "non-coring" port needles should be used to access the port. Once the port is accessed, a blood return should be checked. This helps ensure that the port is in the vein and is not clogged.   °· If your port needs to remain accessed for a constant infusion, a clear (transparent) bandage will be placed over the needle site. The bandage and needle will need to be changed every week, or as directed by your health care provider.   °· Keep the bandage covering the needle clean and dry. Do not get it wet. Follow your health care provider's instructions on how to take a shower or bath while the port is accessed.   °· If your port does not need to stay accessed, no bandage is needed over the port.   °WHAT IS FLUSHING? °Flushing helps keep the port from getting clogged. Follow your health care provider's instructions on how and when to flush the port. Ports are usually flushed with saline solution or a medicine called heparin. The need for flushing will depend on how the port is used.  °· If the port is used for intermittent medicines or blood draws, the port will need to be flushed:   °· After medicines have been given.   °· After blood has been drawn.   °· As part of routine maintenance.   °· If a constant infusion is running, the port may not need to be flushed.   °HOW LONG WILL MY PORT STAY IMPLANTED? °The port can stay in for as long as your health care provider thinks it is needed. When it is time for the port to come out, surgery will be done to remove it. The procedure is similar to the one performed when the port was put in.  °WHEN SHOULD I SEEK IMMEDIATE MEDICAL CARE? °When you have an implanted port, you should seek immediate medical care if:  °· You notice a bad smell coming from the incision site.   °· You have swelling, redness, or drainage at the incision site.   °· You have more swelling or pain at the port site or the surrounding area.   °· You have a fever that is not controlled with  medicine. °  °This information is not intended to replace advice given to you by your health care provider. Make sure you discuss any questions you have with your health care provider. °  °Document Released: 06/25/2005 Document Revised: 04/15/2013 Document Reviewed: 03/02/2013 °[employer] Interactive Patient Education ©2016 [employer] ° ° °Moderate Conscious Sedation, Adult, Care After °Refer to this sheet in the next few weeks. These instructions provide you with information on caring for yourself after your procedure. Your health care provider may also give you more specific instructions. Your treatment has been planned according to current medical practices, but problems sometimes occur. Call your health care provider if you have any problems or questions   after your procedure. °WHAT TO EXPECT AFTER THE PROCEDURE  °After your procedure: °· You may feel sleepy, clumsy, and have poor balance for several hours. °· Vomiting may occur if you eat too soon after the procedure. °HOME CARE INSTRUCTIONS °· Do not participate in any activities where you could become injured for at least 24 hours. Do not: °¨ Drive. °¨ Swim. °¨ Ride a bicycle. °¨ Operate heavy machinery. °¨ Cook. °¨ Use power tools. °¨ Climb ladders. °¨ Work from a high place. °· Do not make important decisions or sign legal documents until you are improved. °· If you vomit, drink water, juice, or soup when you can drink without vomiting. Make sure you have little or no nausea before eating solid foods. °· Only take over-the-counter or prescription medicines for pain, discomfort, or fever as directed by your health care provider. °· Make sure you and your family fully understand everything about the medicines given to you, including what side effects may occur. °· You should not drink alcohol, take sleeping pills, or take medicines that cause drowsiness for at least 24 hours. °· If you smoke, do not smoke without supervision. °· If you are feeling better, you  may resume normal activities 24 hours after you were sedated. °· Keep all appointments with your health care provider. °SEEK MEDICAL CARE IF: °· Your skin is pale or bluish in color. °· You continue to feel nauseous or vomit. °· Your pain is getting worse and is not helped by medicine. °· You have bleeding or swelling. °· You are still sleepy or feeling clumsy after 24 hours. °SEEK IMMEDIATE MEDICAL CARE IF: °· You develop a rash. °· You have difficulty breathing. °· You develop any type of allergic problem. °· You have a fever. °MAKE SURE YOU: °· Understand these instructions. °· Will watch your condition. °· Will get help right away if you are not doing well or get worse. °  °This information is not intended to replace advice given to you by your health care provider. Make sure you discuss any questions you have with your health care provider. °  °Document Released: 04/15/2013 Document Revised: 07/16/2014 Document Reviewed: 04/15/2013 °[employer] Interactive Patient Education ©2016 [employer] ° °

## 2015-04-27 NOTE — Procedures (Signed)
Successful placement of right IJ approach port-a-cath with tip at the superior caval atrial junction. The catheter is ready for immediate use. No immediate post procedural complications.  Ronny Bacon, MD Pager #: [phone]

## 2015-04-28 ENCOUNTER — Ambulatory Visit (HOSPITAL_BASED_OUTPATIENT_CLINIC_OR_DEPARTMENT_OTHER): Payer: Medicare Other | Admitting: Nurse Practitioner

## 2015-04-28 ENCOUNTER — Telehealth: Payer: Self-pay | Admitting: Internal Medicine

## 2015-04-28 ENCOUNTER — Ambulatory Visit
Admission: RE | Admit: 2015-04-28 | Discharge: 2015-04-28 | Disposition: A | Discharge status: Home / Self Care | Payer: Medicare Other | Source: Ambulatory Visit | Attending: Radiation Oncology | Admitting: Radiation Oncology

## 2015-04-28 DIAGNOSIS — C3431 Malignant neoplasm of lower lobe, right bronchus or lung: Secondary | ICD-10-CM

## 2015-04-28 DIAGNOSIS — Z510 Encounter for antineoplastic radiation therapy: Secondary | ICD-10-CM | POA: Diagnosis not present

## 2015-04-28 DIAGNOSIS — T8089XD Other complications following infusion, transfusion and therapeutic injection, subsequent encounter: Secondary | ICD-10-CM | POA: Diagnosis present

## 2015-04-28 NOTE — Telephone Encounter (Signed)
Called and left a message with 10/31 appointment

## 2015-04-29 ENCOUNTER — Encounter: Payer: Self-pay | Admitting: Nurse Practitioner

## 2015-04-29 ENCOUNTER — Encounter: Payer: Self-pay | Admitting: Radiation Oncology

## 2015-04-29 ENCOUNTER — Ambulatory Visit
Admission: RE | Admit: 2015-04-29 | Discharge: 2015-04-29 | Disposition: A | Discharge status: Home / Self Care | Payer: Medicare Other | Source: Ambulatory Visit | Attending: Radiation Oncology | Admitting: Radiation Oncology

## 2015-04-29 VITALS — BP 146/83 | HR 74 | Resp 16 | Wt 198.5 lb

## 2015-04-29 DIAGNOSIS — C3431 Malignant neoplasm of lower lobe, right bronchus or lung: Secondary | ICD-10-CM

## 2015-04-29 DIAGNOSIS — Z510 Encounter for antineoplastic radiation therapy: Secondary | ICD-10-CM | POA: Diagnosis not present

## 2015-04-29 MED ORDER — SUCRALFATE 1 G PO TABS: 1.0000 g | ORAL_TABLET | Freq: Four times a day (QID) | ORAL | Status: AC

## 2015-04-29 NOTE — Progress Notes (Signed)
SYMPTOM MANAGEMENT CLINIC   HPI: Kristy [name] 69 y.o. female diagnosed with lung cancer.  Currently undergoing carboplatin/paclitaxel chemotherapy and radiation treatments.  Patient received cycle 2 of her carboplatin/paclitaxel chemotherapy peripherally-and apparently developed an extravasation to the left forearm and hand.  She has been applying cool compresses and elevating her arm above the level of her heart whenever possible.  She continues to have some increased redness and stinging pain to the dorsal left hand; but has no edema, no warmth and no red streaks to the site.  The area has now started to scab and slough off.  Patient states that she is almost done with her antibiotics.  Patient denies any recent fevers or chills.  HPI  ROS  Past Medical History  Diagnosis Date  . Atrial fibrillation ([place]) 09/29/2008    Paroxysmal, amiodarone therapy with cardioversion to sinus rhythm in the past. / cardioversion on higher dose amiodarone successful October, 2010 / amiodarone stopped or pulmonary illness November, 2010, patient improved, amiodarone not restarted / regular rhythm January, 2011 / returned atrial fibrillation by March, 2011... plan rate control  . RENAL INSUFFICIENCY 09/29/2008    Creatinine 1.9  . VENTRICULAR HYPERTROPHY, LEFT 09/29/2008  . PULMONARY HYPERTENSION 09/29/2008  . ALLERGIC RHINITIS 09/29/2008  . HYPERCHOLESTEROLEMIA 09/29/2008  . ANXIETY DEPRESSION 09/29/2008  . OBESITY 09/29/2008  . GERD 09/29/2008  . HYPERTENSION 09/29/2008    No echo data as of April, 2011 ( normal LV function by history)  . PEPTIC ULCER DISEASE 09/29/2008  . THYROTOXICOSIS 09/29/2008  . CHEST DISCOMFORT 05/30/2009    In October,2010  ,??GI??  . CAD 10/20/2008    Catheterization [facility] 2001, 70% circumflex / nuclear April, 2010 no ischemia, not gated with atrial fib  . Cough 04/12/2009    Her lisinopril held October, 2010, cough resolved  . Warfarin anticoagulation   . Peptic ulcer disease     . Fluid overload     Improvement with Zaroxolyn July, 2011  . Shortness of breath     Amiodarone will not be restarted  /     Pulmonary illness November, 2010, abnormal CT, sedimentation rate not elevated, Dr Melvyn Novas saw patient,, no desaturation walking April, 2011, plan to follow  . Chronic diastolic CHF (congestive heart failure) ([place])   . Ejection fraction   . Cancer [facility])     Lung Cancer    Past Surgical History  Procedure Laterality Date  . Tonsillectomy and adenoidectomy  1953  . Dilation and curettage of uterus  2004    x2  . 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    is allergic to penicillins; tramadol; amiodarone; atorvastatin; bystolic; cholestyramine; clonidine hydrochloride; lansoprazole; oxycodone-acetaminophen; prevalite; propoxyphene hcl; propoxyphene n-acetaminophen; and aspirin.    Medication List       This list is accurate as of: 04/28/15 11:59 PM.  Always use your most recent med list.               albuterol 108 (90 BASE) MCG/ACT inhaler  Commonly known as:  PROVENTIL HFA;VENTOLIN HFA  Inhale 2 puffs into the lungs every 6 (six) hours  as needed for wheezing or shortness of breath.     allopurinol 300 MG tablet  Commonly known as:  ZYLOPRIM  Take 300 mg by mouth every morning.     aspirin 325 MG tablet  Take 325 mg by mouth as needed.     atenolol 100 MG tablet  Commonly known as:  TENORMIN  Take 100 mg by mouth every morning.     chlorhexidine  0.12 % solution  Commonly known as:  PERIDEX  Rinse with 1/2 ounce by mouth for 30 seconds then spit out. use twice a day as needed for tooth issues     diltiazem 240 MG 24 hr capsule  Commonly known as:  CARDIZEM CD  Take 1 capsule (240 mg total) by mouth daily.     docusate sodium 100 MG capsule  Commonly known as:  COLACE  Take 100 mg by mouth daily as needed for mild constipation.     furosemide 80 MG tablet  Commonly known as:  LASIX  Take 40 mg by mouth every morning.     HYDROcodone-acetaminophen 5-325 MG tablet  Commonly known as:  NORCO/VICODIN  Take 1 tablet by mouth every 6 (six) hours as needed for moderate pain.     ibuprofen 200 MG tablet  Commonly known as:  ADVIL,MOTRIN  Take 200 mg by mouth every 6 (six) hours as needed for moderate pain.     latanoprost 0.005 % ophthalmic solution  Commonly known as:  XALATAN  Place 1 drop into both eyes at bedtime.     levothyroxine 150 MCG tablet  Commonly known as:  SYNTHROID, LEVOTHROID  Take 150 mcg by mouth every morning.     lisinopril 40 MG tablet  Commonly known as:  PRINIVIL,ZESTRIL  Take 40 mg by mouth every morning.     LORazepam 0.5 MG tablet  Commonly known as:  ATIVAN  Take 0.5 mg by mouth at bedtime as needed for anxiety or sleep.     minoxidil 2.5 MG tablet  Commonly known as:  LONITEN  Take 1 tablet by mouth every morning.     omeprazole 20 MG capsule  Commonly known as:  PRILOSEC  Take 20 mg by mouth every morning.     prochlorperazine 10 MG tablet  Commonly known as:  COMPAZINE  Take 1 tablet (10 mg total) by mouth every 6 (six) hours as needed for nausea or vomiting.     RADIAPLEX EX  Apply 1 application topically daily as needed (irritaiton on hands/arm).     sulfamethoxazole-trimethoprim 800-160 MG tablet  Commonly known as:  BACTRIM DS,SEPTRA DS  Take 1 tablet by mouth 2 (two) times daily.     traMADol 50 MG tablet  Commonly known as:  ULTRAM  Take 1 tablet (50 mg total) by mouth  every 6 (six) hours as needed.     traZODone 50 MG tablet  Commonly known as:  DESYREL  Take 1 tablet by mouth at bedtime as needed for sleep.     warfarin 7.5 MG tablet  Commonly known as:  COUMADIN     warfarin 5 MG tablet  Commonly known as:  COUMADIN  Take 5 mg by mouth at bedtime.         PHYSICAL EXAMINATION  Oncology Vitals 04/29/2015 04/27/2015 04/27/2015 04/27/2015 04/27/2015 04/27/2015 04/27/2015  Height - - - - - - -  Weight 90.039 kg - - - - - -  Weight (lbs) 198 lbs 8 oz - - - - - -  BMI (kg/m2) - - - - - - -  Temp - - 97.6 - - - -  Pulse 74 60 57 58 67 60 68  Resp _0 SpO2 100 96 95 100 99 100 100  BSA (m2) - - - - - - -   BP Readings from Last 3 Encounters:  04/29/15 146/83  04/27/15 103/55  04/25/15 143/73    Physical Exam  Constitutional: She is oriented to person, place, and time and well-developed, well-nourished, and in no distress.  HENT:  Head: Normocephalic and atraumatic.  Eyes: Conjunctivae and EOM are normal. Pupils are equal, round, and reactive to light. Right eye exhibits no discharge. Left eye exhibits no discharge. No scleral icterus.  Neck: Normal range of motion.  Pulmonary/Chest: Effort normal. No respiratory distress.  Musculoskeletal: Normal range of motion.  Neurological: She is alert and oriented to person, place, and time.  Skin: Skin is warm and dry. Rash noted. There is erythema. No pallor.  On exam.  It does appear that patient's left forearm is slowly healing.  The entire left forearm and dorsal hand with scabbing and areas of skin sloughing off.  There does not appear to be any new infection to the sites.   Psychiatric: Affect normal.  Nursing note and vitals reviewed.   LABORATORY DATA:. Hospital Outpatient Visit on 04/27/2015  Component Date Value Ref Range Status  . aPTT 04/27/2015 27  24 - 37 seconds Final  . WBC 04/27/2015 5.4  4.0 - 10.5 K/uL Final  . RBC 04/27/2015 3.38* 3.87 - 5.11 MIL/uL  Final  . Hemoglobin 04/27/2015 10.6* 12.0 - 15.0 g/dL Final  . HCT 04/27/2015 31.0* 36.0 - 46.0 % Final  . MCV 04/27/2015 91.7  78.0 - 100.0 fL Final  . MCH 04/27/2015 31.4  26.0 - 34.0 pg Final  . MCHC 04/27/2015 34.2  30.0 - 36.0 g/dL Final  . RDW 04/27/2015 17.6* 11.5 - 15.5 % Final  . Platelets 04/27/2015 157  150 - 400 K/uL Final  . Neutrophils Relative % 04/27/2015 95   Final  . Neutro Abs 04/27/2015 5.1  1.7 - 7.7 K/uL Final  . Lymphocytes Relative 04/27/2015 2   Final  . Lymphs Abs 04/27/2015 0.1* 0.7 - 4.0 K/uL Final  . Monocytes Relative 04/27/2015 3   Final  . Monocytes Absolute 04/27/2015 0.1  0.1 - 1.0 K/uL Final  . Eosinophils Relative 04/27/2015 0   Final  . Eosinophils Absolute 04/27/2015 0.0  0.0 - 0.7 K/uL Final  . Basophils Relative 04/27/2015 0   Final  . Basophils Absolute 04/27/2015 0.0  0.0 - 0.1 K/uL Final  . Prothrombin Time 04/27/2015 14.4  11.6 - 15.2 seconds Final  . INR 04/27/2015 1.10  0.00 - 1.49 Final   Left arm/hand:       RADIOGRAPHIC STUDIES: Ir Fluoro Guide Cv Line Right  04/27/2015  INDICATION: History of lung cancer. In need of durable intravenous access for chemotherapy administration. EXAM: IMPLANTED PORT A CATH PLACEMENT WITH ULTRASOUND AND FLUOROSCOPIC GUIDANCE COMPARISON:  PET-CT - 03/08/2015 MEDICATIONS: Vancomycin 1 gm IV; The antibiotic was administered within an appropriate time interval prior to skin puncture. ANESTHESIA/SEDATION: Versed 1.5 mg IV; Fentanyl 50 mcg IV; Total Moderate Sedation Time 27  minutes. CONTRAST:  None FLUOROSCOPY TIME:  30 seconds (10 mGy) COMPLICATIONS: None immediate PROCEDURE: The procedure, risks, benefits, and alternatives were explained to the patient. Questions regarding the procedure were encouraged and answered. The patient understands and  consents to the procedure. The right neck and chest were prepped with chlorhexidine in a sterile fashion, and a sterile drape was applied covering the operative field.  Maximum barrier sterile technique with sterile gowns and gloves were used for the procedure. A timeout was performed prior to the initiation of the procedure. Local anesthesia was provided with 1% lidocaine with epinephrine. After creating a small venotomy incision, a micropuncture kit was utilized to access the internal jugular vein under direct, real-time ultrasound guidance. Ultrasound image documentation was performed. The microwire was kinked to measure appropriate catheter length. A subcutaneous port pocket was then created along the upper chest wall utilizing a combination of sharp and blunt dissection. The pocket was irrigated with sterile saline. A single lumen ISP power injectable port was chosen for placement. The 8 Fr catheter was tunneled from the port pocket site to the venotomy incision. The port was placed in the pocket. The external catheter was trimmed to appropriate length. At the venotomy, an 8 Fr peel-away sheath was placed over a guidewire under fluoroscopic guidance. The catheter was then placed through the sheath and the sheath was removed. Final catheter positioning was confirmed and documented with a fluoroscopic spot radiograph. The port was accessed with a Huber needle, aspirated and flushed with heparinized saline. The venotomy site was closed with an interrupted 4-0 Vicryl suture. The port pocket incision was closed with interrupted 2-0 Vicryl suture and the skin was opposed with a running subcuticular 4-0 Vicryl suture. Dermabond and Steri-strips were applied to both incisions. Dressings were placed. The patient tolerated the procedure well without immediate post procedural complication. FINDINGS: After catheter placement, the tip lies within the superior cavoatrial junction. The catheter aspirates and flushes normally and is ready for immediate use. IMPRESSION: Successful placement of a right internal jugular approach power injectable Port-A-Cath. The catheter is ready for immediate  use. Electronically Signed   By: Sandi Mariscal M.D.   On: 04/27/2015 16:00   Ir US Guide Vasc Access Right  04/27/2015  INDICATION: History of lung cancer. In need of durable intravenous access for chemotherapy administration. EXAM: IMPLANTED PORT A CATH PLACEMENT WITH ULTRASOUND AND FLUOROSCOPIC GUIDANCE COMPARISON:  PET-CT - 03/08/2015 MEDICATIONS: Vancomycin 1 gm IV; The antibiotic was administered within an appropriate time interval prior to skin puncture. ANESTHESIA/SEDATION: Versed 1.5 mg IV; Fentanyl 50 mcg IV; Total Moderate Sedation Time 27  minutes. CONTRAST:  None FLUOROSCOPY TIME:  30 seconds (10 mGy) COMPLICATIONS: None immediate PROCEDURE: The procedure, risks, benefits, and alternatives were explained to the patient. Questions regarding the procedure were encouraged and answered. The patient understands and consents to the procedure. The right neck and chest were prepped with chlorhexidine in a sterile fashion, and a sterile drape was applied covering the operative field. Maximum barrier sterile technique with sterile gowns and gloves were used for the procedure. A timeout was performed prior to the initiation of the procedure. Local anesthesia was provided with 1% lidocaine with epinephrine. After creating a small venotomy incision, a micropuncture kit was utilized to access the internal jugular vein under direct, real-time ultrasound guidance. Ultrasound image documentation was performed. The microwire was kinked to measure appropriate catheter length. A subcutaneous port pocket was then created along the upper chest wall utilizing a combination of sharp and blunt dissection. The pocket was irrigated with sterile saline. A single lumen ISP power injectable port was chosen for placement. The 8 Fr catheter was tunneled from the port pocket site to the venotomy incision. The port was  placed in the pocket. The external catheter was trimmed to appropriate length. At the venotomy, an 8 Fr peel-away  sheath was placed over a guidewire under fluoroscopic guidance. The catheter was then placed through the sheath and the sheath was removed. Final catheter positioning was confirmed and documented with a fluoroscopic spot radiograph. The port was accessed with a Huber needle, aspirated and flushed with heparinized saline. The venotomy site was closed with an interrupted 4-0 Vicryl suture. The port pocket incision was closed with interrupted 2-0 Vicryl suture and the skin was opposed with a running subcuticular 4-0 Vicryl suture. Dermabond and Steri-strips were applied to both incisions. Dressings were placed. The patient tolerated the procedure well without immediate post procedural complication. FINDINGS: After catheter placement, the tip lies within the superior cavoatrial junction. The catheter aspirates and flushes normally and is ready for immediate use. IMPRESSION: Successful placement of a right internal jugular approach power injectable Port-A-Cath. The catheter is ready for immediate use. Electronically Signed   By: Sandi Mariscal M.D.   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   She also continues to receive daily radiation treatments as directed.  Patient is scheduled to return on 10/24//2016 for labs, visit, and chemotherapy.  Also, patient received a right upper chest Port-A-Cath just this past week.  It appears intact with no evidence of infection.  There is some mild bruising to the site only.      Chemotherapeutic agent or infusion extravasation Patient received cycle 2 of her carboplatin/paclitaxel chemotherapy peripherally-and apparently developed an extravasation to the left forearm and hand.  She has been applying cool compresses and elevating her arm above the level of her heart whenever possible.  She continues to have some increased redness  and stinging pain to the dorsal left hand; but has no edema, no warmth and no red streaks to the site.  The area has now started to scab and slough off.  Patient states that she is almost done with her antibiotics.  Patient denies any recent fevers or chills.  On exam.  It does appear that patient's left forearm is slowly healing.  The entire left forearm and dorsal hand with scabbing and areas of skin sloughing off.  There does not appear to be any new infection to the sites.  Patient states that the new pink/red skin underneath the scabs is tight and stings.  Gave patient samples of Aquaphor to try to the sites.  Patient is scheduled for port placement this coming Wednesday, 04/27/2015.           Patient stated understanding of all instructions; and was in agreement with this plan of care. The patient knows to call the clinic with any problems, questions or concerns.   Review/collaboration with Dr. Julien Nordmann regarding all aspects of patient's visit today.   Total time spent with patient was 15 minutes;  with greater than 75 percent of that time spent in face to face counseling regarding patient's symptoms,  and coordination of care and follow up.  Disclaimer:This dictation was prepared with Dragon/digital dictation along with [employer]. Any transcriptional errors that result from this process are unintentional.  Drue Second, NP 04/29/2015

## 2015-04-29 NOTE — Progress Notes (Signed)
Department of Radiation Oncology  Phone:  [phone] Fax:        [phone]  Weekly Treatment Note    Name: Kristy [name] Date: 04/29/2015 MRN: [id number] DOB: 1945/12/10   Current dose: 20 Gy  Current fraction: 10   MEDICATIONS: Current Outpatient Prescriptions  Medication Sig Dispense Refill  . albuterol (PROVENTIL HFA;VENTOLIN HFA) 108 (90 BASE) MCG/ACT inhaler Inhale 2 puffs into the lungs every 6 (six) hours as needed for wheezing or shortness of breath.    . allopurinol (ZYLOPRIM) 300 MG tablet Take 300 mg by mouth every morning.     Marland Kitchen aspirin 325 MG tablet Take 325 mg by mouth as needed.    Marland Kitchen atenolol (TENORMIN) 100 MG tablet Take 100 mg by mouth every morning.     . chlorhexidine (PERIDEX) 0.12 % solution Rinse with 1/2 ounce by mouth for 30 seconds then spit out. use twice a day as needed for tooth issues  0  . diltiazem (CARDIZEM CD) 240 MG 24 hr capsule Take 1 capsule (240 mg total) by mouth daily. 90 capsule 3  . docusate sodium (COLACE) 100 MG capsule Take 100 mg by mouth daily as needed for mild constipation.    . furosemide (LASIX) 80 MG tablet Take 40 mg by mouth every morning.   0  . HYDROcodone-acetaminophen (NORCO/VICODIN) 5-325 MG per tablet Take 1 tablet by mouth every 6 (six) hours as needed for moderate pain.    Marland Kitchen ibuprofen (ADVIL,MOTRIN) 200 MG tablet Take 200 mg by mouth every 6 (six) hours as needed for moderate pain.     Marland Kitchen latanoprost (XALATAN) 0.005 % ophthalmic solution Place 1 drop into both eyes at bedtime.    Marland Kitchen levothyroxine (SYNTHROID, LEVOTHROID) 150 MCG tablet Take 150 mcg by mouth every morning.     Marland Kitchen lisinopril (PRINIVIL,ZESTRIL) 40 MG tablet Take 40 mg by mouth every morning.     Marland Kitchen LORazepam (ATIVAN) 0.5 MG tablet Take 0.5 mg by mouth at bedtime as needed for anxiety or sleep.     . minoxidil (LONITEN) 2.5 MG tablet Take 1 tablet by mouth every morning.     Marland Kitchen omeprazole (PRILOSEC) 20 MG capsule Take 20 mg by mouth every morning.     .  prochlorperazine (COMPAZINE) 10 MG tablet Take 1 tablet (10 mg total) by mouth every 6 (six) hours as needed for nausea or vomiting. 30 tablet 0  . sulfamethoxazole-trimethoprim (BACTRIM DS,SEPTRA DS) 800-160 MG tablet Take 1 tablet by mouth 2 (two) times daily. 20 tablet 0  . traMADol (ULTRAM) 50 MG tablet Take 1 tablet (50 mg total) by mouth every 6 (six) hours as needed. (Patient taking differently: Take 50 mg by mouth every 6 (six) hours as needed for moderate pain. ) 30 tablet 0  . traZODone (DESYREL) 50 MG tablet Take 1 tablet by mouth at bedtime as needed for sleep.     Marland Kitchen warfarin (COUMADIN) 5 MG tablet Take 5 mg by mouth at bedtime.     Marland Kitchen warfarin (COUMADIN) 7.5 MG tablet   0  . Wound Cleansers (RADIAPLEX EX) Apply 1 application topically daily as needed (irritaiton on hands/arm).     . sucralfate (CARAFATE) 1 G tablet Take 1 tablet (1 g total) by mouth 4 (four) times daily. 120 tablet 2   No current facility-administered medications for this encounter.     ALLERGIES: Penicillins; Tramadol; Amiodarone; Atorvastatin; Bystolic; Cholestyramine; Clonidine hydrochloride; Lansoprazole; Oxycodone-acetaminophen; Prevalite; Propoxyphene hcl; Propoxyphene n-acetaminophen; and Aspirin   LABORATORY DATA:  Lab Results  Component Value Date   WBC 5.4 04/27/2015   HGB 10.6* 04/27/2015   HCT 31.0* 04/27/2015   MCV 91.7 04/27/2015   PLT 157 04/27/2015   Lab Results  Component Value Date   NA 136 04/25/2015   K 4.9 04/25/2015   CL 99* 03/09/2015   CO2 21* 04/25/2015   Lab Results  Component Value Date   ALT 12 04/25/2015   AST 14 04/25/2015   ALKPHOS 68 04/25/2015   BILITOT 1.29* 04/25/2015     NARRATIVE: Chasya P Funez was seen today for weekly treatment management. The chart was checked and the patient's films were reviewed.  The patient states that she is feeling reasonably well this week. She continues to take antibiotics for cellulitis. She does complain of some esophagitis  which has emerged over the last week.  BP 146/83 mmHg  Pulse 74  Resp 16  Wt 198 lb 8 oz (90.039 kg)  SpO2 100% Wt Readings from Last 3 Encounters:  04/29/15 198 lb 8 oz (90.039 kg)  04/25/15 191 lb 9.6 oz (86.909 kg)  04/22/15 192 lb 6.4 oz (87.272 kg)     PHYSICAL EXAMINATION: weight is 198 lb 8 oz (90.039 kg). Her blood pressure is 146/83 and her pulse is 74. Her respiration is 16 and oxygen saturation is 100%.        ASSESSMENT: The patient is doing satisfactorily with treatment. She is beginning to experience some esophagitis.  PLAN: We will continue with the patient's radiation treatment as planned. A prescription for Carafate has been called into her pharmacy.  This document serves as a record of services personally performed by Kyung Rudd, MD. It was created on his behalf by Arlyce Harman, a trained medical scribe. The creation of this record is based on the scribe's personal observations and the provider's statements to them. This document has been checked and approved by the attending provider. ------------------------------------------------  Jodelle Gross, MD, PhD

## 2015-04-29 NOTE — Assessment & Plan Note (Signed)
Patient received cycle 2 of her carboplatin/paclitaxel chemotherapy peripherally-and apparently developed an extravasation to the left forearm and hand.  She has been applying cool compresses and elevating her arm above the level of her heart whenever possible.  She continues to have some increased redness and stinging pain to the dorsal left hand; but has no edema, no warmth and no red streaks to the site.  The area has now started to scab and slough off.  Patient states that she is almost done with her antibiotics.  Patient denies any recent fevers or chills.  On exam.  It does appear that patient's left forearm is slowly healing.  The entire left forearm and dorsal hand with scabbing and areas of skin sloughing off.  There does not appear to be any new infection to the sites.  Patient states that the new pink/red skin underneath the scabs is tight and stings.  Gave patient samples of Aquaphor to try to the sites.  Patient is scheduled for port placement this coming Wednesday, 04/27/2015.

## 2015-04-29 NOTE — Progress Notes (Signed)
Weight and vitals stable. Reports intermittent pain in her left arm related to effects of cellulitis. Patient taking antibiotic as prescribed to manage cellulitis. Reports she continues to use Radiaplex as directed. Denies skin changes within treatment field. Reports intermittent episodes of difficulty swallowing. Denies pain associated with swallowing. Reports a dry cough and SOB with exertion continues.  BP 146/83 mmHg  Pulse 74  Resp 16  Wt 198 lb 8 oz (90.039 kg)  SpO2 100% Wt Readings from Last 3 Encounters:  04/29/15 198 lb 8 oz (90.039 kg)  04/25/15 191 lb 9.6 oz (86.909 kg)  04/22/15 192 lb 6.4 oz (87.272 kg)

## 2015-04-29 NOTE — Assessment & Plan Note (Signed)
Patient received cycle 4 of her carboplatin/paclitaxel chemotherapy regimen on 04/25/15.   She also continues to receive daily radiation treatments as directed.  Patient is scheduled to return on 10/24//2016 for labs, visit, and chemotherapy.  Also, patient received a right upper chest Port-A-Cath just this past week.  It appears intact with no evidence of infection.  There is some mild bruising to the site only.

## 2015-05-02 ENCOUNTER — Ambulatory Visit
Admission: RE | Admit: 2015-05-02 | Discharge: 2015-05-02 | Disposition: A | Discharge status: Home / Self Care | Payer: Medicare Other | Source: Ambulatory Visit | Attending: Radiation Oncology | Admitting: Radiation Oncology

## 2015-05-02 ENCOUNTER — Other Ambulatory Visit (HOSPITAL_BASED_OUTPATIENT_CLINIC_OR_DEPARTMENT_OTHER): Payer: Medicare Other

## 2015-05-02 ENCOUNTER — Ambulatory Visit (HOSPITAL_BASED_OUTPATIENT_CLINIC_OR_DEPARTMENT_OTHER): Payer: Medicare Other

## 2015-05-02 ENCOUNTER — Other Ambulatory Visit: Payer: Self-pay

## 2015-05-02 VITALS — BP 131/69 | HR 88 | Temp 97.0°F | Resp 24

## 2015-05-02 DIAGNOSIS — C3431 Malignant neoplasm of lower lobe, right bronchus or lung: Secondary | ICD-10-CM

## 2015-05-02 DIAGNOSIS — Z510 Encounter for antineoplastic radiation therapy: Secondary | ICD-10-CM | POA: Diagnosis not present

## 2015-05-02 LAB — COMPREHENSIVE METABOLIC PANEL (CC13)
ALBUMIN: 3 g/dL — AB (ref 3.5–5.0)
ALK PHOS: 61 U/L (ref 40–150)
ALT: 10 U/L (ref 0–55)
AST: 11 U/L (ref 5–34)
Anion Gap: 6 mEq/L (ref 3–11)
BUN: 35 mg/dL — AB (ref 7.0–26.0)
CALCIUM: 8.9 mg/dL (ref 8.4–10.4)
CO2: 24 mEq/L (ref 22–29)
Chloride: 108 mEq/L (ref 98–109)
Creatinine: 2.1 mg/dL — ABNORMAL HIGH (ref 0.6–1.1)
EGFR: 23 mL/min/{1.73_m2} — ABNORMAL LOW (ref >90)
Glucose: 118 mg/dl (ref 70–140)
POTASSIUM: 4.6 meq/L (ref 3.5–5.1)
Sodium: 138 mEq/L (ref 136–145)
Total Bilirubin: 0.98 mg/dL (ref 0.20–1.20)
Total Protein: 5.2 g/dL — ABNORMAL LOW (ref 6.4–8.3)

## 2015-05-02 LAB — CBC WITH DIFFERENTIAL/PLATELET
BASO%: 1 % (ref 0.0–2.0)
Basophils Absolute: 0 10*3/uL (ref 0.0–0.1)
EOS ABS: 0 10*3/uL (ref 0.0–0.5)
EOS%: 1.5 % (ref 0.0–7.0)
HEMATOCRIT: 31.5 % — AB (ref 34.8–46.6)
HEMOGLOBIN: 10.8 g/dL — AB (ref 11.6–15.9)
LYMPH%: 12.1 % — AB (ref 14.0–49.7)
MCH: 31.9 pg (ref 25.1–34.0)
MCHC: 34.4 g/dL (ref 31.5–36.0)
MCV: 92.8 fL (ref 79.5–101.0)
MONO#: 0.1 10*3/uL (ref 0.1–0.9)
MONO%: 2.5 % (ref 0.0–14.0)
NEUT%: 82.9 % — ABNORMAL HIGH (ref 38.4–76.8)
NEUTROS ABS: 2.4 10*3/uL (ref 1.5–6.5)
PLATELETS: 88 10*3/uL — AB (ref 145–400)
RBC: 3.4 10*6/uL — ABNORMAL LOW (ref 3.70–5.45)
RDW: 19.2 % — AB (ref 11.2–14.5)
WBC: 2.9 10*3/uL — AB (ref 3.9–10.3)
lymph#: 0.4 10*3/uL — ABNORMAL LOW (ref 0.9–3.3)

## 2015-05-02 MED ORDER — SODIUM CHLORIDE 0.9 % IJ SOLN
10.0000 mL | INTRAMUSCULAR | Status: DC | PRN
  Administered 2015-05-02: 10 mL via INTRAVENOUS
  Filled 2015-05-02: qty 10

## 2015-05-02 MED ORDER — LIDOCAINE-PRILOCAINE 2.5-2.5 % EX CREA: TOPICAL_CREAM | CUTANEOUS | Status: AC

## 2015-05-02 MED ORDER — HEPARIN SOD (PORK) LOCK FLUSH 100 UNIT/ML IV SOLN
500.0000 [IU] | Freq: Once | INTRAVENOUS | Status: AC
Start: 2015-05-02 — End: 2015-05-02
  Administered 2015-05-02: 500 [IU] via INTRAVENOUS
  Filled 2015-05-02: qty 5

## 2015-05-02 NOTE — Progress Notes (Signed)
Port accessed and then found platelets to be 88. Dr Julien Nordmann called and dc'd treatment for this week. Apologized to pt for accessing port. She was accepting.

## 2015-05-03 ENCOUNTER — Ambulatory Visit
Admission: RE | Admit: 2015-05-03 | Discharge: 2015-05-03 | Disposition: A | Discharge status: Home / Self Care | Payer: Medicare Other | Source: Ambulatory Visit | Attending: Radiation Oncology | Admitting: Radiation Oncology

## 2015-05-03 DIAGNOSIS — Z510 Encounter for antineoplastic radiation therapy: Secondary | ICD-10-CM | POA: Diagnosis not present

## 2015-05-04 ENCOUNTER — Ambulatory Visit
Admission: RE | Admit: 2015-05-04 | Discharge: 2015-05-04 | Disposition: A | Discharge status: Home / Self Care | Payer: Medicare Other | Source: Ambulatory Visit | Attending: Radiation Oncology | Admitting: Radiation Oncology

## 2015-05-04 DIAGNOSIS — Z510 Encounter for antineoplastic radiation therapy: Secondary | ICD-10-CM | POA: Diagnosis not present

## 2015-05-05 ENCOUNTER — Ambulatory Visit
Admission: RE | Admit: 2015-05-05 | Discharge: 2015-05-05 | Disposition: A | Discharge status: Home / Self Care | Payer: Medicare Other | Source: Ambulatory Visit | Attending: Radiation Oncology | Admitting: Radiation Oncology

## 2015-05-05 DIAGNOSIS — Z510 Encounter for antineoplastic radiation therapy: Secondary | ICD-10-CM | POA: Diagnosis not present

## 2015-05-06 ENCOUNTER — Encounter: Payer: Self-pay | Admitting: Radiation Oncology

## 2015-05-06 ENCOUNTER — Ambulatory Visit
Admission: RE | Admit: 2015-05-06 | Discharge: 2015-05-06 | Disposition: A | Discharge status: Home / Self Care | Payer: Medicare Other | Source: Ambulatory Visit | Attending: Radiation Oncology | Admitting: Radiation Oncology

## 2015-05-06 VITALS — BP 128/80 | HR 78 | Resp 16 | Wt 197.1 lb

## 2015-05-06 DIAGNOSIS — Z510 Encounter for antineoplastic radiation therapy: Secondary | ICD-10-CM | POA: Diagnosis not present

## 2015-05-06 DIAGNOSIS — C3431 Malignant neoplasm of lower lobe, right bronchus or lung: Secondary | ICD-10-CM

## 2015-05-06 NOTE — Progress Notes (Signed)
  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  Weight and vitals stable. Reports intermittent pain in her left arm related to effects of cellulitis. Skin of left arm sloughing off from effects of cellulitis. Denies skin changes within treatment field. Reports using radiaplex bid as directed. Reports intermittent episodes of difficulty swallowing managed with Carafate. Reports SOB with exertion, for example, sitting to standing. Reports when she blew her nose this morning she saw scant bright red blood on the tissue. Reports fatigue.  The patient is without complaint. Set-up films were reviewed. The chart was checked.  Physical Findings  weight is 197 lb 1.6 oz (89.404 kg). Her blood pressure is 128/80 and her pulse is 78. Her respiration is 16 and oxygen saturation is 100%. . Weight essentially stable. Shiny erythema and edema of the left hand related to recent cellulitis.  Impression The patient is tolerating radiation.  Plan Continue treatment as planned.      [name] Tammi Klippel, M.D.  This document serves as a record of services personally performed by Tyler Pita, MD. It was created on his behalf by Lendon Collar, a trained medical scribe. The creation of this record is based on the scribe's personal observations and the provider's statements to them. This document has been checked and approved by the attending provider.

## 2015-05-06 NOTE — Progress Notes (Signed)
Weight and vitals stable. Reports intermittent pain in her left arm related to effects of cellulitis. Skin of left arm sloughing off from effects of cellulitis. Denies skin changes within treatment field. Reports using radiaplex bid as directed. Reports intermittent episodes of difficulty swallowing managed with Carafate. Reports SOB with exertion. Reports when she blew her nose this morning she saw scant bright red blood on the tissue. Reports fatigue.   BP 128/80 mmHg  Pulse 78  Resp 16  Wt 197 lb 1.6 oz (89.404 kg)  SpO2 100% Wt Readings from Last 3 Encounters:  05/06/15 197 lb 1.6 oz (89.404 kg)  04/29/15 198 lb 8 oz (90.039 kg)  04/25/15 191 lb 9.6 oz (86.909 kg)

## 2015-05-09 ENCOUNTER — Ambulatory Visit: Payer: Medicare Other

## 2015-05-09 ENCOUNTER — Ambulatory Visit (HOSPITAL_COMMUNITY)
Admission: RE | Admit: 2015-05-09 | Discharge: 2015-05-09 | Disposition: A | Discharge status: Home / Self Care | Payer: Medicare Other | Source: Ambulatory Visit | Attending: Nurse Practitioner | Admitting: Nurse Practitioner

## 2015-05-09 ENCOUNTER — Ambulatory Visit
Admission: RE | Admit: 2015-05-09 | Discharge: 2015-05-09 | Disposition: A | Discharge status: Home / Self Care | Payer: Medicare Other | Source: Ambulatory Visit | Attending: Radiation Oncology | Admitting: Radiation Oncology

## 2015-05-09 ENCOUNTER — Encounter: Payer: Self-pay | Admitting: Anatomic Pathology & Clinical Pathology

## 2015-05-09 ENCOUNTER — Other Ambulatory Visit (HOSPITAL_BASED_OUTPATIENT_CLINIC_OR_DEPARTMENT_OTHER): Payer: Medicare Other

## 2015-05-09 ENCOUNTER — Ambulatory Visit (HOSPITAL_BASED_OUTPATIENT_CLINIC_OR_DEPARTMENT_OTHER): Payer: Medicare Other | Admitting: Nurse Practitioner

## 2015-05-09 ENCOUNTER — Encounter: Payer: Self-pay | Admitting: Nurse Practitioner

## 2015-05-09 ENCOUNTER — Telehealth: Payer: Self-pay | Admitting: *Deleted

## 2015-05-09 ENCOUNTER — Other Ambulatory Visit: Payer: Self-pay | Admitting: Anatomic Pathology & Clinical Pathology

## 2015-05-09 ENCOUNTER — Other Ambulatory Visit: Payer: Medicare Other

## 2015-05-09 VITALS — BP 144/78 | HR 77 | Temp 99.2°F | Resp 20 | Ht 66.0 in | Wt 195.3 lb

## 2015-05-09 DIAGNOSIS — Z510 Encounter for antineoplastic radiation therapy: Secondary | ICD-10-CM | POA: Diagnosis not present

## 2015-05-09 DIAGNOSIS — T451X5A Adverse effect of antineoplastic and immunosuppressive drugs, initial encounter: Secondary | ICD-10-CM

## 2015-05-09 DIAGNOSIS — C3431 Malignant neoplasm of lower lobe, right bronchus or lung: Secondary | ICD-10-CM | POA: Diagnosis present

## 2015-05-09 DIAGNOSIS — Z7901 Long term (current) use of anticoagulants: Secondary | ICD-10-CM

## 2015-05-09 DIAGNOSIS — I803 Phlebitis and thrombophlebitis of lower extremities, unspecified: Secondary | ICD-10-CM | POA: Insufficient documentation

## 2015-05-09 DIAGNOSIS — T07XXXA Unspecified multiple injuries, initial encounter: Secondary | ICD-10-CM | POA: Insufficient documentation

## 2015-05-09 DIAGNOSIS — I4891 Unspecified atrial fibrillation: Secondary | ICD-10-CM | POA: Diagnosis not present

## 2015-05-09 DIAGNOSIS — R609 Edema, unspecified: Secondary | ICD-10-CM

## 2015-05-09 DIAGNOSIS — I824Z1 Acute embolism and thrombosis of unspecified deep veins of right distal lower extremity: Secondary | ICD-10-CM

## 2015-05-09 DIAGNOSIS — D61810 Antineoplastic chemotherapy induced pancytopenia: Secondary | ICD-10-CM | POA: Insufficient documentation

## 2015-05-09 DIAGNOSIS — I82492 Acute embolism and thrombosis of other specified deep vein of left lower extremity: Secondary | ICD-10-CM

## 2015-05-09 DIAGNOSIS — S50812A Abrasion of left forearm, initial encounter: Secondary | ICD-10-CM | POA: Diagnosis not present

## 2015-05-09 DIAGNOSIS — N189 Chronic kidney disease, unspecified: Secondary | ICD-10-CM

## 2015-05-09 DIAGNOSIS — S50811A Abrasion of right forearm, initial encounter: Secondary | ICD-10-CM | POA: Diagnosis not present

## 2015-05-09 DIAGNOSIS — T8089XD Other complications following infusion, transfusion and therapeutic injection, subsequent encounter: Secondary | ICD-10-CM

## 2015-05-09 DIAGNOSIS — M7989 Other specified soft tissue disorders: Secondary | ICD-10-CM | POA: Insufficient documentation

## 2015-05-09 DIAGNOSIS — S80811A Abrasion, right lower leg, initial encounter: Secondary | ICD-10-CM

## 2015-05-09 LAB — CBC WITH DIFFERENTIAL/PLATELET
BASO%: 3.9 % — ABNORMAL HIGH (ref 0.0–2.0)
Basophils Absolute: 0.1 10*3/uL (ref 0.0–0.1)
EOS ABS: 0 10*3/uL (ref 0.0–0.5)
EOS%: 3 % (ref 0.0–7.0)
HCT: 28.4 % — ABNORMAL LOW (ref 34.8–46.6)
HGB: 9.5 g/dL — ABNORMAL LOW (ref 11.6–15.9)
LYMPH%: 11.6 % — AB (ref 14.0–49.7)
MCH: 31.9 pg (ref 25.1–34.0)
MCHC: 33.6 g/dL (ref 31.5–36.0)
MCV: 94.9 fL (ref 79.5–101.0)
MONO#: 0.2 10*3/uL (ref 0.1–0.9)
MONO%: 13.7 % (ref 0.0–14.0)
NEUT#: 1.1 10*3/uL — ABNORMAL LOW (ref 1.5–6.5)
NEUT%: 67.8 % (ref 38.4–76.8)
PLATELETS: 77 10*3/uL — AB (ref 145–400)
RBC: 2.99 10*6/uL — AB (ref 3.70–5.45)
RDW: 21.6 % — ABNORMAL HIGH (ref 11.2–14.5)
WBC: 1.6 10*3/uL — ABNORMAL LOW (ref 3.9–10.3)
lymph#: 0.2 10*3/uL — ABNORMAL LOW (ref 0.9–3.3)

## 2015-05-09 LAB — COMPREHENSIVE METABOLIC PANEL (CC13)
ALT: 9 U/L (ref 0–55)
ANION GAP: 7 meq/L (ref 3–11)
AST: 13 U/L (ref 5–34)
Albumin: 3.1 g/dL — ABNORMAL LOW (ref 3.5–5.0)
Alkaline Phosphatase: 63 U/L (ref 40–150)
BILIRUBIN TOTAL: 1.34 mg/dL — AB (ref 0.20–1.20)
BUN: 27.5 mg/dL — ABNORMAL HIGH (ref 7.0–26.0)
CHLORIDE: 109 meq/L (ref 98–109)
CO2: 24 meq/L (ref 22–29)
Calcium: 8.9 mg/dL (ref 8.4–10.4)
Creatinine: 1.7 mg/dL — ABNORMAL HIGH (ref 0.6–1.1)
EGFR: 29 mL/min/{1.73_m2} — AB (ref >90)
Glucose: 115 mg/dl (ref 70–140)
POTASSIUM: 4.2 meq/L (ref 3.5–5.1)
Sodium: 141 mEq/L (ref 136–145)
Total Protein: 5.3 g/dL — ABNORMAL LOW (ref 6.4–8.3)

## 2015-05-09 MED ORDER — APIXABAN 5 MG PO TABS: ORAL_TABLET | ORAL | Status: DC

## 2015-05-09 NOTE — Progress Notes (Signed)
VASCULAR LAB PRELIMINARY  PRELIMINARY  PRELIMINARY  PRELIMINARY  Bilateral lower extremity venous duplex  completed.    Preliminary report:  Right:  DVT noted in the gastrocnemius vein and PTV.  No evidence of superficial thrombosis.  No Baker's cyst.  Left: DVT noted in the gastrocnemius vein, PTV, and peroneal v.  No evidence of superficial thrombosis.  No Baker's cyst.   Jatasia Gundrum, RVT 05/09/2015, 11:25 AM

## 2015-05-09 NOTE — Assessment & Plan Note (Addendum)
Patient continues to take Coumadin on a daily basis for treatment of her chronic atrial fib per her primary care physician Dr. Herbie Baltimore prior.  Last INR check care the cancer Center was 1.1.  It was noted on exam today, the patient's right leg was slightly larger than the left.  Patient is to undergo a Doppler ultrasound for further evaluation of the right leg edema today; and then we'll review our results with Dr. prior.  Patient has mentioned that it is very difficult for to get to her primary care provider's office for INR checks; and would prefer to have her blood drawn for her INR checks here at the cancer center.  She requests that these results be faxed or called to Dr. Lenord Carbo office so he can manage her Coumadin levels. ________________________________________________________________________  Update: Doppler ultrasound obtained today was positive for DVTs to bilateral lower extremities.After reviewing all findings with both Dr. Julien Nordmann and patient's primary care provider Dr. Suszanne Finch. Kipp Brood has made the decision to hold any further Coumadin; and to initiate Eliquis treatment for both chronic atrial fib, and newly diagnosed DVT.  Prescription for Eliquis will be given to the patient.

## 2015-05-09 NOTE — Telephone Encounter (Signed)
Called [name] in [place] to relay message to patient about new medication waiting at pt pharmacy.  Pt is free to go home after radiation. [name] ensured he would give patient the information.

## 2015-05-09 NOTE — Assessment & Plan Note (Signed)
Pt has multiple healing abrasions to her right anterior shin, right forearm, and left forearm.  Patient states that she is constantly bumping into things.  There is no evidence of infection to these sites.

## 2015-05-09 NOTE — Assessment & Plan Note (Signed)
Patient received peripheral carboplatin/paclitaxel chemotherapy to her left forearm for cycle 2 of her treatment.  She presented approximately one week later with extravasation to the left forearm.  Patient states that she has completed all of her Bactrim antibiotics for treatment of any possible cellulitis associated with the extravasation.  On exam today.-It does appear the patient's left forearm has essentially healed.  She continues with some significant sloughing of the skin to the left dorsal side of her hand.  There is no evidence of active infection.  Patient was advised to continue using Aquaphor to the sites; and to let us know if it does not continue to heal.  Patient was able to obtain a Port-A-Cath to the right upper chest on 04/27/2015 for any further chemotherapy infusions.

## 2015-05-09 NOTE — Assessment & Plan Note (Signed)
Patient had last week's chemotherapy held due to thrombocytopenia.  Patient presented to the [facility] today to receive her chemotherapy-and was noted once again to have low blood counts.  WBC is 1.6, ANC is 1.1, hemoglobin is 9.5, amplitude count has decreased from 88 down to 77.  Patient denies any worsening issues with either easy bleeding or bruising.  Patient also denies any recent fevers or chills.  Reviewed all neutropenia guidelines with both patient and her husband once again today.  After reviewing all findings with Dr. Webb Laws decision was made to hold chemotherapy today to allow her blood counts to recover.

## 2015-05-09 NOTE — Assessment & Plan Note (Signed)
Patient has a history of some chronic hyperbilirubinemia.  Bilirubin was 0.98.  Last week; and bilirubin today has increased to 1.34.  Patient denies any GI symptoms whatsoever.  The current time.  We'll continue to monitor closely.

## 2015-05-09 NOTE — Assessment & Plan Note (Addendum)
Patient received cycle 4 of her weekly carboplatin/paclitaxel chemotherapy on 04/25/2015.  Her chemotherapy was held last week due to thrombocytopenia.  Blood counts obtained today reveal a WBC of 1.6, ANC 1.1, hemoglobin 9.5, platelet count 77.  Decision was made to hold chemotherapy once again today due to thrombocytopenia.  Patient will also continue to receive radiation treatments as scheduled.  Patient is scheduled to return on Monday, 05/16/2015 for labs, visit, and her next cycle of chemotherapy.

## 2015-05-09 NOTE — Progress Notes (Signed)
Title of Study: PATHOLOGY PROCUREMENT  Description: Customer service manager for the Discovery and Validation of Biomarkers for the Prediction, Diagnosis and Management of Disease  Principal Investigator: Enid Cutter, MD office [phone]  Study Coordinator: Rodena Goldmann cell [phone]  IRB #: [phone]  Pending RESEARCH ENCOUNTER  Blood Collection Tubes will be provided by coordinator.  Rodena Goldmann  Cell [phone]

## 2015-05-09 NOTE — Assessment & Plan Note (Addendum)
Patient has a history of CHF; and has some chronic bilateral lower extremity peripheral edema.  She also continues to take Coumadin per her primary care physician Dr. Thomes Dinning for treatment of atrial fib.  It was noted today that patient does have slightly increased edema to the right leg versus the left leg today.  She has no calf pain or tenderness.  She also denies any chest pain, chest pressure, shortness breath, or pain with inspiration.  Will obtain a Doppler ultrasound today for further evaluation.  After obtaining a Doppler ultrasound results-will follow-up/review with Dr. Kipp Brood in regards to Coumadin checks.  Patient's last INR on October 19 was 1.1. _______________________________________________________________________________________________________________________________  Update: Doppler ultrasound obtained today  revealed DVTs to bilateral lower extremities.Reviewed these findings with Dr. Julien Nordmann and Dr. Thomes Dinning primary care provider.  Dr. Kipp Brood prefers to switch the Coumadin to elevate request treatment.  New prescription will be given for the Eliquis.

## 2015-05-09 NOTE — Progress Notes (Signed)
SYMPTOM MANAGEMENT CLINIC   HPI: Kristy [name] 69 y.o. female diagnosed with non-small cell lung cancer.  Currently undergoing weekly carboplatin/paclitaxel chemotherapy and concurrent radiation treatments.   Patient presented to the cancer Center today to receive cycle 5 of her chemotherapy.  She states that her left forearm extravasation site is slowly resolving.  She has no other complaints whatsoever.  She denies any recent fevers or chills.  HPI  ROS  Past Medical History  Diagnosis Date  . Atrial fibrillation (HCC) 09/29/2008    Paroxysmal, amiodarone therapy with cardioversion to sinus rhythm in the past. / cardioversion on higher dose amiodarone successful October, 2010 / amiodarone stopped or pulmonary illness November, 2010, patient improved, amiodarone not restarted / regular rhythm January, 2011 / returned atrial fibrillation by March, 2011... plan rate control  . RENAL INSUFFICIENCY 09/29/2008    Creatinine 1.9  . VENTRICULAR HYPERTROPHY, LEFT 09/29/2008  . PULMONARY HYPERTENSION 09/29/2008  . ALLERGIC RHINITIS 09/29/2008  . HYPERCHOLESTEROLEMIA 09/29/2008  . ANXIETY DEPRESSION 09/29/2008  . OBESITY 09/29/2008  . GERD 09/29/2008  . HYPERTENSION 09/29/2008    No echo data as of April, 2011 ( normal LV function by history)  . PEPTIC ULCER DISEASE 09/29/2008  . THYROTOXICOSIS 09/29/2008  . CHEST DISCOMFORT 05/30/2009    In October,2010  ,??GI??  . CAD 10/20/2008    Catheterization [facility] 2001, 70% circumflex / nuclear April, 2010 no ischemia, not gated with atrial fib  . Cough 04/12/2009    Her lisinopril held October, 2010, cough resolved  . Warfarin anticoagulation   . Peptic ulcer disease   . Fluid overload     Improvement with Zaroxolyn July, 2011  . Shortness of breath     Amiodarone will not be restarted  /     Pulmonary illness November, 2010, abnormal CT, sedimentation rate not elevated, Dr Sherene Sires saw patient,, no desaturation walking April, 2011, plan to follow  .  Chronic diastolic CHF (congestive heart failure) (HCC)   . Ejection fraction   . Cancer [facility])     Lung Cancer    Past Surgical History  Procedure Laterality Date  . Tonsillectomy and adenoidectomy  1953  . Dilation and curettage of uterus  2004    x2  . 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    is allergic to penicillins; tramadol; amiodarone; atorvastatin; bystolic; cholestyramine; clonidine hydrochloride; lansoprazole; oxycodone-acetaminophen; prevalite; propoxyphene hcl; propoxyphene n-acetaminophen; and aspirin.    Medication List       This list is accurate as of: 05/09/15 12:40 PM.  Always use your most recent med list.               albuterol 108 (90 BASE) MCG/ACT inhaler  Commonly known as:  PROVENTIL HFA;VENTOLIN HFA  Inhale 2 puffs into the lungs every 6 (six) hours as needed for wheezing or shortness of breath.     allopurinol 300 MG tablet  Commonly known as:  ZYLOPRIM  Take 300 mg by mouth every morning.     aspirin 325 MG  tablet  Take 325 mg by mouth as needed.     atenolol 100 MG tablet  Commonly known as:  TENORMIN  Take 100 mg by mouth every morning.     chlorhexidine 0.12 % solution  Commonly known as:  PERIDEX  Rinse with 1/2 ounce by mouth for 30 seconds then spit out. use twice a day as needed  for tooth issues     diltiazem 240 MG 24 hr capsule  Commonly known as:  CARDIZEM CD  Take 1 capsule (240 mg total) by mouth daily.     docusate sodium 100 MG capsule  Commonly known as:  COLACE  Take 100 mg by mouth daily as needed for mild constipation.     furosemide 80 MG tablet  Commonly known as:  LASIX  Take 40 mg by mouth every morning.     HYDROcodone-acetaminophen 5-325 MG tablet  Commonly known as:  NORCO/VICODIN  Take 1 tablet by mouth every 6 (six) hours as needed for moderate pain.     ibuprofen 200 MG tablet  Commonly known as:  ADVIL,MOTRIN  Take 200 mg by mouth every 6 (six) hours as needed for moderate pain.     latanoprost 0.005 % ophthalmic solution  Commonly known as:  XALATAN  Place 1 drop into both eyes at bedtime.     levothyroxine 150 MCG tablet  Commonly known as:  SYNTHROID, LEVOTHROID  Take 150 mcg by mouth every morning.     lidocaine-prilocaine cream  Commonly known as:  EMLA  Apply to port 1-2 hours before accessing     lisinopril 40 MG tablet  Commonly known as:  PRINIVIL,ZESTRIL  Take 40 mg by mouth every morning.     LORazepam 0.5 MG tablet  Commonly known as:  ATIVAN  Take 0.5 mg by mouth at bedtime as needed for anxiety or sleep.     minoxidil 2.5 MG tablet  Commonly known as:  LONITEN  Take 1 tablet by mouth every morning.     omeprazole 20 MG capsule  Commonly known as:  PRILOSEC  Take 20 mg by mouth every morning.     prochlorperazine 10 MG tablet  Commonly known as:  COMPAZINE  Take 1 tablet (10 mg total) by mouth every 6 (six) hours as needed for nausea or vomiting.     RADIAPLEX EX  Apply 1 application topically daily as needed (irritaiton on hands/arm).     sucralfate 1 G tablet  Commonly known as:  CARAFATE  Take 1 tablet (1 g total) by mouth 4 (four) times daily.     sulfamethoxazole-trimethoprim 800-160 MG tablet  Commonly known as:  BACTRIM DS,SEPTRA DS  Take 1 tablet by mouth 2 (two) times daily.      traMADol 50 MG tablet  Commonly known as:  ULTRAM  Take 1 tablet (50 mg total) by mouth every 6 (six) hours as needed.     traZODone 50 MG tablet  Commonly known as:  DESYREL  Take 1 tablet by mouth at bedtime as needed for sleep.     warfarin 7.5 MG tablet  Commonly known as:  COUMADIN     warfarin 5 MG tablet  Commonly known as:  COUMADIN  Take 5 mg by mouth at bedtime.         PHYSICAL EXAMINATION  Oncology Vitals 05/09/2015 05/06/2015 05/02/2015 04/29/2015 04/27/2015 04/27/2015 04/27/2015  Height 168 cm - - - - - -  Weight 88.587 kg 89.404 kg - 90.039 kg - - -  Weight (lbs) 195 lbs 5 oz 197 lbs 2 oz - 198 lbs 8 oz - - -  BMI (kg/m2) 31.52 kg/m2 - - - - - -  Temp 99.2 - 97 - - 97.6 -  Pulse 77 78 88 74 60 57 58  Resp $Rem'20 16 24 16 16 16 14  'JNRy$ SpO2 100 100 - 100 96 95 100  BSA (m2) 2.03 m2 - - - - - -   BP Readings from Last 3 Encounters:  05/09/15 144/78  05/06/15 128/80  05/02/15 131/69    Physical Exam  Constitutional: She is oriented to person, place, and time and well-developed, well-nourished, and in no distress.  HENT:  Head: Normocephalic and atraumatic.  Mouth/Throat: Oropharynx is clear and moist.  Eyes: Conjunctivae and EOM are normal. Pupils are equal, round, and reactive to light. Right eye exhibits no discharge. Left eye exhibits no discharge. No scleral icterus.  Neck: Normal range of motion.  Pulmonary/Chest: Effort normal. No respiratory distress.  Musculoskeletal: Normal range of motion. She exhibits edema. She exhibits no tenderness.  +1 edema to bilateral lower extremities; with the right leg slightly larger than the left.  No calf tenderness on exam.  Neurological: She is alert and oriented to person, place, and time.  Skin: Skin is warm and dry. No rash noted. No erythema. There is pallor.  Patient's left forearm extravasation site is healing; and patient's left dorsal side hand.  Skin continues to slough off.  There is new pink skin visible now to  both the hand and forearm.  There is no evidence of infection.  Psychiatric: Affect normal.  Nursing note and vitals reviewed.   LABORATORY DATA:. Appointment on 05/09/2015  Component Date Value Ref Range Status  . WBC 05/09/2015 1.6* 3.9 - 10.3 10e3/uL Final  . NEUT# 05/09/2015 1.1* 1.5 - 6.5 10e3/uL Final  . HGB 05/09/2015 9.5* 11.6 - 15.9 g/dL Final  . HCT 05/09/2015 28.4* 34.8 - 46.6 % Final  . Platelets 05/09/2015 77* 145 - 400 10e3/uL Final  . MCV 05/09/2015 94.9  79.5 - 101.0 fL Final  . MCH 05/09/2015 31.9  25.1 - 34.0 pg Final  . MCHC 05/09/2015 33.6  31.5 - 36.0 g/dL Final  . RBC 05/09/2015 2.99* 3.70 - 5.45 10e6/uL Final  . RDW 05/09/2015 21.6* 11.2 - 14.5 % Final  . lymph# 05/09/2015 0.2* 0.9 - 3.3 10e3/uL Final  . MONO# 05/09/2015 0.2  0.1 - 0.9 10e3/uL Final  . Eosinophils Absolute 05/09/2015 0.0  0.0 - 0.5 10e3/uL Final  . Basophils Absolute 05/09/2015 0.1  0.0 - 0.1 10e3/uL Final  . NEUT% 05/09/2015 67.8  38.4 - 76.8 % Final  . LYMPH% 05/09/2015 11.6* 14.0 - 49.7 % Final  . MONO% 05/09/2015 13.7  0.0 - 14.0 % Final  . EOS% 05/09/2015 3.0  0.0 - 7.0 % Final  . BASO% 05/09/2015 3.9* 0.0 - 2.0 % Final  . Sodium 05/09/2015 141  136 - 145 mEq/L Final  . Potassium 05/09/2015 4.2  3.5 - 5.1 mEq/L Final  . Chloride 05/09/2015 109  98 - 109 mEq/L Final  . CO2 05/09/2015 24  22 - 29 mEq/L Final  . Glucose 05/09/2015 115  70 - 140 mg/dl Final   Glucose reference range is for nonfasting patients. Fasting glucose reference range is 70- 100.  Marland Kitchen BUN 05/09/2015 27.5* 7.0 - 26.0 mg/dL Final  . Creatinine 05/09/2015 1.7* 0.6 - 1.1 mg/dL Final  . Total Bilirubin 05/09/2015 1.34* 0.20 - 1.20 mg/dL  Final  . Alkaline Phosphatase 05/09/2015 63  40 - 150 U/L Final  . AST 05/09/2015 13  5 - 34 U/L Final  . ALT 05/09/2015 9  0 - 55 U/L Final  . Total Protein 05/09/2015 5.3* 6.4 - 8.3 g/dL Final  . Albumin 05/09/2015 3.1* 3.5 - 5.0 g/dL Final  . Calcium 05/09/2015 8.9  8.4 - 10.4 mg/dL  Final  . Anion Gap 05/09/2015 7  3 - 11 mEq/L Final  . EGFR 05/09/2015 29* >90 ml/min/1.73 m2 Final   eGFR is calculated using the CKD-EPI Creatinine Equation (2009)   Left forearm:      RADIOGRAPHIC STUDIES: No results found.   Doppler US:   VASCULAR LAB PRELIMINARY PRELIMINARY PRELIMINARY PRELIMINARY  Bilateral lower extremity venous duplex completed.   Preliminary report: Right: DVT noted in the gastrocnemius vein and PTV. No evidence of superficial thrombosis. No Baker's cyst. Left: DVT noted in the gastrocnemius vein, PTV, and peroneal v. No evidence of superficial thrombosis. No Baker's cyst.  ASSESSMENT/PLAN:    Abrasions of multiple sites Pt has multiple healing abrasions to her right anterior shin, right forearm, and left forearm.  Patient states that she is constantly bumping into things.  There is no evidence of infection to these sites.  Antineoplastic chemotherapy induced pancytopenia [facility]) Patient had last week's chemotherapy held due to thrombocytopenia.  Patient presented to the [facility] today to receive her chemotherapy-and was noted once again to have low blood counts.  WBC is 1.6, ANC is 1.1, hemoglobin is 9.5, amplitude count has decreased from 88 down to 77.  Patient denies any worsening issues with either easy bleeding or bruising.  Patient also denies any recent fevers or chills.  Reviewed all neutropenia guidelines with both patient and her husband once again today.  After reviewing all findings with Dr. Webb Laws decision was made to hold chemotherapy today to allow her blood counts to recover.  Chronic renal insufficiency Patient received peripheral carboplatin/paclitaxel chemotherapy to her left forearm for cycle 2 of her treatment.  She presented approximately one week later with extravasation to the left forearm.  Patient states that she has completed all of her Bactrim antibiotics for treatment of any possible cellulitis associated  with the extravasation.  On exam today.-It does appear the patient's left forearm has essentially healed.  She continues with some significant sloughing of the skin to the left dorsal side of her hand.  There is no evidence of active infection.  Patient was advised to continue using Aquaphor to the sites; and to let us know if it does not continue to heal.  Patient was able to obtain a Port-A-Cath to the right upper chest on 04/27/2015 for any further chemotherapy infusions.          Hyperbilirubinemia Patient has a history of some chronic hyperbilirubinemia.  Bilirubin was 0.98.  Last week; and bilirubin today has increased to 1.34.  Patient denies any GI symptoms whatsoever.  The current time.  We'll continue to monitor closely.  Peripheral edema Patient has a history of CHF; and has some chronic bilateral lower extremity peripheral edema.  She also continues to take Coumadin per her primary care physician Dr. Thomes Dinning for treatment of atrial fib.  It was noted today that patient does have slightly increased edema to the right leg versus the left leg today.  She has no calf pain or tenderness.  She also denies any chest pain, chest pressure, shortness breath, or pain with inspiration.  Will obtain  a Doppler ultrasound today for further evaluation.  After obtaining a Doppler ultrasound results-will follow-up/review with Dr. Kipp Brood in regards to Coumadin checks.  Patient's last INR on October 19 was 1.1. _______________________________________________________________________________________________________________________________  Update: Doppler ultrasound obtained today did reveal a DVT to the right lower extremity.  Reviewed these findings with Dr. Julien Nordmann and Dr. Thomes Dinning primary care provider.  Dr. Kipp Brood prefers to switch the Coumadin to elevate request treatment.  New prescription will be given for the Eliquis.   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  Her chemotherapy was held last week due to thrombocytopenia.  Blood counts obtained today reveal a WBC of 1.6, ANC 1.1, hemoglobin 9.5, platelet count 77.  Decision was made to hold chemotherapy once again today due to thrombocytopenia.  Patient will also continue to receive radiation treatments as scheduled.  Patient is scheduled to return on Monday, 05/16/2015 for labs, visit, and her next cycle of chemotherapy.  Warfarin anticoagulation Patient continues to take Coumadin on a daily basis for treatment of her chronic atrial fib per her primary care physician Dr. Herbie Baltimore prior.  Last INR check care the cancer Center was 1.1.  It was noted on exam today, the patient's right leg was slightly larger than the left.  Patient is to undergo a Doppler ultrasound for further evaluation of the right leg edema today; and then we'll review our results with Dr. prior.  Patient has mentioned that it is very difficult for to get to her primary care provider's office for INR checks; and would prefer to have her blood drawn for her INR checks here at the cancer center.  She requests that these results be faxed or called to Dr. Lenord Carbo office so he can manage her Coumadin levels. ________________________________________________________________________  Update: Doppler ultrasound obtained today to the right lower extremity was positive for DVT.  After reviewing all findings with both Dr. Julien Nordmann and patient's primary care provider Dr. Suszanne Finch. Kipp Brood has made the decision to hold any further Coumadin; and to initiate Eliquis treatment for both chronic atrial fib, and newly diagnosed DVT.  Prescription for Eliquis will be given to the patient.    Pt was advised to discontinue Coumadin, Aspirin, and Ibuprofen.   Patient stated understanding of all instructions; and was in agreement with this plan of care. The patient knows to call  the clinic with any problems, questions or concerns.   This was a shared visit with Dr. Julien Nordmann today.  Total time spent with patient was 25 minutes;  with greater than 75 percent of that time spent in face to face counseling regarding patient's symptoms,  and coordination of care and follow up.  Disclaimer:This dictation was prepared with Dragon/digital dictation along with [employer]. Any transcriptional errors that result from this process are unintentional.  Drue Second, NP 05/09/2015   ADDENDUM: Hematology/Oncology Attending: I had a face to face encounter with the patient today. I recommended her care plan. This is a very pleasant 69 years old white female with stage IIIa non-small cell lung cancer currently undergoing a course of concurrent chemoradiation with weekly carboplatin and paclitaxel. She has been tolerating her treatment fairly well with no significant adverse effects except for extravasation and erythema on the left arm which is resolving. The patient also presented with swelling of the right lower extremity and she had Doppler of the lower extremities today that was consistent with the venous thrombosis. She is  currently on Coumadin but was not well-controlled. I recommended for the patient treatment with Eliquis for her deep venous thrombosis as well as the chronic atrial fibrillation after discussion with her primary care physician Dr. Kipp Brood. Her absolute neutrophil count and platelets count are low today. We will skip chemotherapy this week but the patient will continue with her radiotherapy as a scheduled. She would come back for follow-up visit as previously scheduled. The patient was advised to call immediately if she has any concerning symptoms in the interval.  Disclaimer: This note was dictated with voice recognition software. Similar sounding words can inadvertently be transcribed and may be missed upon review. Eilleen Kempf., MD 05/09/2015

## 2015-05-10 ENCOUNTER — Ambulatory Visit
Admission: RE | Admit: 2015-05-10 | Discharge: 2015-05-10 | Disposition: A | Discharge status: Home / Self Care | Payer: Medicare Other | Source: Ambulatory Visit | Attending: Radiation Oncology | Admitting: Radiation Oncology

## 2015-05-10 DIAGNOSIS — C3431 Malignant neoplasm of lower lobe, right bronchus or lung: Secondary | ICD-10-CM | POA: Diagnosis present

## 2015-05-10 DIAGNOSIS — Z510 Encounter for antineoplastic radiation therapy: Secondary | ICD-10-CM | POA: Diagnosis present

## 2015-05-10 DIAGNOSIS — R0602 Shortness of breath: Secondary | ICD-10-CM | POA: Diagnosis not present

## 2015-05-10 DIAGNOSIS — R05 Cough: Secondary | ICD-10-CM | POA: Diagnosis not present

## 2015-05-11 ENCOUNTER — Ambulatory Visit
Admission: RE | Admit: 2015-05-11 | Discharge: 2015-05-11 | Disposition: A | Discharge status: Home / Self Care | Payer: Medicare Other | Source: Ambulatory Visit | Attending: Radiation Oncology | Admitting: Radiation Oncology

## 2015-05-11 DIAGNOSIS — Z510 Encounter for antineoplastic radiation therapy: Secondary | ICD-10-CM | POA: Diagnosis not present

## 2015-05-12 ENCOUNTER — Ambulatory Visit
Admission: RE | Admit: 2015-05-12 | Discharge: 2015-05-12 | Disposition: A | Discharge status: Home / Self Care | Payer: Medicare Other | Source: Ambulatory Visit | Attending: Radiation Oncology | Admitting: Radiation Oncology

## 2015-05-12 DIAGNOSIS — Z510 Encounter for antineoplastic radiation therapy: Secondary | ICD-10-CM | POA: Diagnosis not present

## 2015-05-13 ENCOUNTER — Ambulatory Visit
Admission: RE | Admit: 2015-05-13 | Discharge: 2015-05-13 | Disposition: A | Discharge status: Home / Self Care | Payer: Medicare Other | Source: Ambulatory Visit | Attending: Radiation Oncology | Admitting: Radiation Oncology

## 2015-05-13 ENCOUNTER — Encounter: Payer: Self-pay | Admitting: Radiation Oncology

## 2015-05-13 VITALS — BP 162/110 | HR 70 | Resp 18 | Wt 203.3 lb

## 2015-05-13 DIAGNOSIS — C3431 Malignant neoplasm of lower lobe, right bronchus or lung: Secondary | ICD-10-CM

## 2015-05-13 DIAGNOSIS — Z510 Encounter for antineoplastic radiation therapy: Secondary | ICD-10-CM | POA: Diagnosis not present

## 2015-05-13 NOTE — Progress Notes (Signed)
BP elevated. Weight increased. Patient suffers from a fib. Reports difficulty swallowing continues despite using Carafate. No hyperpigmentation or desquamation noted within treatment field. However, linear bruised noted right breast within treatment field. Denies cough. Reports SOB in the morning. Reports fatigue.   BP 177/114 mmHg  Pulse 70  Resp 18  Wt 203 lb 4.8 oz (92.216 kg)  SpO2 100% Wt Readings from Last 3 Encounters:  05/13/15 203 lb 4.8 oz (92.216 kg)  05/09/15 195 lb 4.8 oz (88.587 kg)  05/06/15 197 lb 1.6 oz (89.404 kg)

## 2015-05-13 NOTE — Progress Notes (Signed)
  Radiation Oncology         [phone]   Name: KALILAH BARUA MRN: [id number]   Date: 05/13/2015  DOB: November 02, 1945     Weekly Radiation Therapy Management    ICD-9-CM ICD-10-CM   1. 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  BP elevated. Weight increased. Patient suffers from a fib. Reports difficulty swallowing continues despite using Carafate. No hyperpigmentation or desquamation noted within treatment field. However, linear bruised noted right breast within treatment field. Denies cough. Reports SOB in the morning. Reports fatigue. Reports waking up some mornings sick. Reports nasal mucous contains blood clots. Reports burning and itching in her genitalia.  The patient is without complaint. Set-up films were reviewed. The chart was checked.  Physical Findings  weight is 203 lb 4.8 oz (92.216 kg). Her blood pressure is 162/110 and her pulse is 70. Her respiration is 18 and oxygen saturation is 100%. . Weight essentially stable. Patient presents in wheelchair.  Impression The patient is tolerating radiation. She has vaginal candidiasis after antibiotics  Plan Continue treatment as planned. Advised for the patient to try saline nasal spray to alleviate bloody mucous. Diflucan prescription written today.      [name] Tammi Klippel, M.D.  This document serves as a record of services personally performed by Tyler Pita, MD. It was created on his behalf by Arlyce Harman, a trained medical scribe. The creation of this record is based on the scribe's personal observations and the provider's statements to them. This document has been checked and approved by the attending provider.

## 2015-05-13 NOTE — Progress Notes (Signed)
Patient suffers from a fib. BP checked manually remains high. Patient reports she failed to take bp medication this morning. Plans to take BP medication as soon as she arrives home.

## 2015-05-16 ENCOUNTER — Encounter: Payer: Self-pay | Admitting: Internal Medicine

## 2015-05-16 ENCOUNTER — Ambulatory Visit
Admission: RE | Admit: 2015-05-16 | Discharge: 2015-05-16 | Disposition: A | Discharge status: Home / Self Care | Payer: Medicare Other | Source: Ambulatory Visit | Attending: Radiation Oncology | Admitting: Radiation Oncology

## 2015-05-16 ENCOUNTER — Ambulatory Visit (HOSPITAL_BASED_OUTPATIENT_CLINIC_OR_DEPARTMENT_OTHER): Payer: Medicare Other | Admitting: Internal Medicine

## 2015-05-16 ENCOUNTER — Telehealth: Payer: Self-pay | Admitting: Internal Medicine

## 2015-05-16 ENCOUNTER — Ambulatory Visit (HOSPITAL_BASED_OUTPATIENT_CLINIC_OR_DEPARTMENT_OTHER): Payer: Medicare Other

## 2015-05-16 ENCOUNTER — Other Ambulatory Visit (HOSPITAL_BASED_OUTPATIENT_CLINIC_OR_DEPARTMENT_OTHER): Payer: Medicare Other

## 2015-05-16 VITALS — BP 115/58 | HR 58 | Temp 98.0°F | Resp 21 | Ht 66.0 in | Wt 203.0 lb

## 2015-05-16 DIAGNOSIS — Z5111 Encounter for antineoplastic chemotherapy: Secondary | ICD-10-CM

## 2015-05-16 DIAGNOSIS — C3431 Malignant neoplasm of lower lobe, right bronchus or lung: Secondary | ICD-10-CM | POA: Diagnosis present

## 2015-05-16 DIAGNOSIS — Z510 Encounter for antineoplastic radiation therapy: Secondary | ICD-10-CM | POA: Diagnosis not present

## 2015-05-16 DIAGNOSIS — D61818 Other pancytopenia: Secondary | ICD-10-CM

## 2015-05-16 LAB — COMPREHENSIVE METABOLIC PANEL (CC13)
ALBUMIN: 3 g/dL — AB (ref 3.5–5.0)
ALK PHOS: 57 U/L (ref 40–150)
ALT: 11 U/L (ref 0–55)
AST: 14 U/L (ref 5–34)
Anion Gap: 7 mEq/L (ref 3–11)
BUN: 24 mg/dL (ref 7.0–26.0)
CO2: 21 mEq/L — ABNORMAL LOW (ref 22–29)
Calcium: 9.2 mg/dL (ref 8.4–10.4)
Chloride: 111 mEq/L — ABNORMAL HIGH (ref 98–109)
Creatinine: 1.9 mg/dL — ABNORMAL HIGH (ref 0.6–1.1)
EGFR: 27 mL/min/{1.73_m2} — ABNORMAL LOW (ref >90)
GLUCOSE: 103 mg/dL (ref 70–140)
POTASSIUM: 4.6 meq/L (ref 3.5–5.1)
SODIUM: 140 meq/L (ref 136–145)
Total Bilirubin: 1.33 mg/dL — ABNORMAL HIGH (ref 0.20–1.20)
Total Protein: 5.2 g/dL — ABNORMAL LOW (ref 6.4–8.3)

## 2015-05-16 LAB — CBC WITH DIFFERENTIAL/PLATELET
BASO%: 0.9 % (ref 0.0–2.0)
BASOS ABS: 0 10*3/uL (ref 0.0–0.1)
EOS%: 4.6 % (ref 0.0–7.0)
Eosinophils Absolute: 0.1 10*3/uL (ref 0.0–0.5)
HCT: 27.9 % — ABNORMAL LOW (ref 34.8–46.6)
HEMOGLOBIN: 9.4 g/dL — AB (ref 11.6–15.9)
LYMPH%: 7 % — ABNORMAL LOW (ref 14.0–49.7)
MCH: 32.8 pg (ref 25.1–34.0)
MCHC: 33.5 g/dL (ref 31.5–36.0)
MCV: 97.7 fL (ref 79.5–101.0)
MONO#: 0.2 10*3/uL (ref 0.1–0.9)
MONO%: 8.8 % (ref 0.0–14.0)
NEUT%: 78.7 % — ABNORMAL HIGH (ref 38.4–76.8)
NEUTROS ABS: 2.2 10*3/uL (ref 1.5–6.5)
Platelets: 91 10*3/uL — ABNORMAL LOW (ref 145–400)
RBC: 2.86 10*6/uL — ABNORMAL LOW (ref 3.70–5.45)
RDW: 23.5 % — AB (ref 11.2–14.5)
WBC: 2.8 10*3/uL — AB (ref 3.9–10.3)
lymph#: 0.2 10*3/uL — ABNORMAL LOW (ref 0.9–3.3)

## 2015-05-16 MED ORDER — DIPHENHYDRAMINE HCL 50 MG/ML IJ SOLN
INTRAMUSCULAR | Status: AC
  Filled 2015-05-16: qty 1

## 2015-05-16 MED ORDER — SODIUM CHLORIDE 0.9 % IV SOLN
Freq: Once | INTRAVENOUS | Status: AC
  Administered 2015-05-16: 11:00:00 via INTRAVENOUS
  Filled 2015-05-16: qty 8

## 2015-05-16 MED ORDER — SODIUM CHLORIDE 0.9 % IJ SOLN
10.0000 mL | INTRAMUSCULAR | Status: DC | PRN
  Administered 2015-05-16: 10 mL
  Filled 2015-05-16: qty 10

## 2015-05-16 MED ORDER — PACLITAXEL CHEMO INJECTION 300 MG/50ML
45.0000 mg/m2 | Freq: Once | INTRAVENOUS | Status: AC
  Administered 2015-05-16: 90 mg via INTRAVENOUS
  Filled 2015-05-16: qty 15

## 2015-05-16 MED ORDER — HEPARIN SOD (PORK) LOCK FLUSH 100 UNIT/ML IV SOLN
500.0000 [IU] | Freq: Once | INTRAVENOUS | Status: AC | PRN
  Administered 2015-05-16: 500 [IU]
  Filled 2015-05-16: qty 5

## 2015-05-16 MED ORDER — SODIUM CHLORIDE 0.9 % IV SOLN
116.0000 mg | Freq: Once | INTRAVENOUS | Status: AC
  Administered 2015-05-16: 120 mg via INTRAVENOUS
  Filled 2015-05-16: qty 12

## 2015-05-16 MED ORDER — FAMOTIDINE IN NACL 20-0.9 MG/50ML-% IV SOLN
20.0000 mg | Freq: Once | INTRAVENOUS | Status: AC
  Administered 2015-05-16: 20 mg via INTRAVENOUS

## 2015-05-16 MED ORDER — FAMOTIDINE IN NACL 20-0.9 MG/50ML-% IV SOLN
INTRAVENOUS | Status: AC
  Filled 2015-05-16: qty 50

## 2015-05-16 MED ORDER — SODIUM CHLORIDE 0.9 % IV SOLN
Freq: Once | INTRAVENOUS | Status: AC
  Administered 2015-05-16: 11:00:00 via INTRAVENOUS

## 2015-05-16 MED ORDER — DIPHENHYDRAMINE HCL 50 MG/ML IJ SOLN
50.0000 mg | Freq: Once | INTRAMUSCULAR | Status: AC
  Administered 2015-05-16: 50 mg via INTRAVENOUS

## 2015-05-16 NOTE — Progress Notes (Signed)
All labs reviewed by MD- OK to treat

## 2015-05-16 NOTE — Progress Notes (Signed)
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 Patient diagnosed in September 2016, presented with right lower lobe lung mass in addition to mediastinal lymphadenopathy  PRIOR THERAPY: None  CURRENT THERAPY: Concurrent chemoradiation with weekly carboplatin for AUC of 2 and paclitaxel 45 MG/M2. Started on 04/04/15. Status post 4 cycles.  INTERVAL HISTORY: Kristy Kristy 69 y.o. female returns to the clinic today for follow-up visit accompanied by her husband. The patient is tolerating her treatment with concurrent chemoradiation fairly well except for mild pancytopenia. She also has extravasation of chemotherapy in the left forearm which has significantly improved compared to a few weeks ago. She had a Port-A-Cath placed on 04/27/2015. The patient denied having any significant swallowing issues. She has no chest pain but continues to have shortness of breath with exertion. She denied having any significant weight loss or night sweats. She is to start cycle #5 of her concurrent chemoradiation.  MEDICAL HISTORY: Past Medical History  Diagnosis Date  . Atrial fibrillation ([place]) 09/29/2008    Paroxysmal, amiodarone therapy with cardioversion to sinus rhythm in the past. / cardioversion on higher dose amiodarone successful October, 2010 / amiodarone stopped or pulmonary illness November, 2010, patient improved, amiodarone not restarted / regular rhythm January, 2011 / returned atrial fibrillation by March, 2011... plan rate control  . RENAL INSUFFICIENCY 09/29/2008    Creatinine 1.9  . VENTRICULAR HYPERTROPHY, LEFT 09/29/2008  . PULMONARY HYPERTENSION 09/29/2008  . ALLERGIC RHINITIS 09/29/2008  . HYPERCHOLESTEROLEMIA 09/29/2008  . ANXIETY DEPRESSION 09/29/2008  . OBESITY 09/29/2008    . GERD 09/29/2008  . HYPERTENSION 09/29/2008    No echo data as of April, 2011 ( normal LV function by history)  . PEPTIC ULCER DISEASE 09/29/2008  . THYROTOXICOSIS 09/29/2008  . CHEST DISCOMFORT 05/30/2009    In October,2010  ,??GI??  . CAD 10/20/2008    Catheterization [facility] 2001, 70% circumflex / nuclear April, 2010 no ischemia, not gated with atrial fib  . Cough 04/12/2009    Her lisinopril held October, 2010, cough resolved  . Warfarin anticoagulation   . Peptic ulcer disease   . Fluid overload     Improvement with Zaroxolyn July, 2011  . Shortness of breath     Amiodarone will not be restarted  /     Pulmonary illness November, 2010, abnormal CT, sedimentation rate not elevated, Dr Melvyn Novas saw patient,, no desaturation walking April, 2011, plan to follow  . Chronic diastolic CHF (congestive heart failure) ([place])   . Ejection fraction   . Cancer ([place])     Lung Cancer    ALLERGIES:  is allergic to penicillins; tramadol; amiodarone; atorvastatin; bystolic; cholestyramine; clonidine hydrochloride; lansoprazole; oxycodone-acetaminophen; prevalite; propoxyphene hcl; propoxyphene n-acetaminophen; and aspirin.  MEDICATIONS:  Current Outpatient Prescriptions  Medication Sig Dispense Refill  . albuterol (PROVENTIL HFA;VENTOLIN HFA) 108 (90 BASE) MCG/ACT inhaler Inhale 2 puffs into the lungs every 6 (six) hours as needed for wheezing or shortness of breath.    . allopurinol (ZYLOPRIM) 300 MG tablet Take 300 mg by mouth every morning.     Marland Kitchen apixaban (ELIQUIS) 5 MG TABS tablet Take 10 mg (2 tabs) PO BID x 7 days; then decrease to 5 mg (1 tab) PO BID. 60 tablet 1  . atenolol (TENORMIN) 100 MG tablet Take  100 mg by mouth every morning.     . chlorhexidine (PERIDEX) 0.12 % solution Rinse with 1/2 ounce by mouth for 30 seconds then spit out. use twice a day as needed for tooth issues  0  . diltiazem (CARDIZEM CD) 240 MG 24 hr capsule Take 1 capsule (240 mg total) by mouth daily. 90 capsule 3  .  docusate sodium (COLACE) 100 MG capsule Take 100 mg by mouth daily as needed for mild constipation.    . furosemide (LASIX) 80 MG tablet Take 40 mg by mouth every morning.   0  . HYDROcodone-acetaminophen (NORCO/VICODIN) 5-325 MG per tablet Take 1 tablet by mouth every 6 (six) hours as needed for moderate pain.    Marland Kitchen latanoprost (XALATAN) 0.005 % ophthalmic solution Place 1 drop into both eyes at bedtime.    Marland Kitchen levothyroxine (SYNTHROID, LEVOTHROID) 150 MCG tablet Take 150 mcg by mouth every morning.     . lidocaine-prilocaine (EMLA) cream Apply to port 1-2 hours before accessing 30 g 1  . lisinopril (PRINIVIL,ZESTRIL) 40 MG tablet Take 40 mg by mouth every morning.     Marland Kitchen LORazepam (ATIVAN) 0.5 MG tablet Take 0.5 mg by mouth at bedtime as needed for anxiety or sleep.     . minoxidil (LONITEN) 2.5 MG tablet Take 1 tablet by mouth every morning.     Marland Kitchen omeprazole (PRILOSEC) 20 MG capsule Take 20 mg by mouth every morning.     . prochlorperazine (COMPAZINE) 10 MG tablet Take 1 tablet (10 mg total) by mouth every 6 (six) hours as needed for nausea or vomiting. 30 tablet 0  . ranitidine (ZANTAC) 150 MG tablet     . sucralfate (CARAFATE) 1 G tablet Take 1 tablet (1 g total) by mouth 4 (four) times daily. 120 tablet 2  . sulfamethoxazole-trimethoprim (BACTRIM DS,SEPTRA DS) 800-160 MG tablet Take 1 tablet by mouth 2 (two) times daily. 20 tablet 0  . traMADol (ULTRAM) 50 MG tablet Take 1 tablet (50 mg total) by mouth every 6 (six) hours as needed. (Patient taking differently: Take 50 mg by mouth every 6 (six) hours as needed for moderate pain. ) 30 tablet 0  . traZODone (DESYREL) 50 MG tablet Take 1 tablet by mouth at bedtime as needed for sleep.     Marland Kitchen warfarin (COUMADIN) 5 MG tablet take 1 tablet by mouth as directed by prescriber PER INR RESULTS  0  . Wound Cleansers (RADIAPLEX EX) Apply 1 application topically daily as needed (irritaiton on hands/arm).      No current facility-administered medications for  this visit.    SURGICAL HISTORY:  Past Surgical History  Procedure Laterality Date  . Tonsillectomy and adenoidectomy  1953  . Dilation and curettage of uterus  2004    x2  . Hernia repair  2003    REVIEW OF SYSTEMS:  A comprehensive review of systems was negative except for: Constitutional: positive for fatigue Respiratory: positive for dyspnea on exertion   PHYSICAL EXAMINATION: General appearance: alert, cooperative, fatigued and no distress Head: Normocephalic, without obvious abnormality, atraumatic Neck: no adenopathy, no JVD, supple, symmetrical, trachea midline and thyroid not enlarged, symmetric, no tenderness/mass/nodules Lymph nodes: Cervical, supraclavicular, and axillary nodes normal. Resp: clear to auscultation bilaterally Back: symmetric, no curvature. ROM normal. No CVA tenderness. Cardio: regular rate and rhythm, S1, S2 normal, no murmur, click, rub or gallop GI: soft, non-tender; bowel sounds normal; no masses,  no organomegaly Extremities: extremities normal, atraumatic, no cyanosis or edema  ECOG PERFORMANCE  STATUS: 2 - Symptomatic, <50% confined to bed  Blood pressure 115/58, pulse 58, temperature 98 F (36.7 C), temperature source Oral, resp. rate 21, height $RemoveBe'5\' 6"'BaWExwrcE$  (1.676 m), weight 203 lb (92.08 kg), SpO2 100 %.  LABORATORY DATA: Lab Results  Component Value Date   WBC 2.8* 05/16/2015   HGB 9.4* 05/16/2015   HCT 27.9* 05/16/2015   MCV 97.7 05/16/2015   PLT 91* 05/16/2015      Chemistry      Component Value Date/Time   NA 141 05/09/2015 0842   NA 132* 03/09/2015 0700   K 4.2 05/09/2015 0842   K 4.4 03/09/2015 0700   CL 99* 03/09/2015 0700   CO2 24 05/09/2015 0842   CO2 24 03/09/2015 0700   BUN 27.5* 05/09/2015 0842   BUN 33* 03/09/2015 0700   CREATININE 1.7* 05/09/2015 0842   CREATININE 1.50* 03/09/2015 0700      Component Value Date/Time   CALCIUM 8.9 05/09/2015 0842   CALCIUM 9.6 03/09/2015 0700   ALKPHOS 63 05/09/2015 0842   AST 13  05/09/2015 0842   ALT 9 05/09/2015 0842   BILITOT 1.34* 05/09/2015 0842       RADIOGRAPHIC STUDIES: Ir Fluoro Guide Cv Line Right  04/27/2015  INDICATION: History of lung cancer. In need of durable intravenous access for chemotherapy administration. EXAM: IMPLANTED PORT A CATH PLACEMENT WITH ULTRASOUND AND FLUOROSCOPIC GUIDANCE COMPARISON:  PET-CT - 03/08/2015 MEDICATIONS: Vancomycin 1 gm IV; The antibiotic was administered within an appropriate time interval prior to skin puncture. ANESTHESIA/SEDATION: Versed 1.5 mg IV; Fentanyl 50 mcg IV; Total Moderate Sedation Time 27  minutes. CONTRAST:  None FLUOROSCOPY TIME:  30 seconds (10 mGy) COMPLICATIONS: None immediate PROCEDURE: The procedure, risks, benefits, and alternatives were explained to the patient. Questions regarding the procedure were encouraged and answered. The patient understands and consents to the procedure. The right neck and chest were prepped with chlorhexidine in a sterile fashion, and a sterile drape was applied covering the operative field. Maximum barrier sterile technique with sterile gowns and gloves were used for the procedure. A timeout was performed prior to the initiation of the procedure. Local anesthesia was provided with 1% lidocaine with epinephrine. After creating a small venotomy incision, a micropuncture kit was utilized to access the internal jugular vein under direct, real-time ultrasound guidance. Ultrasound image documentation was performed. The microwire was kinked to measure appropriate catheter length. A subcutaneous port pocket was then created along the upper chest wall utilizing a combination of sharp and blunt dissection. The pocket was irrigated with sterile saline. A single lumen ISP power injectable port was chosen for placement. The 8 Fr catheter was tunneled from the port pocket site to the venotomy incision. The port was placed in the pocket. The external catheter was trimmed to appropriate length. At the  venotomy, an 8 Fr peel-away sheath was placed over a guidewire under fluoroscopic guidance. The catheter was then placed through the sheath and the sheath was removed. Final catheter positioning was confirmed and documented with a fluoroscopic spot radiograph. The port was accessed with a Huber needle, aspirated and flushed with heparinized saline. The venotomy site was closed with an interrupted 4-0 Vicryl suture. The port pocket incision was closed with interrupted 2-0 Vicryl suture and the skin was opposed with a running subcuticular 4-0 Vicryl suture. Dermabond and Steri-strips were applied to both incisions. Dressings were placed. The patient tolerated the procedure well without immediate post procedural complication. FINDINGS: After catheter placement, the tip lies within  the superior cavoatrial junction. The catheter aspirates and flushes normally and is ready for immediate use. IMPRESSION: Successful placement of a right internal jugular approach power injectable Port-A-Cath. The catheter is ready for immediate use. Electronically Signed   By: Sandi Mariscal M.D.   On: 04/27/2015 16:00   Ir US Guide Vasc Access Right  04/27/2015  INDICATION: History of lung cancer. In need of durable intravenous access for chemotherapy administration. EXAM: IMPLANTED PORT A CATH PLACEMENT WITH ULTRASOUND AND FLUOROSCOPIC GUIDANCE COMPARISON:  PET-CT - 03/08/2015 MEDICATIONS: Vancomycin 1 gm IV; The antibiotic was administered within an appropriate time interval prior to skin puncture. ANESTHESIA/SEDATION: Versed 1.5 mg IV; Fentanyl 50 mcg IV; Total Moderate Sedation Time 27  minutes. CONTRAST:  None FLUOROSCOPY TIME:  30 seconds (10 mGy) COMPLICATIONS: None immediate PROCEDURE: The procedure, risks, benefits, and alternatives were explained to the patient. Questions regarding the procedure were encouraged and answered. The patient understands and consents to the procedure. The right neck and chest were prepped with  chlorhexidine in a sterile fashion, and a sterile drape was applied covering the operative field. Maximum barrier sterile technique with sterile gowns and gloves were used for the procedure. A timeout was performed prior to the initiation of the procedure. Local anesthesia was provided with 1% lidocaine with epinephrine. After creating a small venotomy incision, a micropuncture kit was utilized to access the internal jugular vein under direct, real-time ultrasound guidance. Ultrasound image documentation was performed. The microwire was kinked to measure appropriate catheter length. A subcutaneous port pocket was then created along the upper chest wall utilizing a combination of sharp and blunt dissection. The pocket was irrigated with sterile saline. A single lumen ISP power injectable port was chosen for placement. The 8 Fr catheter was tunneled from the port pocket site to the venotomy incision. The port was placed in the pocket. The external catheter was trimmed to appropriate length. At the venotomy, an 8 Fr peel-away sheath was placed over a guidewire under fluoroscopic guidance. The catheter was then placed through the sheath and the sheath was removed. Final catheter positioning was confirmed and documented with a fluoroscopic spot radiograph. The port was accessed with a Huber needle, aspirated and flushed with heparinized saline. The venotomy site was closed with an interrupted 4-0 Vicryl suture. The port pocket incision was closed with interrupted 2-0 Vicryl suture and the skin was opposed with a running subcuticular 4-0 Vicryl suture. Dermabond and Steri-strips were applied to both incisions. Dressings were placed. The patient tolerated the procedure well without immediate post procedural complication. FINDINGS: After catheter placement, the tip lies within the superior cavoatrial junction. The catheter aspirates and flushes normally and is ready for immediate use. IMPRESSION: Successful placement of a  right internal jugular approach power injectable Port-A-Cath. The catheter is ready for immediate use. Electronically Signed   By: Sandi Mariscal M.D.   On: 04/27/2015 16:00    ASSESSMENT AND PLAN: This is a very pleasant 69 years old white female with a stage IIIa non-small cell lung cancer currently undergoing a course of concurrent chemoradiation with weekly carboplatin and paclitaxel is status post 4 cycles. The patient is tolerating her treatment well except for mild pancytopenia. Her platelets count 91,000 today but I will proceed with cycle #5 as a scheduled. She will come back for follow-up visit in 2 weeks for reevaluation and management of any adverse effect of her treatment. The patient was advised to call immediately if she has any concerning symptoms in the interval.  The patient voices understanding of current disease status and treatment options and is in agreement with the current care plan.  All questions were answered. The patient knows to call the clinic with any problems, questions or concerns. We can certainly see the patient much sooner if necessary.  Disclaimer: This note was dictated with voice recognition software. Similar sounding words can inadvertently be transcribed and may not be corrected upon review.

## 2015-05-16 NOTE — Patient Instructions (Signed)
[facility] Discharge Instructions for Patients Receiving Chemotherapy  Today you received the following chemotherapy agents: taxol, carboplatin  To help prevent nausea and vomiting after your treatment, we encourage you to take your nausea medication.  Take it as often as prescribed.     If you develop nausea and vomiting that is not controlled by your nausea medication, call the clinic. If it is after clinic hours your family physician or the after hours number for the clinic or go to the Emergency Department.   BELOW ARE SYMPTOMS THAT SHOULD BE REPORTED IMMEDIATELY:  *FEVER GREATER THAN 100.5 F  *CHILLS WITH OR WITHOUT FEVER  NAUSEA AND VOMITING THAT IS NOT CONTROLLED WITH YOUR NAUSEA MEDICATION  *UNUSUAL SHORTNESS OF BREATH  *UNUSUAL BRUISING OR BLEEDING  TENDERNESS IN MOUTH AND THROAT WITH OR WITHOUT PRESENCE OF ULCERS  *URINARY PROBLEMS  *BOWEL PROBLEMS  UNUSUAL RASH Items with * indicate a potential emergency and should be followed up as soon as possible.  Feel free to call the clinic you have any questions or concerns. The clinic phone number is [phone].   I have been informed and understand all the instructions given to me. I know to contact the clinic, my physician, or go to the Emergency Department if any problems should occur. I do not have any questions at this time, but understand that I may call the clinic during office hours   should I have any questions or need assistance in obtaining follow up care.    __________________________________________  _____________  __________ Signature of Patient or Authorized Representative            Date                   Time    __________________________________________ Nurse's Signature    

## 2015-05-16 NOTE — Telephone Encounter (Signed)
Gave patient avs report and appointments for November.  °

## 2015-05-16 NOTE — Progress Notes (Signed)
Per MD office note dtd 05/16/15, ok to treat with platelets 91.   Per Dr. Julien Nordmann, ok to treat with Creatinine 1.9.

## 2015-05-17 ENCOUNTER — Ambulatory Visit
Admission: RE | Admit: 2015-05-17 | Discharge: 2015-05-17 | Disposition: A | Discharge status: Home / Self Care | Payer: Medicare Other | Source: Ambulatory Visit | Attending: Radiation Oncology | Admitting: Radiation Oncology

## 2015-05-17 DIAGNOSIS — Z510 Encounter for antineoplastic radiation therapy: Secondary | ICD-10-CM | POA: Diagnosis not present

## 2015-05-18 ENCOUNTER — Ambulatory Visit
Admission: RE | Admit: 2015-05-18 | Discharge: 2015-05-18 | Disposition: A | Discharge status: Home / Self Care | Payer: Medicare Other | Source: Ambulatory Visit | Attending: Radiation Oncology | Admitting: Radiation Oncology

## 2015-05-18 DIAGNOSIS — Z510 Encounter for antineoplastic radiation therapy: Secondary | ICD-10-CM | POA: Diagnosis not present

## 2015-05-19 ENCOUNTER — Encounter: Payer: Self-pay | Admitting: Radiation Oncology

## 2015-05-19 ENCOUNTER — Ambulatory Visit
Admission: RE | Admit: 2015-05-19 | Discharge: 2015-05-19 | Disposition: A | Discharge status: Home / Self Care | Payer: Medicare Other | Source: Ambulatory Visit | Attending: Radiation Oncology | Admitting: Radiation Oncology

## 2015-05-19 VITALS — BP 172/94 | HR 50 | Temp 98.1°F | Resp 20 | Wt 206.9 lb

## 2015-05-19 DIAGNOSIS — C3431 Malignant neoplasm of lower lobe, right bronchus or lung: Secondary | ICD-10-CM

## 2015-05-19 DIAGNOSIS — Z510 Encounter for antineoplastic radiation therapy: Secondary | ICD-10-CM | POA: Diagnosis not present

## 2015-05-19 MED ORDER — ACYCLOVIR 200 MG PO CAPS: 200.0000 mg | ORAL_CAPSULE | Freq: Every day | ORAL | Status: DC

## 2015-05-19 NOTE — Progress Notes (Signed)
Informed Kristy [name], RT on L3 that the patient has shingles. Advised therapist to wear gloves, wash hands, and clean equipment with orange top wipes.

## 2015-05-19 NOTE — Addendum Note (Signed)
Encounter addended by: Heywood Footman, RN on: 05/19/2015  1:51 PM<BR>     Documentation filed: Notes Section

## 2015-05-19 NOTE — Progress Notes (Signed)
  Radiation Oncology         [phone]   Name: Kristy [name] MRN: [id number]   Date: 05/19/2015  DOB: August 11, 1945     Weekly Radiation Therapy Management    ICD-9-CM ICD-10-CM   1. squamous cell Cancer of lower lobe of right lung 162.5 C34.31     Current Dose: 48 Gy  Planned Dose:  66 Gy  Narrative The patient presents for routine under treatment assessment.  Weekly radiation Lung 24/33 completed. Coughing up yellow bile. Nauseated this morning since 1am. Hasn't taken her compazine as yet. Looks like shingles on mid left back. No c/o pain though. Chest mild erythema using cream prn. Will take her compazine when she gets home. Last bowel movement 2 days ago. Fatigued. She has had shingles in the past but does not recall which medication she was previously given. Reports more pain on the right by her shoulder blade.  The patient is without complaint. Set-up films were reviewed. The chart was checked.  Physical Findings  weight is 206 lb 14.4 oz (93.849 kg). Her oral temperature is 98.1 F (36.7 C). Her blood pressure is 172/94 and her pulse is 50. Her respiration is 20. . Weight essentially stable. Patient presents in wheelchair. Patient has shingles on the left lower back   Impression The patient is tolerating radiation.   Plan Continue treatment as planned. I have written her a prescription for acyclovir 200 mg 5x daily for 10 days to resolve her shingles.      [name] Tammi Klippel, M.D.  This document serves as a record of services personally performed by Tyler Pita, MD. It was created on his behalf by Arlyce Harman, a trained medical scribe. The creation of this record is based on the scribe's personal observations and the provider's statements to them. This document has been checked and approved by the attending provider.

## 2015-05-19 NOTE — Progress Notes (Signed)
Weekly radiation  Lung 24/33 completed, coughing up yellow bile, nauseated this morning since 1am, hasn't taken her compazine as yet, looks like shingles on mid left back, no c/o pain though,  Chest mild erythema using cream prn, will take her compazine when she gets home,  Last bowel movement  2 days ago, fatigued 10:54 AM BP 172/94 mmHg  Pulse 50  Temp(Src) 98.1 F (36.7 C) (Oral)  Resp 20  Wt 206 lb 14.4 oz (93.849 kg)  Wt Readings from Last 3 Encounters:  05/19/15 206 lb 14.4 oz (93.849 kg)  05/16/15 203 lb (92.08 kg)  05/13/15 203 lb 4.8 oz (92.216 kg)

## 2015-05-20 ENCOUNTER — Ambulatory Visit
Admission: RE | Admit: 2015-05-20 | Discharge: 2015-05-20 | Disposition: A | Discharge status: Home / Self Care | Payer: Medicare Other | Source: Ambulatory Visit | Attending: Radiation Oncology | Admitting: Radiation Oncology

## 2015-05-20 DIAGNOSIS — Z510 Encounter for antineoplastic radiation therapy: Secondary | ICD-10-CM | POA: Diagnosis not present

## 2015-05-23 ENCOUNTER — Ambulatory Visit
Admission: RE | Admit: 2015-05-23 | Discharge: 2015-05-23 | Disposition: A | Discharge status: Home / Self Care | Payer: Medicare Other | Source: Ambulatory Visit | Attending: Radiation Oncology | Admitting: Radiation Oncology

## 2015-05-23 ENCOUNTER — Ambulatory Visit (HOSPITAL_BASED_OUTPATIENT_CLINIC_OR_DEPARTMENT_OTHER): Payer: Medicare Other

## 2015-05-23 ENCOUNTER — Other Ambulatory Visit: Payer: Self-pay | Admitting: Medical Oncology

## 2015-05-23 ENCOUNTER — Other Ambulatory Visit (HOSPITAL_BASED_OUTPATIENT_CLINIC_OR_DEPARTMENT_OTHER): Payer: Medicare Other

## 2015-05-23 VITALS — BP 154/93 | HR 105 | Temp 97.8°F | Resp 20

## 2015-05-23 DIAGNOSIS — C3431 Malignant neoplasm of lower lobe, right bronchus or lung: Secondary | ICD-10-CM

## 2015-05-23 DIAGNOSIS — Z510 Encounter for antineoplastic radiation therapy: Secondary | ICD-10-CM | POA: Diagnosis not present

## 2015-05-23 LAB — CBC WITH DIFFERENTIAL/PLATELET
BASO%: 0.5 % (ref 0.0–2.0)
BASOS ABS: 0 10*3/uL (ref 0.0–0.1)
EOS%: 2.4 % (ref 0.0–7.0)
Eosinophils Absolute: 0.1 10*3/uL (ref 0.0–0.5)
HCT: 27 % — ABNORMAL LOW (ref 34.8–46.6)
HEMOGLOBIN: 9.3 g/dL — AB (ref 11.6–15.9)
LYMPH%: 15 % (ref 14.0–49.7)
MCH: 33 pg (ref 25.1–34.0)
MCHC: 34.4 g/dL (ref 31.5–36.0)
MCV: 95.7 fL (ref 79.5–101.0)
MONO#: 0.1 10*3/uL (ref 0.1–0.9)
MONO%: 5.8 % (ref 0.0–14.0)
NEUT#: 1.6 10*3/uL (ref 1.5–6.5)
NEUT%: 76.3 % (ref 38.4–76.8)
Platelets: 122 10*3/uL — ABNORMAL LOW (ref 145–400)
RBC: 2.82 10*6/uL — ABNORMAL LOW (ref 3.70–5.45)
RDW: 19.8 % — AB (ref 11.2–14.5)
WBC: 2.1 10*3/uL — AB (ref 3.9–10.3)
lymph#: 0.3 10*3/uL — ABNORMAL LOW (ref 0.9–3.3)

## 2015-05-23 LAB — COMPREHENSIVE METABOLIC PANEL (CC13)
ALBUMIN: 3.3 g/dL — AB (ref 3.5–5.0)
ALT: 10 U/L (ref 0–55)
AST: 13 U/L (ref 5–34)
Alkaline Phosphatase: 55 U/L (ref 40–150)
Anion Gap: 8 mEq/L (ref 3–11)
BUN: 20.7 mg/dL (ref 7.0–26.0)
CHLORIDE: 108 meq/L (ref 98–109)
CO2: 23 meq/L (ref 22–29)
Calcium: 9 mg/dL (ref 8.4–10.4)
Creatinine: 1.6 mg/dL — ABNORMAL HIGH (ref 0.6–1.1)
EGFR: 32 mL/min/{1.73_m2} — ABNORMAL LOW (ref >90)
GLUCOSE: 93 mg/dL (ref 70–140)
POTASSIUM: 3.8 meq/L (ref 3.5–5.1)
SODIUM: 139 meq/L (ref 136–145)
Total Bilirubin: 2.26 mg/dL — ABNORMAL HIGH (ref 0.20–1.20)
Total Protein: 5.5 g/dL — ABNORMAL LOW (ref 6.4–8.3)

## 2015-05-23 MED ORDER — SODIUM CHLORIDE 0.9 % IJ SOLN
10.0000 mL | INTRAMUSCULAR | Status: DC | PRN
  Administered 2015-05-23: 10 mL
  Filled 2015-05-23: qty 10

## 2015-05-23 MED ORDER — HEPARIN SOD (PORK) LOCK FLUSH 100 UNIT/ML IV SOLN
500.0000 [IU] | Freq: Once | INTRAVENOUS | Status: AC | PRN
  Administered 2015-05-23: 500 [IU]
  Filled 2015-05-23: qty 5

## 2015-05-23 MED ORDER — DIPHENHYDRAMINE HCL 50 MG/ML IJ SOLN
INTRAMUSCULAR | Status: AC
  Filled 2015-05-23: qty 1

## 2015-05-23 MED ORDER — FAMOTIDINE IN NACL 20-0.9 MG/50ML-% IV SOLN
INTRAVENOUS | Status: AC
  Filled 2015-05-23: qty 50

## 2015-05-23 NOTE — Progress Notes (Signed)
Pt labs reviewed by Dr. Julien Nordmann and will cancel treatment for today (taxol/ [place]) 05/23/2015. Will follow up with pt labs and dr appt next Monday. Pt aware and understands plan. Printed out pt schedule for next appt. Pt port deaccessed appropriately.

## 2015-05-24 ENCOUNTER — Ambulatory Visit
Admission: RE | Admit: 2015-05-24 | Discharge: 2015-05-24 | Disposition: A | Discharge status: Home / Self Care | Payer: Medicare Other | Source: Ambulatory Visit | Attending: Radiation Oncology | Admitting: Radiation Oncology

## 2015-05-24 DIAGNOSIS — Z510 Encounter for antineoplastic radiation therapy: Secondary | ICD-10-CM | POA: Diagnosis not present

## 2015-05-25 ENCOUNTER — Ambulatory Visit
Admission: RE | Admit: 2015-05-25 | Discharge: 2015-05-25 | Disposition: A | Discharge status: Home / Self Care | Payer: Medicare Other | Source: Ambulatory Visit | Attending: Radiation Oncology | Admitting: Radiation Oncology

## 2015-05-25 DIAGNOSIS — Z510 Encounter for antineoplastic radiation therapy: Secondary | ICD-10-CM | POA: Diagnosis not present

## 2015-05-26 ENCOUNTER — Ambulatory Visit
Admission: RE | Admit: 2015-05-26 | Discharge: 2015-05-26 | Disposition: A | Discharge status: Home / Self Care | Payer: Medicare Other | Source: Ambulatory Visit | Attending: Radiation Oncology | Admitting: Radiation Oncology

## 2015-05-26 DIAGNOSIS — Z510 Encounter for antineoplastic radiation therapy: Secondary | ICD-10-CM | POA: Diagnosis not present

## 2015-05-27 ENCOUNTER — Ambulatory Visit
Admission: RE | Admit: 2015-05-27 | Discharge: 2015-05-27 | Disposition: A | Discharge status: Home / Self Care | Payer: Medicare Other | Source: Ambulatory Visit | Attending: Radiation Oncology | Admitting: Radiation Oncology

## 2015-05-27 ENCOUNTER — Encounter: Payer: Self-pay | Admitting: Radiation Oncology

## 2015-05-27 VITALS — BP 122/104 | HR 101 | Temp 97.9°F | Resp 20 | Wt 201.3 lb

## 2015-05-27 DIAGNOSIS — C3431 Malignant neoplasm of lower lobe, right bronchus or lung: Secondary | ICD-10-CM

## 2015-05-27 DIAGNOSIS — Z510 Encounter for antineoplastic radiation therapy: Secondary | ICD-10-CM | POA: Diagnosis not present

## 2015-05-27 NOTE — Progress Notes (Signed)
  Radiation Oncology         [phone]   Name: Kristy [name] MRN: [id number]   Date: 05/27/2015  DOB: 1946/06/18     Weekly Radiation Therapy Management    ICD-9-CM ICD-10-CM   1. squamous cell Cancer of lower lobe of right lung 162.5 C34.31     Current Dose: Reviewed  Planned Dose:  66 Gy  Narrative The patient presents for routine under treatment assessment.  Has a slight headache. b/p is up. No coughing. Appetite good. Some difficulty swallowing liquids stated she has to drink slow, suggested maybe try sipping through a straw. In w/c. Fatigued. No pain will get Tx this Sunday and completes next Tuesday.  The patient is without complaint. Set-up films were reviewed. The chart was checked.  Physical Findings  weight is 201 lb 4.8 oz (91.309 kg). Her oral temperature is 97.9 F (36.6 C). Her blood pressure is 122/104 and her pulse is 101. Her respiration is 20 and oxygen saturation is 97%. . Weight essentially stable. Patient presents in wheelchair. BP elevated.  Impression The patient is tolerating radiation.   Plan Continue treatment as planned. BP managed by PCP. She will complete treatment next week and will follow up in one month.      [name] Tammi Klippel, M.D.  This document serves as a record of services personally performed by Tyler Pita, MD. It was created on his behalf by Arlyce Harman, a trained medical scribe. The creation of this record is based on the scribe's personal observations and the provider's statements to them. This document has been checked and approved by the attending provider.

## 2015-05-27 NOTE — Progress Notes (Signed)
weekly rad xts Lung 30/33 completed,  Has a slight head ache, b/p is up, no coughing, appetite good, some difficulty swallowing liquids stated she has to  Drink slow, sugessted maybe try sipping through a straw, in w/c, fatigued, no pain will get Tx this Sunday and completes next Tuesday BP 122/104 mmHg  Pulse 101  Temp(Src) 97.9 F (36.6 C) (Oral)  Resp 20  Wt 201 lb 4.8 oz (91.309 kg)  SpO2 97%  Wt Readings from Last 3 Encounters:  05/27/15 201 lb 4.8 oz (91.309 kg)  05/19/15 206 lb 14.4 oz (93.849 kg)  05/16/15 203 lb (92.08 kg)   10:43 AM +

## 2015-05-29 ENCOUNTER — Ambulatory Visit
Admission: RE | Admit: 2015-05-29 | Discharge: 2015-05-29 | Disposition: A | Discharge status: Home / Self Care | Payer: Medicare Other | Source: Ambulatory Visit | Attending: Radiation Oncology | Admitting: Radiation Oncology

## 2015-05-29 DIAGNOSIS — Z510 Encounter for antineoplastic radiation therapy: Secondary | ICD-10-CM | POA: Diagnosis not present

## 2015-05-30 ENCOUNTER — Telehealth: Payer: Self-pay | Admitting: *Deleted

## 2015-05-30 ENCOUNTER — Telehealth: Payer: Self-pay | Admitting: Physician Assistant

## 2015-05-30 ENCOUNTER — Other Ambulatory Visit (HOSPITAL_BASED_OUTPATIENT_CLINIC_OR_DEPARTMENT_OTHER): Payer: Medicare Other

## 2015-05-30 ENCOUNTER — Ambulatory Visit (HOSPITAL_BASED_OUTPATIENT_CLINIC_OR_DEPARTMENT_OTHER): Payer: Medicare Other | Admitting: Physician Assistant

## 2015-05-30 ENCOUNTER — Ambulatory Visit
Admission: RE | Admit: 2015-05-30 | Discharge: 2015-05-30 | Disposition: A | Discharge status: Home / Self Care | Payer: Medicare Other | Source: Ambulatory Visit | Attending: Radiation Oncology | Admitting: Radiation Oncology

## 2015-05-30 ENCOUNTER — Ambulatory Visit: Payer: Medicare Other

## 2015-05-30 VITALS — BP 154/84 | HR 98 | Temp 97.5°F | Resp 18 | Ht 66.0 in | Wt 202.6 lb

## 2015-05-30 DIAGNOSIS — C3431 Malignant neoplasm of lower lobe, right bronchus or lung: Secondary | ICD-10-CM

## 2015-05-30 DIAGNOSIS — Z510 Encounter for antineoplastic radiation therapy: Secondary | ICD-10-CM | POA: Diagnosis not present

## 2015-05-30 LAB — COMPREHENSIVE METABOLIC PANEL (CC13)
ALT: <9 U/L (ref 0–55)
AST: 12 U/L (ref 5–34)
Albumin: 3.3 g/dL — ABNORMAL LOW (ref 3.5–5.0)
Alkaline Phosphatase: 57 U/L (ref 40–150)
Anion Gap: 8 meq/L (ref 3–11)
BUN: 19.1 mg/dL (ref 7.0–26.0)
CO2: 23 meq/L (ref 22–29)
Calcium: 9.3 mg/dL (ref 8.4–10.4)
Chloride: 110 meq/L — ABNORMAL HIGH (ref 98–109)
Creatinine: 1.8 mg/dL — ABNORMAL HIGH (ref 0.6–1.1)
EGFR: 27 ml/min/1.73 m2 — ABNORMAL LOW
Glucose: 84 mg/dL (ref 70–140)
Potassium: 4.3 meq/L (ref 3.5–5.1)
Sodium: 141 meq/L (ref 136–145)
Total Bilirubin: 1.38 mg/dL — ABNORMAL HIGH (ref 0.20–1.20)
Total Protein: 5.5 g/dL — ABNORMAL LOW (ref 6.4–8.3)

## 2015-05-30 LAB — CBC WITH DIFFERENTIAL/PLATELET
BASO%: 2.5 % — ABNORMAL HIGH (ref 0.0–2.0)
Basophils Absolute: 0 10e3/uL (ref 0.0–0.1)
EOS%: 1 % (ref 0.0–7.0)
Eosinophils Absolute: 0 10e3/uL (ref 0.0–0.5)
HCT: 29.6 % — ABNORMAL LOW (ref 34.8–46.6)
HGB: 9.8 g/dL — ABNORMAL LOW (ref 11.6–15.9)
LYMPH%: 13.4 % — ABNORMAL LOW (ref 14.0–49.7)
MCH: 33.3 pg (ref 25.1–34.0)
MCHC: 33.2 g/dL (ref 31.5–36.0)
MCV: 100.4 fL (ref 79.5–101.0)
MONO#: 0.3 10e3/uL (ref 0.1–0.9)
MONO%: 16.6 % — ABNORMAL HIGH (ref 0.0–14.0)
NEUT#: 1.1 10e3/uL — ABNORMAL LOW (ref 1.5–6.5)
NEUT%: 66.5 % (ref 38.4–76.8)
Platelets: 145 10e3/uL (ref 145–400)
RBC: 2.95 10e6/uL — ABNORMAL LOW (ref 3.70–5.45)
RDW: 22.4 % — ABNORMAL HIGH (ref 11.2–14.5)
WBC: 1.6 10e3/uL — ABNORMAL LOW (ref 3.9–10.3)
lymph#: 0.2 10e3/uL — ABNORMAL LOW (ref 0.9–3.3)

## 2015-05-30 MED ORDER — RADIAPLEXRX EX GEL
Freq: Once | CUTANEOUS | Status: AC
  Administered 2015-05-30: 11:00:00 via TOPICAL

## 2015-05-30 NOTE — Progress Notes (Signed)
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 Patient diagnosed in September 2016, presented with right lower lobe lung mass in addition to mediastinal lymphadenopathy  PRIOR THERAPY: None  CURRENT THERAPY: Concurrent chemoradiation with weekly carboplatin for AUC of 2 and paclitaxel 45 MG/M2. Started on 04/04/15. Status post 4 cycles.  INTERVAL HISTORY: Kristy [name] 69 y.o. female returns to the clinic today for follow-up visit accompanied by her husband. The patient is tolerating her treatment with concurrent chemoradiation fairly well except for mild pancytopenia The patient denied having any significant swallowing issues. She has no chest pain but continues to have shortness of breath with exertion. She denied having any significant weight loss or night sweats. She is finishing radiation on 05/31/2015.  MEDICAL HISTORY: Past Medical History  Diagnosis Date  . Atrial fibrillation ([place]) 09/29/2008    Paroxysmal, amiodarone therapy with cardioversion to sinus rhythm in the past. / cardioversion on higher dose amiodarone successful October, 2010 / amiodarone stopped or pulmonary illness November, 2010, patient improved, amiodarone not restarted / regular rhythm January, 2011 / returned atrial fibrillation by March, 2011... plan rate control  . RENAL INSUFFICIENCY 09/29/2008    Creatinine 1.9  . VENTRICULAR HYPERTROPHY, LEFT 09/29/2008  . PULMONARY HYPERTENSION 09/29/2008  . ALLERGIC RHINITIS 09/29/2008  . HYPERCHOLESTEROLEMIA 09/29/2008  . ANXIETY DEPRESSION 09/29/2008  . OBESITY 09/29/2008  . GERD 09/29/2008  . HYPERTENSION 09/29/2008    No echo data as of April, 2011 ( normal LV function by history)  . PEPTIC ULCER DISEASE 09/29/2008  . THYROTOXICOSIS 09/29/2008  . CHEST  DISCOMFORT 05/30/2009    In October,2010  ,??GI??  . CAD 10/20/2008    Catheterization [facility] 2001, 70% circumflex / nuclear April, 2010 no ischemia, not gated with atrial fib  . Cough 04/12/2009    Her lisinopril held October, 2010, cough resolved  . Warfarin anticoagulation   . Peptic ulcer disease   . Fluid overload     Improvement with Zaroxolyn July, 2011  . Shortness of breath     Amiodarone will not be restarted  /     Pulmonary illness November, 2010, abnormal CT, sedimentation rate not elevated, Dr Melvyn Novas saw patient,, no desaturation walking April, 2011, plan to follow  . Chronic diastolic CHF (congestive heart failure) ([place])   . Ejection fraction   . Cancer ([place])     Lung Cancer    ALLERGIES:  is allergic to penicillins; tramadol; amiodarone; atorvastatin; bystolic; cholestyramine; clonidine hydrochloride; lansoprazole; oxycodone-acetaminophen; prevalite; propoxyphene hcl; propoxyphene n-acetaminophen; and aspirin.  MEDICATIONS:  Current Outpatient Prescriptions  Medication Sig Dispense Refill  . acyclovir (ZOVIRAX) 200 MG capsule Take 1 capsule (200 mg total) by mouth 5 (five) times daily. 50 capsule 0  . albuterol (PROVENTIL HFA;VENTOLIN HFA) 108 (90 BASE) MCG/ACT inhaler Inhale 2 puffs into the lungs every 6 (six) hours as needed for wheezing or shortness of breath.    . allopurinol (ZYLOPRIM) 300 MG tablet Take 300 mg by mouth every morning.     Marland Kitchen apixaban (ELIQUIS) 5 MG TABS tablet Take 10 mg (2 tabs) PO BID x 7 days; then decrease to 5 mg (1 tab) PO BID. 60 tablet 1  . atenolol (TENORMIN) 100 MG tablet Take 100 mg by mouth every morning.     Marland Kitchen  chlorhexidine (PERIDEX) 0.12 % solution Rinse with 1/2 ounce by mouth for 30 seconds then spit out. use twice a day as needed for tooth issues  0  . diltiazem (CARDIZEM CD) 240 MG 24 hr capsule Take 1 capsule (240 mg total) by mouth daily. 90 capsule 3  . docusate sodium (COLACE) 100 MG capsule Take 100 mg by mouth daily as needed for  mild constipation.    . furosemide (LASIX) 80 MG tablet Take 40 mg by mouth every morning.   0  . HYDROcodone-acetaminophen (NORCO/VICODIN) 5-325 MG per tablet Take 1 tablet by mouth every 6 (six) hours as needed for moderate pain.    Marland Kitchen latanoprost (XALATAN) 0.005 % ophthalmic solution Place 1 drop into both eyes at bedtime.    Marland Kitchen levothyroxine (SYNTHROID, LEVOTHROID) 150 MCG tablet Take 150 mcg by mouth every morning.     . lidocaine-prilocaine (EMLA) cream Apply to port 1-2 hours before accessing 30 g 1  . lisinopril (PRINIVIL,ZESTRIL) 40 MG tablet Take 40 mg by mouth every morning.     Marland Kitchen LORazepam (ATIVAN) 0.5 MG tablet Take 0.5 mg by mouth at bedtime as needed for anxiety or sleep.     . minoxidil (LONITEN) 2.5 MG tablet Take 1 tablet by mouth every morning.     Marland Kitchen omeprazole (PRILOSEC) 20 MG capsule Take 20 mg by mouth every morning.     . prochlorperazine (COMPAZINE) 10 MG tablet Take 1 tablet (10 mg total) by mouth every 6 (six) hours as needed for nausea or vomiting. 30 tablet 0  . ranitidine (ZANTAC) 150 MG tablet     . sucralfate (CARAFATE) 1 G tablet Take 1 tablet (1 g total) by mouth 4 (four) times daily. 120 tablet 2  . sulfamethoxazole-trimethoprim (BACTRIM DS,SEPTRA DS) 800-160 MG tablet Take 1 tablet by mouth 2 (two) times daily. 20 tablet 0  . traMADol (ULTRAM) 50 MG tablet Take 1 tablet (50 mg total) by mouth every 6 (six) hours as needed. (Patient taking differently: Take 50 mg by mouth every 6 (six) hours as needed for moderate pain. ) 30 tablet 0  . traZODone (DESYREL) 50 MG tablet Take 1 tablet by mouth at bedtime as needed for sleep.     Marland Kitchen warfarin (COUMADIN) 5 MG tablet take 1 tablet by mouth as directed by prescriber PER INR RESULTS  0  . Wound Cleansers (RADIAPLEX EX) Apply 1 application topically daily as needed (irritaiton on hands/arm).      No current facility-administered medications for this visit.    SURGICAL HISTORY:  Past Surgical History  Procedure Laterality  Date  . Tonsillectomy and adenoidectomy  1953  . Dilation and curettage of uterus  2004    x2  . Hernia repair  2003    REVIEW OF SYSTEMS:  A comprehensive review of systems was negative except for: Constitutional: positive for fatigue Respiratory: positive for dyspnea on exertion   PHYSICAL EXAMINATION: General appearance: alert, cooperative, fatigued and no distress Head: Normocephalic, without obvious abnormality, atraumatic Neck: no adenopathy, no JVD, supple, symmetrical, trachea midline and thyroid not enlarged, symmetric, no tenderness/mass/nodules Lymph nodes: Cervical, supraclavicular, and axillary nodes normal. Resp: clear to auscultation bilaterally Back: symmetric, no curvature. ROM normal. No CVA tenderness. Cardio: regular rate and rhythm, S1, S2 normal, no murmur, click, rub or gallop GI: soft, non-tender; bowel sounds normal; no masses,  no organomegaly Extremities: extremities normal, atraumatic, no cyanosis or edema  ECOG PERFORMANCE STATUS: 2 - Symptomatic, <50% confined to bed  Blood pressure  154/84, pulse 98, temperature 97.5 F (36.4 C), temperature source Oral, resp. rate 18, height '5\' 6"'$  (1.676 m), weight 202 lb 9.6 oz (91.899 kg), SpO2 98 %.  LABORATORY DATA: Lab Results  Component Value Date   WBC 1.6* 05/30/2015   HGB 9.8* 05/30/2015   HCT 29.6* 05/30/2015   MCV 100.4 05/30/2015   PLT 145 05/30/2015    CMP Latest Ref Rng 05/30/2015 05/23/2015 05/16/2015  Glucose 70 - 140 mg/dl 84 93 103  BUN 7.0 - 26.0 mg/dL 19.1 20.7 24.0  Creatinine 0.6 - 1.1 mg/dL 1.8(H) 1.6(H) 1.9(H)  Sodium 136 - 145 mEq/L 141 139 140  Potassium 3.5 - 5.1 mEq/L 4.3 3.8 4.6  Chloride 101 - 111 mmol/L - - -  CO2 22 - 29 mEq/L 23 23 21(L)  Calcium 8.4 - 10.4 mg/dL 9.3 9.0 9.2  Total Protein 6.4 - 8.3 g/dL 5.5(L) 5.5(L) 5.2(L)  Total Bilirubin 0.20 - 1.20 mg/dL 1.38(H) 2.26(H) 1.33(H)  Alkaline Phos 40 - 150 U/L 57 55 57  AST 5 - 34 U/L '12 13 14  '$ ALT 0 - 55 U/L '9 10 11     '$ RADIOGRAPHIC STUDIES: No results found.  ASSESSMENT AND PLAN: This is a very pleasant 69 years old white female with a stage IIIa non-small cell lung cancer currently undergoing a course of concurrent chemoradiation with weekly carboplatin and paclitaxel status post 6 cycles. The patient is tolerating her treatment well except for mild pancytopenia. As the patient is finishing her radiation treatments on 05/31/2015, Dr. Earlie Server, who has seen and evaluated the patient and reviewed the chart, recommended to cancel the last treatment. The patient is in agreement with this. She will return as scheduled by Dr. Lynne Leader 6 weeks, and we will proceed in 5 weeks to perform staging CTs, which are to be reviewed prior to her visit.  The patient was advised to call immediately if she has any concerning symptoms in the interval. The patient voices understanding of current disease status and treatment options and is in agreement with the current care plan.  All questions were answered. The patient knows to call the clinic with any problems, questions or concerns. We can certainly see the patient much sooner if necessary.  ADDENDUM: Hematology/Oncology Attending: I had a face to face encounter with the patient today. I recommended her care plan. This is a very pleasant 69 years old white female with stage IIIa non-small cell lung cancer, squamous cell carcinoma who is currently undergoing a course of concurrent chemoradiation with weekly carboplatin and paclitaxel. She status post 6 weeks of treatment. Her chemotherapy was missed in the last 2 weeks secondary to pancytopenia. Her absolute neutrophil count is a stay low today. I recommended for the patient to complete the last 2 fraction of radiotherapy today and tomorrow as a scheduled. She will not receive systemic chemotherapy today. I will arrange for the patient to have repeat CT scan of the chest in 5 weeks for restaging of her disease and the patient  would come back for follow-up visit at that time. She was advised to call immediately if she has any concerning symptoms in the interval.  Disclaimer: This note was dictated with voice recognition software. Similar sounding words can inadvertently be transcribed and may be missed upon review. Eilleen Kempf., MD 05/30/2015

## 2015-05-30 NOTE — Telephone Encounter (Signed)
per pof to sch pt appt-gave pt copy of avs-sent MW email to sch trmt per pof

## 2015-05-30 NOTE — Telephone Encounter (Signed)
Per staff message and POF I have scheduled appts. Advised scheduler of appts. JMW  

## 2015-05-31 ENCOUNTER — Ambulatory Visit
Admission: RE | Admit: 2015-05-31 | Discharge: 2015-05-31 | Disposition: A | Discharge status: Home / Self Care | Payer: Medicare Other | Source: Ambulatory Visit | Attending: Radiation Oncology | Admitting: Radiation Oncology

## 2015-05-31 ENCOUNTER — Encounter: Payer: Self-pay | Admitting: Radiation Oncology

## 2015-05-31 ENCOUNTER — Ambulatory Visit: Payer: Medicare Other

## 2015-05-31 VITALS — BP 183/94 | HR 77 | Resp 16 | Wt 204.0 lb

## 2015-05-31 DIAGNOSIS — Z510 Encounter for antineoplastic radiation therapy: Secondary | ICD-10-CM | POA: Diagnosis not present

## 2015-05-31 DIAGNOSIS — C3431 Malignant neoplasm of lower lobe, right bronchus or lung: Secondary | ICD-10-CM

## 2015-05-31 NOTE — Progress Notes (Signed)
Department of Radiation Oncology  Phone:  [phone] Fax:        [phone]  Weekly Treatment Note    Name: Kristy [name] Date: 05/31/2015 MRN: [id number] DOB: 1945/08/19   Current dose: 66 Gy  Current fraction: 33   MEDICATIONS: Current Outpatient Prescriptions  Medication Sig Dispense Refill  . acyclovir (ZOVIRAX) 200 MG capsule Take 1 capsule (200 mg total) by mouth 5 (five) times daily. 50 capsule 0  . albuterol (PROVENTIL HFA;VENTOLIN HFA) 108 (90 BASE) MCG/ACT inhaler Inhale 2 puffs into the lungs every 6 (six) hours as needed for wheezing or shortness of breath.    . allopurinol (ZYLOPRIM) 300 MG tablet Take 300 mg by mouth every morning.     Marland Kitchen apixaban (ELIQUIS) 5 MG TABS tablet Take 10 mg (2 tabs) PO BID x 7 days; then decrease to 5 mg (1 tab) PO BID. 60 tablet 1  . atenolol (TENORMIN) 100 MG tablet Take 100 mg by mouth every morning.     . chlorhexidine (PERIDEX) 0.12 % solution Rinse with 1/2 ounce by mouth for 30 seconds then spit out. use twice a day as needed for tooth issues  0  . diltiazem (CARDIZEM CD) 240 MG 24 hr capsule Take 1 capsule (240 mg total) by mouth daily. 90 capsule 3  . docusate sodium (COLACE) 100 MG capsule Take 100 mg by mouth daily as needed for mild constipation.    . furosemide (LASIX) 80 MG tablet Take 40 mg by mouth every morning.   0  . HYDROcodone-acetaminophen (NORCO/VICODIN) 5-325 MG per tablet Take 1 tablet by mouth every 6 (six) hours as needed for moderate pain.    Marland Kitchen latanoprost (XALATAN) 0.005 % ophthalmic solution Place 1 drop into both eyes at bedtime.    Marland Kitchen levothyroxine (SYNTHROID, LEVOTHROID) 150 MCG tablet Take 150 mcg by mouth every morning.     . lidocaine-prilocaine (EMLA) cream Apply to port 1-2 hours before accessing 30 g 1  . lisinopril (PRINIVIL,ZESTRIL) 40 MG tablet Take 40 mg by mouth every morning.     Marland Kitchen LORazepam (ATIVAN) 0.5 MG tablet Take 0.5 mg by mouth at bedtime as needed for anxiety or sleep.     .  minoxidil (LONITEN) 2.5 MG tablet Take 1 tablet by mouth every morning.     Marland Kitchen omeprazole (PRILOSEC) 20 MG capsule Take 20 mg by mouth every morning.     . prochlorperazine (COMPAZINE) 10 MG tablet Take 1 tablet (10 mg total) by mouth every 6 (six) hours as needed for nausea or vomiting. 30 tablet 0  . ranitidine (ZANTAC) 150 MG tablet     . sucralfate (CARAFATE) 1 G tablet Take 1 tablet (1 g total) by mouth 4 (four) times daily. 120 tablet 2  . sulfamethoxazole-trimethoprim (BACTRIM DS,SEPTRA DS) 800-160 MG tablet Take 1 tablet by mouth 2 (two) times daily. 20 tablet 0  . traMADol (ULTRAM) 50 MG tablet Take 1 tablet (50 mg total) by mouth every 6 (six) hours as needed. (Patient taking differently: Take 50 mg by mouth every 6 (six) hours as needed for moderate pain. ) 30 tablet 0  . traZODone (DESYREL) 50 MG tablet Take 1 tablet by mouth at bedtime as needed for sleep.     Marland Kitchen warfarin (COUMADIN) 5 MG tablet take 1 tablet by mouth as directed by prescriber PER INR RESULTS  0  . Wound Cleansers (RADIAPLEX EX) Apply 1 application topically daily as needed (irritaiton on hands/arm).      No  current facility-administered medications for this encounter.     ALLERGIES: Penicillins; Tramadol; Amiodarone; Atorvastatin; Bystolic; Cholestyramine; Clonidine hydrochloride; Lansoprazole; Oxycodone-acetaminophen; Prevalite; Propoxyphene hcl; Propoxyphene n-acetaminophen; and Aspirin   LABORATORY DATA:  Lab Results  Component Value Date   WBC 1.6* 05/30/2015   HGB 9.8* 05/30/2015   HCT 29.6* 05/30/2015   MCV 100.4 05/30/2015   PLT 145 05/30/2015   Lab Results  Component Value Date   NA 141 05/30/2015   K 4.3 05/30/2015   CL 99* 03/09/2015   CO2 23 05/30/2015   Lab Results  Component Value Date   ALT <9 05/30/2015   AST 12 05/30/2015   ALKPHOS 57 05/30/2015   BILITOT 1.38* 05/30/2015     NARRATIVE: Tkeya P Rundle was seen today for weekly treatment management. The chart was checked and the  patient's films were reviewed.  Weight stable. BP elevated. Missed morning dose of bp medication but, plans to take this when she returns home. Hyperpigmentation of upper back noted. Reports using radiaplex as directed. Shingles appear to be drying up. Denies difficulty swallowing so long as she uses carafate. Denies cough. Reports SOB with mild exertion. Reports fatigue. One month follow up appointment card given.    ,BP 183/94 mmHg  Pulse 77  Resp 16  Wt 204 lb (92.534 kg)  SpO2 100% Wt Readings from Last 3 Encounters:  05/31/15 204 lb (92.534 kg)  05/30/15 202 lb 9.6 oz (91.899 kg)  05/27/15 201 lb 4.8 oz (91.309 kg)     PHYSICAL EXAMINATION: weight is 204 lb (92.534 kg). Her blood pressure is 183/94 and her pulse is 77. Her respiration is 16 and oxygen saturation is 100%.        ASSESSMENT: The patient is doing satisfactorily with treatment.  The patient is doing well. She states that esophagitis is well controlled. Then doing very well also.  PLAN: We will continue with the patient's radiation treatment as planned. The patient will follow-up in one month.

## 2015-05-31 NOTE — Progress Notes (Signed)
Weight stable. BP elevated. Missed morning dose of bp medication but, plans to take this when she returns home. Hyperpigmentation of upper back noted. Reports using radiaplex as directed. Shingles appear to be drying up. Denies difficulty swallowing so long as she uses carafate. Denies cough. Reports SOB with mild exertion. Reports fatigue. One month follow up appointment card given.    ,BP 183/94 mmHg  Pulse 77  Resp 16  Wt 204 lb (92.534 kg)  SpO2 100% Wt Readings from Last 3 Encounters:  05/31/15 204 lb (92.534 kg)  05/30/15 202 lb 9.6 oz (91.899 kg)  05/27/15 201 lb 4.8 oz (91.309 kg)

## 2015-06-01 ENCOUNTER — Ambulatory Visit: Payer: Medicare Other

## 2015-06-12 NOTE — Progress Notes (Signed)
  Radiation Oncology         [phone] ________________________________  Name: Kristy [name] MRN: [id number]  Date: 05/31/2015  DOB: 04-21-1946  End of Treatment Note        1. squamous cell Cancer of lower lobe of right lung 162.5 C34.31    DIAGNOSIS: 69 yo woman with Stage T2b N2M0 squamous cell carcinoma of the right lower lung     Indication for treatment:  Curative, Concurrent chemotherapy       Radiation treatment dates:   04/18/2015-05/31/2015  Site/dose:   Right lower lung and lymph nodes were treated to 66 Gy in 33 fractions of 2 Gy  Beams/energy:   A 5 field technique was used to deliver 3D conformal radiation using a combination of 6, 10, and 15 MV X-rays.  Narrative: The patient tolerated radiation treatment relatively well.  She had hyperpigmentation of her mid back. She had an outbreak of shingles during radiation. She had some esophagus symptoms which responded to carafate.   Plan: The patient has completed radiation treatment. The patient will return to radiation oncology clinic for routine followup in one month. I advised her to call or return sooner if she has any questions or concerns related to her recovery or treatment. ________________________________  [name]. Tammi Klippel, M.D.  This document serves as a record of services personally performed by Tyler Pita, MD. It was created on his behalf by Arlyce Harman, a trained medical scribe. The creation of this record is based on the scribe's personal observations and the provider's statements to them. This document has been checked and approved by the attending provider.

## 2015-06-17 ENCOUNTER — Telehealth: Payer: Self-pay | Admitting: *Deleted

## 2015-06-17 NOTE — Telephone Encounter (Signed)
If there is concern from her physician there, they can transfer her here.

## 2015-06-17 NOTE — Telephone Encounter (Signed)
Son, Gene called to let us know she was admitted to [facility] ICU- not taking meds, congested, confused. Son wants to know if she should be transferred down here. Son notified that it will be Monday before Dr Julien Nordmann will see this message.

## 2015-06-20 ENCOUNTER — Encounter (HOSPITAL_COMMUNITY): Payer: Self-pay | Admitting: *Deleted

## 2015-06-20 ENCOUNTER — Inpatient Hospital Stay (HOSPITAL_COMMUNITY)
Admission: AD | Admit: 2015-06-20 | Discharge: 2015-06-28 | DRG: 291 | Disposition: A | Discharge status: Skilled Nursing Facility | Payer: Medicare Other | Source: Other Acute Inpatient Hospital | Attending: Internal Medicine | Admitting: Internal Medicine

## 2015-06-20 ENCOUNTER — Telehealth: Payer: Self-pay | Admitting: Medical Oncology

## 2015-06-20 ENCOUNTER — Inpatient Hospital Stay (HOSPITAL_COMMUNITY): Payer: Medicare Other

## 2015-06-20 DIAGNOSIS — I5033 Acute on chronic diastolic (congestive) heart failure: Secondary | ICD-10-CM | POA: Diagnosis not present

## 2015-06-20 DIAGNOSIS — F329 Major depressive disorder, single episode, unspecified: Secondary | ICD-10-CM | POA: Diagnosis present

## 2015-06-20 DIAGNOSIS — Z79899 Other long term (current) drug therapy: Secondary | ICD-10-CM | POA: Diagnosis not present

## 2015-06-20 DIAGNOSIS — Z6830 Body mass index (BMI) 30.0-30.9, adult: Secondary | ICD-10-CM | POA: Diagnosis not present

## 2015-06-20 DIAGNOSIS — Z885 Allergy status to narcotic agent status: Secondary | ICD-10-CM

## 2015-06-20 DIAGNOSIS — C7801 Secondary malignant neoplasm of right lung: Secondary | ICD-10-CM | POA: Diagnosis not present

## 2015-06-20 DIAGNOSIS — Z880 Allergy status to penicillin: Secondary | ICD-10-CM

## 2015-06-20 DIAGNOSIS — K219 Gastro-esophageal reflux disease without esophagitis: Secondary | ICD-10-CM | POA: Diagnosis present

## 2015-06-20 DIAGNOSIS — K5900 Constipation, unspecified: Secondary | ICD-10-CM | POA: Diagnosis present

## 2015-06-20 DIAGNOSIS — R531 Weakness: Secondary | ICD-10-CM | POA: Diagnosis present

## 2015-06-20 DIAGNOSIS — C7931 Secondary malignant neoplasm of brain: Secondary | ICD-10-CM | POA: Diagnosis present

## 2015-06-20 DIAGNOSIS — I10 Essential (primary) hypertension: Secondary | ICD-10-CM | POA: Diagnosis not present

## 2015-06-20 DIAGNOSIS — F419 Anxiety disorder, unspecified: Secondary | ICD-10-CM | POA: Diagnosis present

## 2015-06-20 DIAGNOSIS — J90 Pleural effusion, not elsewhere classified: Secondary | ICD-10-CM | POA: Diagnosis present

## 2015-06-20 DIAGNOSIS — Z85118 Personal history of other malignant neoplasm of bronchus and lung: Secondary | ICD-10-CM | POA: Diagnosis not present

## 2015-06-20 DIAGNOSIS — R42 Dizziness and giddiness: Secondary | ICD-10-CM | POA: Diagnosis not present

## 2015-06-20 DIAGNOSIS — E785 Hyperlipidemia, unspecified: Secondary | ICD-10-CM | POA: Diagnosis present

## 2015-06-20 DIAGNOSIS — I5031 Acute diastolic (congestive) heart failure: Secondary | ICD-10-CM | POA: Diagnosis not present

## 2015-06-20 DIAGNOSIS — R0902 Hypoxemia: Secondary | ICD-10-CM

## 2015-06-20 DIAGNOSIS — C3431 Malignant neoplasm of lower lobe, right bronchus or lung: Secondary | ICD-10-CM | POA: Diagnosis present

## 2015-06-20 DIAGNOSIS — E7800 Pure hypercholesterolemia, unspecified: Secondary | ICD-10-CM | POA: Diagnosis present

## 2015-06-20 DIAGNOSIS — I5043 Acute on chronic combined systolic (congestive) and diastolic (congestive) heart failure: Secondary | ICD-10-CM | POA: Diagnosis present

## 2015-06-20 DIAGNOSIS — Z888 Allergy status to other drugs, medicaments and biological substances status: Secondary | ICD-10-CM | POA: Diagnosis not present

## 2015-06-20 DIAGNOSIS — E032 Hypothyroidism due to medicaments and other exogenous substances: Secondary | ICD-10-CM | POA: Diagnosis not present

## 2015-06-20 DIAGNOSIS — I493 Ventricular premature depolarization: Secondary | ICD-10-CM | POA: Diagnosis present

## 2015-06-20 DIAGNOSIS — N183 Chronic kidney disease, stage 3 (moderate): Secondary | ICD-10-CM | POA: Diagnosis present

## 2015-06-20 DIAGNOSIS — Z7901 Long term (current) use of anticoagulants: Secondary | ICD-10-CM

## 2015-06-20 DIAGNOSIS — J9601 Acute respiratory failure with hypoxia: Secondary | ICD-10-CM | POA: Diagnosis not present

## 2015-06-20 DIAGNOSIS — Z886 Allergy status to analgesic agent status: Secondary | ICD-10-CM | POA: Diagnosis not present

## 2015-06-20 DIAGNOSIS — E039 Hypothyroidism, unspecified: Secondary | ICD-10-CM | POA: Diagnosis present

## 2015-06-20 DIAGNOSIS — I613 Nontraumatic intracerebral hemorrhage in brain stem: Secondary | ICD-10-CM | POA: Diagnosis present

## 2015-06-20 DIAGNOSIS — I4891 Unspecified atrial fibrillation: Secondary | ICD-10-CM | POA: Diagnosis present

## 2015-06-20 DIAGNOSIS — E669 Obesity, unspecified: Secondary | ICD-10-CM | POA: Diagnosis present

## 2015-06-20 DIAGNOSIS — I2510 Atherosclerotic heart disease of native coronary artery without angina pectoris: Secondary | ICD-10-CM | POA: Diagnosis present

## 2015-06-20 DIAGNOSIS — Z66 Do not resuscitate: Secondary | ICD-10-CM | POA: Diagnosis present

## 2015-06-20 DIAGNOSIS — I272 Other secondary pulmonary hypertension: Secondary | ICD-10-CM | POA: Diagnosis present

## 2015-06-20 DIAGNOSIS — J9621 Acute and chronic respiratory failure with hypoxia: Secondary | ICD-10-CM | POA: Diagnosis present

## 2015-06-20 DIAGNOSIS — J9690 Respiratory failure, unspecified, unspecified whether with hypoxia or hypercapnia: Secondary | ICD-10-CM

## 2015-06-20 DIAGNOSIS — C7800 Secondary malignant neoplasm of unspecified lung: Secondary | ICD-10-CM | POA: Diagnosis present

## 2015-06-20 DIAGNOSIS — I130 Hypertensive heart and chronic kidney disease with heart failure and stage 1 through stage 4 chronic kidney disease, or unspecified chronic kidney disease: Secondary | ICD-10-CM | POA: Diagnosis present

## 2015-06-20 DIAGNOSIS — D61810 Antineoplastic chemotherapy induced pancytopenia: Secondary | ICD-10-CM | POA: Diagnosis present

## 2015-06-20 DIAGNOSIS — Z515 Encounter for palliative care: Secondary | ICD-10-CM

## 2015-06-20 DIAGNOSIS — R5383 Other fatigue: Secondary | ICD-10-CM | POA: Diagnosis present

## 2015-06-20 DIAGNOSIS — I313 Pericardial effusion (noninflammatory): Secondary | ICD-10-CM | POA: Diagnosis present

## 2015-06-20 DIAGNOSIS — C3491 Malignant neoplasm of unspecified part of right bronchus or lung: Secondary | ICD-10-CM | POA: Diagnosis not present

## 2015-06-20 DIAGNOSIS — T451X5A Adverse effect of antineoplastic and immunosuppressive drugs, initial encounter: Secondary | ICD-10-CM | POA: Diagnosis present

## 2015-06-20 DIAGNOSIS — J9622 Acute and chronic respiratory failure with hypercapnia: Secondary | ICD-10-CM | POA: Diagnosis present

## 2015-06-20 DIAGNOSIS — C3490 Malignant neoplasm of unspecified part of unspecified bronchus or lung: Secondary | ICD-10-CM | POA: Diagnosis present

## 2015-06-20 LAB — MRSA PCR SCREENING: MRSA BY PCR: POSITIVE — AB

## 2015-06-20 MED ORDER — SODIUM CHLORIDE 0.9 % IJ SOLN
10.0000 mL | INTRAMUSCULAR | Status: DC | PRN
  Administered 2015-06-20 – 2015-06-28 (×6): 10 mL
  Filled 2015-06-20 (×5): qty 40

## 2015-06-20 NOTE — Telephone Encounter (Signed)
Pt is going to be transferred to [facility] ICU from [place] Pt is on ventilator.

## 2015-06-21 ENCOUNTER — Inpatient Hospital Stay (HOSPITAL_COMMUNITY): Payer: Medicare Other

## 2015-06-21 DIAGNOSIS — C3490 Malignant neoplasm of unspecified part of unspecified bronchus or lung: Secondary | ICD-10-CM | POA: Diagnosis present

## 2015-06-21 DIAGNOSIS — J90 Pleural effusion, not elsewhere classified: Secondary | ICD-10-CM | POA: Diagnosis present

## 2015-06-21 DIAGNOSIS — R0902 Hypoxemia: Secondary | ICD-10-CM

## 2015-06-21 DIAGNOSIS — I10 Essential (primary) hypertension: Secondary | ICD-10-CM | POA: Diagnosis present

## 2015-06-21 DIAGNOSIS — I5033 Acute on chronic diastolic (congestive) heart failure: Secondary | ICD-10-CM

## 2015-06-21 DIAGNOSIS — E039 Hypothyroidism, unspecified: Secondary | ICD-10-CM | POA: Diagnosis present

## 2015-06-21 LAB — CBC
HEMATOCRIT: 35 % — AB (ref 36.0–46.0)
HEMOGLOBIN: 11 g/dL — AB (ref 12.0–15.0)
MCH: 32.2 pg (ref 26.0–34.0)
MCHC: 31.4 g/dL (ref 30.0–36.0)
MCV: 102.3 fL — ABNORMAL HIGH (ref 78.0–100.0)
Platelets: 111 10*3/uL — ABNORMAL LOW (ref 150–400)
RBC: 3.42 MIL/uL — AB (ref 3.87–5.11)
RDW: 15.3 % (ref 11.5–15.5)
WBC: 3.5 10*3/uL — AB (ref 4.0–10.5)

## 2015-06-21 LAB — PROTIME-INR
INR: 1.31 (ref 0.00–1.49)
PROTHROMBIN TIME: 16.4 s — AB (ref 11.6–15.2)

## 2015-06-21 LAB — COMPREHENSIVE METABOLIC PANEL
ALBUMIN: 3.2 g/dL — AB (ref 3.5–5.0)
ALK PHOS: 49 U/L (ref 38–126)
ALT: 10 U/L — ABNORMAL LOW (ref 14–54)
AST: 16 U/L (ref 15–41)
Anion gap: 7 (ref 5–15)
BUN: 20 mg/dL (ref 6–20)
CHLORIDE: 95 mmol/L — AB (ref 101–111)
CO2: 38 mmol/L — AB (ref 22–32)
CREATININE: 2.04 mg/dL — AB (ref 0.44–1.00)
Calcium: 9.5 mg/dL (ref 8.9–10.3)
GFR calc non Af Amer: 23 mL/min — ABNORMAL LOW (ref >60)
GFR, EST AFRICAN AMERICAN: 27 mL/min — AB (ref >60)
Glucose, Bld: 123 mg/dL — ABNORMAL HIGH (ref 65–99)
Potassium: 3.6 mmol/L (ref 3.5–5.1)
SODIUM: 140 mmol/L (ref 135–145)
Total Bilirubin: 1.4 mg/dL — ABNORMAL HIGH (ref 0.3–1.2)
Total Protein: 5.6 g/dL — ABNORMAL LOW (ref 6.5–8.1)

## 2015-06-21 LAB — APTT: aPTT: 40 seconds — ABNORMAL HIGH (ref 24–37)

## 2015-06-21 LAB — TROPONIN I: TROPONIN I: 0.06 ng/mL — AB (ref <0.031)

## 2015-06-21 LAB — PHOSPHORUS: Phosphorus: 3.2 mg/dL (ref 2.5–4.6)

## 2015-06-21 LAB — TSH: TSH: 60.698 u[IU]/mL — AB (ref 0.350–4.500)

## 2015-06-21 LAB — MAGNESIUM: Magnesium: 2 mg/dL (ref 1.7–2.4)

## 2015-06-21 LAB — T4, FREE: FREE T4: 0.53 ng/dL — AB (ref 0.61–1.12)

## 2015-06-21 LAB — BRAIN NATRIURETIC PEPTIDE: B NATRIURETIC PEPTIDE 5: 122 pg/mL — AB (ref 0.0–100.0)

## 2015-06-21 LAB — HEPARIN LEVEL (UNFRACTIONATED): Heparin Unfractionated: >2.2 IU/mL — ABNORMAL HIGH (ref 0.30–0.70)

## 2015-06-21 MED ORDER — MUPIROCIN 2 % EX OINT
1.0000 "application " | TOPICAL_OINTMENT | Freq: Two times a day (BID) | CUTANEOUS | Status: AC
  Administered 2015-06-21 – 2015-06-25 (×10): 1 via NASAL
  Filled 2015-06-21 (×4): qty 22

## 2015-06-21 MED ORDER — SODIUM CHLORIDE 0.9 % IV SOLN: 250.0000 mL | INTRAVENOUS | Status: DC | PRN

## 2015-06-21 MED ORDER — FAMOTIDINE IN NACL 20-0.9 MG/50ML-% IV SOLN
20.0000 mg | INTRAVENOUS | Status: DC
  Administered 2015-06-22 – 2015-06-23 (×2): 20 mg via INTRAVENOUS
  Filled 2015-06-21 (×2): qty 50

## 2015-06-21 MED ORDER — LEVOTHYROXINE SODIUM 100 MCG IV SOLR
75.0000 ug | Freq: Every day | INTRAVENOUS | Status: DC
  Administered 2015-06-21 – 2015-06-23 (×3): 75 ug via INTRAVENOUS
  Filled 2015-06-21 (×3): qty 5

## 2015-06-21 MED ORDER — SUCRALFATE 1 G PO TABS
1.0000 g | ORAL_TABLET | Freq: Three times a day (TID) | ORAL | Status: DC
  Administered 2015-06-21 – 2015-06-28 (×27): 1 g via ORAL
  Filled 2015-06-21 (×27): qty 1

## 2015-06-21 MED ORDER — DILTIAZEM HCL ER COATED BEADS 240 MG PO CP24: 240.0000 mg | ORAL_CAPSULE | Freq: Every day | ORAL | Status: DC

## 2015-06-21 MED ORDER — ALBUTEROL SULFATE (2.5 MG/3ML) 0.083% IN NEBU: 2.5000 mg | INHALATION_SOLUTION | RESPIRATORY_TRACT | Status: DC | PRN

## 2015-06-21 MED ORDER — SODIUM CHLORIDE 0.9 % IV SOLN
INTRAVENOUS | Status: DC
  Administered 2015-06-21: 10 mL/h via INTRAVENOUS

## 2015-06-21 MED ORDER — ATENOLOL 100 MG PO TABS: 100.0000 mg | ORAL_TABLET | Freq: Every day | ORAL | Status: DC

## 2015-06-21 MED ORDER — PANTOPRAZOLE SODIUM 40 MG PO TBEC
40.0000 mg | DELAYED_RELEASE_TABLET | Freq: Every day | ORAL | Status: DC
  Administered 2015-06-21 – 2015-06-24 (×4): 40 mg via ORAL
  Filled 2015-06-21 (×4): qty 1

## 2015-06-21 MED ORDER — POTASSIUM CHLORIDE CRYS ER 20 MEQ PO TBCR
40.0000 meq | EXTENDED_RELEASE_TABLET | Freq: Once | ORAL | Status: AC
  Administered 2015-06-21: 40 meq via ORAL
  Filled 2015-06-21: qty 2

## 2015-06-21 MED ORDER — FAMOTIDINE IN NACL 20-0.9 MG/50ML-% IV SOLN
20.0000 mg | Freq: Two times a day (BID) | INTRAVENOUS | Status: DC
  Administered 2015-06-21: 20 mg via INTRAVENOUS
  Filled 2015-06-21: qty 50

## 2015-06-21 MED ORDER — HEPARIN (PORCINE) IN NACL 100-0.45 UNIT/ML-% IJ SOLN
1350.0000 [IU]/h | INTRAMUSCULAR | Status: DC
  Administered 2015-06-21: 1350 [IU]/h via INTRAVENOUS
  Administered 2015-06-21: 1200 [IU]/h via INTRAVENOUS
  Filled 2015-06-21 (×2): qty 250

## 2015-06-21 MED ORDER — MINOXIDIL 2.5 MG PO TABS
2.5000 mg | ORAL_TABLET | Freq: Every day | ORAL | Status: DC
  Administered 2015-06-21 – 2015-06-23 (×3): 2.5 mg via ORAL
  Filled 2015-06-21 (×3): qty 1

## 2015-06-21 MED ORDER — ACYCLOVIR 400 MG PO TABS
200.0000 mg | ORAL_TABLET | Freq: Every day | ORAL | Status: DC
  Administered 2015-06-21 (×4): 200 mg via ORAL
  Filled 2015-06-21 (×2): qty 0.5
  Filled 2015-06-21: qty 1
  Filled 2015-06-21: qty 0.5
  Filled 2015-06-21: qty 1
  Filled 2015-06-21: qty 0.5

## 2015-06-21 MED ORDER — CHLORHEXIDINE GLUCONATE CLOTH 2 % EX PADS
6.0000 | MEDICATED_PAD | Freq: Every day | CUTANEOUS | Status: AC
  Administered 2015-06-22 – 2015-06-26 (×5): 6 via TOPICAL

## 2015-06-21 MED ORDER — ACYCLOVIR 200 MG PO CAPS
200.0000 mg | ORAL_CAPSULE | Freq: Every day | ORAL | Status: DC
  Administered 2015-06-21 – 2015-06-25 (×16): 200 mg via ORAL
  Filled 2015-06-21 (×25): qty 1

## 2015-06-21 MED ORDER — HEPARIN SODIUM (PORCINE) 5000 UNIT/ML IJ SOLN: 5000.0000 [IU] | Freq: Three times a day (TID) | INTRAMUSCULAR | Status: DC

## 2015-06-21 NOTE — Progress Notes (Signed)
ANTICOAGULATION CONSULT NOTE - Follow Up Consult  Pharmacy Consult for Heparin Indication: atrial fibrillation  Allergies  Allergen Reactions  . Penicillins Hives, Shortness Of Breath and Itching    Has patient had a PCN reaction causing immediate rash, facial/tongue/throat swelling, SOB or lightheadedness with hypotension: Yes Has patient had a PCN reaction causing severe rash involving mucus membranes or skin necrosis: Yes Has patient had a PCN reaction that required hospitalization Yes- went to the dr's Has patient had a PCN reaction occurring within the last 10 years: No If all of the above answers are "NO", then may proceed with Cephalosporin use. Broke out from top of head to feet.   . Tramadol Other (See Comments)    Dizziness, and altered mental statas  . Amiodarone Other (See Comments)    Does not remember.   . Atorvastatin Hives and Itching  . Bystolic [Nebivolol Hcl]     "Drops blood pressure too low"  . Cholestyramine     Pt cant remember reaction, "whatever it was, i went straight back to dr and he took me off"  . Clonidine Hydrochloride Other (See Comments)    Does not remember.   . Lansoprazole Other (See Comments)    "upset stomach"  . Oxycodone-Acetaminophen Other (See Comments)    "made me so nervous"  . Prevalite [Cholestyramine Light]   . Propoxyphene Hcl   . Propoxyphene N-Acetaminophen Hives    Unknown reaction  . Aspirin Palpitations    Patient Measurements: Height: '5\' 6"'$  (167.6 cm) Weight: 183 lb 10.3 oz (83.3 kg) IBW/kg (Calculated) : 59.3 Heparin Dosing Weight: 80 kg  Vital Signs: Temp: 98.1 F (36.7 C) (12/13 0800) Temp Source: Oral (12/13 0800) BP: 115/94 mmHg (12/13 0940) Pulse Rate: 86 (12/13 0900)  Labs (at [facility] 12/12): WBC 3.4 Hgb  11.1 Hct  35.1 Plt  128  SCr 2.0  Recent Labs  06/21/15 0145 06/21/15 0210  HGB 11.0*  --   HCT 35.0*  --   PLT 111*  --   APTT  --  40*  LABPROT  --  16.4*  INR  --  1.31  HEPARINUNFRC  --   >2.20*  CREATININE 2.04*  --   TROPONINI 0.06*  --     Estimated Creatinine Clearance: 27.5 mL/min (by C-G formula based on Cr of 2.04).   Medical History: Past Medical History  Diagnosis Date  . Atrial fibrillation ([place]) 09/29/2008    Paroxysmal, amiodarone therapy with cardioversion to sinus rhythm in the past. / cardioversion on higher dose amiodarone successful October, 2010 / amiodarone stopped or pulmonary illness November, 2010, patient improved, amiodarone not restarted / regular rhythm January, 2011 / returned atrial fibrillation by March, 2011... plan rate control  . RENAL INSUFFICIENCY 09/29/2008    Creatinine 1.9  . VENTRICULAR HYPERTROPHY, LEFT 09/29/2008  . PULMONARY HYPERTENSION 09/29/2008  . ALLERGIC RHINITIS 09/29/2008  . HYPERCHOLESTEROLEMIA 09/29/2008  . ANXIETY DEPRESSION 09/29/2008  . OBESITY 09/29/2008  . GERD 09/29/2008  . HYPERTENSION 09/29/2008    No echo data as of April, 2011 ( normal LV function by history)  . PEPTIC ULCER DISEASE 09/29/2008  . THYROTOXICOSIS 09/29/2008  . CHEST DISCOMFORT 05/30/2009    In October,2010  ,??GI??  . CAD 10/20/2008    Catheterization [facility] 2001, 70% circumflex / nuclear April, 2010 no ischemia, not gated with atrial fib  . Cough 04/12/2009    Her lisinopril held October, 2010, cough resolved  . Warfarin anticoagulation   . Peptic ulcer disease   .  Fluid overload     Improvement with Zaroxolyn July, 2011  . Shortness of breath     Amiodarone will not be restarted  /     Pulmonary illness November, 2010, abnormal CT, sedimentation rate not elevated, Dr Melvyn Novas saw patient,, no desaturation walking April, 2011, plan to follow  . Chronic diastolic CHF (congestive heart failure) ([place])   . Ejection fraction   . Cancer [facility])     Lung Cancer    Assessment: 69 y.o. female transferred from [facility] with ARF, h/o Afib and Eliquis on hold. Currently on IV heparin. HL is falsely elevated this AM due to recent Eliquis dose.  APTT sub-therapeutic at 40. H/H and Plt low. RN reports no s/s of bleeding.   Goal of Therapy:  PTT 66-102 while Eliquis interfering with anti-Xa level  Heparin level 0.3-0.7 units/ml Monitor platelets by anticoagulation protocol: Yes   Plan:  Increase heparin infusion to 1350 units/hr. No bolus  Check heparin level/PTT in 8 hours.    Albertina Parr, PharmD., BCPS Clinical Pharmacist Pager [phone]

## 2015-06-21 NOTE — H&P (Signed)
PULMONARY / CRITICAL CARE MEDICINE   Name: Kristy [name] MRN: [id number] DOB: 1945-09-08    ADMISSION DATE:  06/20/2015 CONSULTATION DATE:  06/20/15  REFERRING MD:  Outside hospital  CHIEF COMPLAINT:  Weakness  HISTORY OF PRESENT ILLNESS:  Kristy [name] is a 69 y.o. 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 Julien Nordmann).  She is s/p several cycles of chemo and radiation treatments and is scheduled to follow up with Dr. Julien Nordmann in another 3 or so weeks for staging CT's.  She was admitted to [facility] in [place], [place] on 06/15/15 for generalized weakness, dyspnea, orthopnea, weight gain, and peripheral edema.  Noteable labs that admission included TSH 76, BNP 7453.  Her BP was 226/133 and she was found to have cardiomegaly with bilateral pleural effusions on CXR.  She was treated with BiPAP which she was not using routinely.  Due to most of her care being in [place], she was transferred to [facility] 12/12 for further evaluation and management.  PAST MEDICAL HISTORY :  She  has a past medical history of Atrial fibrillation ([place]) (09/29/2008); RENAL INSUFFICIENCY (09/29/2008); VENTRICULAR HYPERTROPHY, LEFT (09/29/2008); PULMONARY HYPERTENSION (09/29/2008); ALLERGIC RHINITIS (09/29/2008); HYPERCHOLESTEROLEMIA (09/29/2008); ANXIETY DEPRESSION (09/29/2008); OBESITY (09/29/2008); GERD (09/29/2008); HYPERTENSION (09/29/2008); PEPTIC ULCER DISEASE (09/29/2008); THYROTOXICOSIS (09/29/2008); CHEST DISCOMFORT (05/30/2009); CAD (10/20/2008); Cough (04/12/2009); Warfarin anticoagulation; Peptic ulcer disease; Fluid overload; Shortness of breath; Chronic diastolic CHF (congestive heart failure) ([employer]); Ejection fraction; and Cancer ([place]).  PAST SURGICAL HISTORY: She  has past surgical history that includes Tonsillectomy and adenoidectomy (1953); Dilation and curettage of uterus (2004); and Hernia repair (2003).  Allergies  Allergen Reactions  . Penicillins Hives,  Shortness Of Breath and Itching    Has patient had a PCN reaction causing immediate rash, facial/tongue/throat swelling, SOB or lightheadedness with hypotension: Yes Has patient had a PCN reaction causing severe rash involving mucus membranes or skin necrosis: Yes Has patient had a PCN reaction that required hospitalization Yes- went to the dr's Has patient had a PCN reaction occurring within the last 10 years: No If all of the above answers are "NO", then may proceed with Cephalosporin use. Broke out from top of head to feet.   . Tramadol Other (See Comments)    Dizziness, and altered mental statas  . Amiodarone Other (See Comments)    Does not remember.   . Atorvastatin Hives and Itching  . Bystolic [Nebivolol Hcl]     "Drops blood pressure too low"  . Cholestyramine     Pt cant remember reaction, "whatever it was, i went straight back to dr and he took me off"  . Clonidine Hydrochloride Other (See Comments)    Does not remember.   . Lansoprazole Other (See Comments)    "upset stomach"  . Oxycodone-Acetaminophen Other (See Comments)    "made me so nervous"  . Prevalite [Cholestyramine Light]   . Propoxyphene Hcl   . Propoxyphene N-Acetaminophen Hives    Unknown reaction  . Aspirin Palpitations    No current facility-administered medications on file prior to encounter.   Current Outpatient Prescriptions on File Prior to Encounter  Medication Sig  . acyclovir (ZOVIRAX) 200 MG capsule Take 1 capsule (200 mg total) by mouth 5 (five) times daily.  Marland Kitchen albuterol (PROVENTIL HFA;VENTOLIN HFA) 108 (90 BASE) MCG/ACT inhaler Inhale 2 puffs into the lungs every 6 (six) hours as needed for wheezing or shortness of breath.  . allopurinol (ZYLOPRIM) 300 MG  tablet Take 300 mg by mouth every morning.   Marland Kitchen apixaban (ELIQUIS) 5 MG TABS tablet Take 10 mg (2 tabs) PO BID x 7 days; then decrease to 5 mg (1 tab) PO BID.  Marland Kitchen atenolol (TENORMIN) 100 MG tablet Take 100 mg by mouth every morning.   .  chlorhexidine (PERIDEX) 0.12 % solution Rinse with 1/2 ounce by mouth for 30 seconds then spit out. use twice a day as needed for tooth issues  . diltiazem (CARDIZEM CD) 240 MG 24 hr capsule Take 1 capsule (240 mg total) by mouth daily.  Marland Kitchen docusate sodium (COLACE) 100 MG capsule Take 100 mg by mouth daily as needed for mild constipation.  . furosemide (LASIX) 80 MG tablet Take 40 mg by mouth every morning.   Marland Kitchen HYDROcodone-acetaminophen (NORCO/VICODIN) 5-325 MG per tablet Take 1 tablet by mouth every 6 (six) hours as needed for moderate pain.  Marland Kitchen latanoprost (XALATAN) 0.005 % ophthalmic solution Place 1 drop into both eyes at bedtime.  Marland Kitchen levothyroxine (SYNTHROID, LEVOTHROID) 150 MCG tablet Take 150 mcg by mouth every morning.   . lidocaine-prilocaine (EMLA) cream Apply to port 1-2 hours before accessing  . lisinopril (PRINIVIL,ZESTRIL) 40 MG tablet Take 40 mg by mouth every morning.   Marland Kitchen LORazepam (ATIVAN) 0.5 MG tablet Take 0.5 mg by mouth at bedtime as needed for anxiety or sleep.   . minoxidil (LONITEN) 2.5 MG tablet Take 1 tablet by mouth every morning.   Marland Kitchen omeprazole (PRILOSEC) 20 MG capsule Take 20 mg by mouth every morning.   . prochlorperazine (COMPAZINE) 10 MG tablet Take 1 tablet (10 mg total) by mouth every 6 (six) hours as needed for nausea or vomiting.  . ranitidine (ZANTAC) 150 MG tablet   . sucralfate (CARAFATE) 1 G tablet Take 1 tablet (1 g total) by mouth 4 (four) times daily.  Marland Kitchen sulfamethoxazole-trimethoprim (BACTRIM DS,SEPTRA DS) 800-160 MG tablet Take 1 tablet by mouth 2 (two) times daily.  . traMADol (ULTRAM) 50 MG tablet Take 1 tablet (50 mg total) by mouth every 6 (six) hours as needed. (Patient taking differently: Take 50 mg by mouth every 6 (six) hours as needed for moderate pain. )  . traZODone (DESYREL) 50 MG tablet Take 1 tablet by mouth at bedtime as needed for sleep.   Marland Kitchen warfarin (COUMADIN) 5 MG tablet take 1 tablet by mouth as directed by prescriber PER INR RESULTS  .  Wound Cleansers (RADIAPLEX EX) Apply 1 application topically daily as needed (irritaiton on hands/arm).     FAMILY HISTORY:  Her has no family status information on file.   SOCIAL HISTORY: She  reports that she quit smoking about 24 years ago. She does not have any smokeless tobacco history on file. She reports that she does not drink alcohol or use illicit drugs.  REVIEW OF SYSTEMS: All negative; except for those that are bolded, which indicate positives.  Constitutional: weight loss, weight gain, night sweats, fevers, chills, fatigue, weakness.  HEENT: headaches, sore throat, sneezing, nasal congestion, post nasal drip, difficulty swallowing, tooth/dental problems, visual complaints, visual changes, ear aches. Neuro: difficulty with speech, weakness, numbness, ataxia. CV:  chest pain, orthopnea, PND, swelling in lower extremities, dizziness, palpitations, syncope.  Resp: cough, hemoptysis, dyspnea, wheezing. GI  heartburn, indigestion, abdominal pain, nausea, vomiting, diarrhea, constipation, change in bowel habits, loss of appetite, hematemesis, melena, hematochezia.  GU: dysuria, change in color of urine, urgency or frequency, flank pain, hematuria. MSK: joint pain or swelling, decreased range of motion. Psych: change  in mood or affect, depression, anxiety, suicidal ideations, homicidal ideations. Skin: rash, itching, bruising.    SUBJECTIVE:  Denies chest pain, SOB, abd pain.  VITAL SIGNS: BP 116/75 mmHg  Pulse 84  Temp(Src) 98 F (36.7 C) (Oral)  Resp 17  Ht '5\' 6"'$  (1.676 m)  Wt 83.3 kg (183 lb 10.3 oz)  BMI 29.65 kg/m2  SpO2 100%  HEMODYNAMICS:    VENTILATOR SETTINGS:    INTAKE / OUTPUT:     PHYSICAL EXAMINATION: General: Elderly female, in NAD. Neuro: Somnolent but easily arouseable to voice.  Confused at times, but answers basic questions appropriately. HEENT: [place]/AT. PERRL, sclerae anicteric. Cardiovascular: IRIR, no M/R/G. Port in R chest. Lungs:  Respirations shallow and unlabored.  Diminished bilaterally. Abdomen: BS x 4, soft, NT/ND.  Musculoskeletal: No gross deformities, no edema.  Skin: Intact, warm, no rashes.     LABS:  BMET No results for input(s): NA, K, CL, CO2, BUN, CREATININE, GLUCOSE in the last 168 hours.  Electrolytes No results for input(s): CALCIUM, MG, PHOS in the last 168 hours.  CBC No results for input(s): WBC, HGB, HCT, PLT in the last 168 hours.  Coag's No results for input(s): APTT, INR in the last 168 hours.  Sepsis Markers No results for input(s): LATICACIDVEN, PROCALCITON, O2SATVEN in the last 168 hours.  ABG No results for input(s): PHART, PCO2ART, PO2ART in the last 168 hours.  Liver Enzymes No results for input(s): AST, ALT, ALKPHOS, BILITOT, ALBUMIN in the last 168 hours.  Cardiac Enzymes No results for input(s): TROPONINI, PROBNP in the last 168 hours.  Glucose No results for input(s): GLUCAP in the last 168 hours.  Imaging Dg Chest Port 1 View  06/20/2015  CLINICAL DATA:  Acute onset of shortness of breath. Initial encounter. EXAM: PORTABLE CHEST 1 VIEW COMPARISON:  Chest radiograph performed 03/09/2015 FINDINGS: Small bilateral pleural effusions are suspected. Mild left basilar airspace opacity may reflect pneumonia or possibly asymmetric mild interstitial edema. No pneumothorax is seen. The cardiomediastinal silhouette is borderline enlarged. A right-sided chest port is noted ending about the cavoatrial junction. No acute osseous abnormalities are seen. IMPRESSION: Small bilateral pleural effusions suspected. Mild left basilar airspace opacity may reflect pneumonia or possibly mild asymmetric interstitial edema. Borderline cardiomegaly. Electronically Signed   By: Garald Balding M.D.   On: 06/20/2015 23:10     STUDIES:  Echo 12/8 > EF 40% CXR 2023-06-21 (from outside hospital) > mild - mod bilateral pleural effusions.  CULTURES: None  ANTIBIOTICS: None  SIGNIFICANT  EVENTS: 12/7 through 12/12 > admitted to [facility] [place], [place]). 12/12 > transferred to [facility]  LINES/TUBES: None  ASSESSMENT / PLAN:  PULMONARY A: Acute on chronic hypoxic and hypercarbic respiratory failure - likely multifactorial in the setting of CHF exacerbation, pleural effusions, lung CA hx, poorly controlled hypothyroidism / myxedema  Pleural effusions - unclear etiology (transudative from CHF or exudative from lung CA). Hx stage IIIa NSCLC ([place]) of the RLL (followed by Dr. Julien Nordmann). Hx PAH. P:   Continue supplemental O2 as needed to maintain SpO2 > 92%. Aggressive diuresis as BP and SCr permit. May require diagnostic and therapeutic thora depending on AM CXR, bedside US eval Albuterol PRN. CXR in AM.  CARDIOVASCULAR A:  Acute CHF exacerbation - last echo from 06/16/15 with EF 40%.  Per Dr. Kae Heller notes, she also has component of diastolic dysfunction. Hx A.fib, HLD, HTN, CAD s/p cath 2001. P:  Heparin gtt in lieu of outpatient anticoagulation (of note, pt has  both coumadin and apixaban listed on home med list). Aggressive diuresis as BP and SCr permit. Continue outpatient minoxidil. She was not taking her home diltiazem and atenolol Hold outpatient lisinopril.  RENAL A:   CKD. P:   NS @ KVO. CMP now.  GASTROINTESTINAL A:   GERD. Nutrition. P:   SUP: Pantoprazole, sucralfate, famotidine. NPO.  HEMATOLOGIC A:   On chronic anticoagulation due to A.fib (med rec has coumadin and apixaban listed). VTE Prophylaxis. P:  Heparin gtt in lieu of outpatient anticoagulation (of note, pt has both coumadin and apixaban listed on home med list). SCD's. CBC in AM.  INFECTIOUS A:   No indication of infection. P:   Monitor clinically.  ENDOCRINE A:   Significant hypothyroidism - TSH 76 on admission in [place].  Per report, pt had not been taking her meds prior to admit. P:   Continue synthroid, change to IV form 19mg Recheck TSH in few months.    NEUROLOGIC A:   Hx anxiety / depression. P:   Hold outpatient ativan, trazodone, norco.  Family updated: None.  Interdisciplinary Family Meeting v Palliative Care Meeting:  Due by: 12/19.  Can likely transfer out of ICU to SDU or tele in AM 12/13 with TRH takeover starting 12/14.   RMontey Hora PLake ViewPulmonary & Critical Care Medicine Pager: [phone] or ([phone]/13/2016, 12:05 AM   Attending Note:  I have examined patient, reviewed labs, studies and notes. I have discussed the case with RJunius Roads and I agree with the data and plans as amended above. Pt with hx squamous cell Lung CA, HTN and diastolic dysfxn, untreated hypothyroidism, admitted with these issues as well as A fib with resultant acute respiratory failure. Intermittently required biPAP support. BP was controlled and she was diuresed, restarted on synthroid. She had small effusions, discussed thoracentesis but this was deferred[facility]. She does not appear to need BiPAp at this time. Agree with further fine-tuning of her meds, assessment of her pleural effusions w [place] Will notify Dr MJulien Nordmannthat she is here in the am. She can go to SDU or floor bed.   RBaltazar Apo MD, PhD 06/21/2015, 1:45 AM [facility] [phone] if no answer [phone]

## 2015-06-21 NOTE — Progress Notes (Addendum)
Had a discussion with family member who thought pt had only been put on Eliquis at the hospital in [place], [place] (when? which admission?), but he knew she had previously been taking Coumadin. Patient was not alert enough to discuss this with me. Family member will bring in pt's bag of medications tomorrow afternoon. [facility] records indicate she has picked up both medications recently.  Since patient was transferred from OSH and would not have received both anticoagulants at that hospital, I am likely unable to tell tonight if she was taking one or both of them with apTT, HL, and INR testing prior to that admission.   Currently patient is on IV heparin drip and we'll have to wait until family member brings in her bag of medications 12/14.   Bobbiejo Ishikawa S. Alford Highland, PharmD, [place] Clinical Staff Pharmacist Pager [phone]

## 2015-06-21 NOTE — Progress Notes (Signed)
ANTICOAGULATION CONSULT NOTE - Initial Consult  Pharmacy Consult for Heparin Indication: atrial fibrillation  Allergies  Allergen Reactions  . Penicillins Hives, Shortness Of Breath and Itching    Has patient had a PCN reaction causing immediate rash, facial/tongue/throat swelling, SOB or lightheadedness with hypotension: Yes Has patient had a PCN reaction causing severe rash involving mucus membranes or skin necrosis: Yes Has patient had a PCN reaction that required hospitalization Yes- went to the dr's Has patient had a PCN reaction occurring within the last 10 years: No If all of the above answers are "NO", then may proceed with Cephalosporin use. Broke out from top of head to feet.   . Tramadol Other (See Comments)    Dizziness, and altered mental statas  . Amiodarone Other (See Comments)    Does not remember.   . Atorvastatin Hives and Itching  . Bystolic [Nebivolol Hcl]     "Drops blood pressure too low"  . Cholestyramine     Pt cant remember reaction, "whatever it was, i went straight back to dr and he took me off"  . Clonidine Hydrochloride Other (See Comments)    Does not remember.   . Lansoprazole Other (See Comments)    "upset stomach"  . Oxycodone-Acetaminophen Other (See Comments)    "made me so nervous"  . Prevalite [Cholestyramine Light]   . Propoxyphene Hcl   . Propoxyphene N-Acetaminophen Hives    Unknown reaction  . Aspirin Palpitations    Patient Measurements: Height: '5\' 6"'$  (167.6 cm) Weight: 183 lb 10.3 oz (83.3 kg) IBW/kg (Calculated) : 59.3 Heparin Dosing Weight: 80 kg  Vital Signs: Temp: 98 F (36.7 C) (12/12 2000) Temp Source: Oral (12/12 2000) BP: 116/75 mmHg (12/12 2130) Pulse Rate: 84 (12/12 2130)  Labs (at [facility] 12/12): WBC 3.4 Hgb  11.1 Hct  35.1 Plt  128  SCr 2.0 No results for input(s): HGB, HCT, PLT, APTT, LABPROT, INR, HEPARINUNFRC, CREATININE, CKTOTAL, CKMB, TROPONINI in the last 72 hours.  CrCl cannot be calculated (Patient  has no serum creatinine result on file.).   Medical History: Past Medical History  Diagnosis Date  . Atrial fibrillation ([place]) 09/29/2008    Paroxysmal, amiodarone therapy with cardioversion to sinus rhythm in the past. / cardioversion on higher dose amiodarone successful October, 2010 / amiodarone stopped or pulmonary illness November, 2010, patient improved, amiodarone not restarted / regular rhythm January, 2011 / returned atrial fibrillation by March, 2011... plan rate control  . RENAL INSUFFICIENCY 09/29/2008    Creatinine 1.9  . VENTRICULAR HYPERTROPHY, LEFT 09/29/2008  . PULMONARY HYPERTENSION 09/29/2008  . ALLERGIC RHINITIS 09/29/2008  . HYPERCHOLESTEROLEMIA 09/29/2008  . ANXIETY DEPRESSION 09/29/2008  . OBESITY 09/29/2008  . GERD 09/29/2008  . HYPERTENSION 09/29/2008    No echo data as of April, 2011 ( normal LV function by history)  . PEPTIC ULCER DISEASE 09/29/2008  . THYROTOXICOSIS 09/29/2008  . CHEST DISCOMFORT 05/30/2009    In October,2010  ,??GI??  . CAD 10/20/2008    Catheterization [facility] 2001, 70% circumflex / nuclear April, 2010 no ischemia, not gated with atrial fib  . Cough 04/12/2009    Her lisinopril held October, 2010, cough resolved  . Warfarin anticoagulation   . Peptic ulcer disease   . Fluid overload     Improvement with Zaroxolyn July, 2011  . Shortness of breath     Amiodarone will not be restarted  /     Pulmonary illness November, 2010, abnormal CT, sedimentation rate not elevated, Dr Melvyn Novas saw patient,,  no desaturation walking April, 2011, plan to follow  . Chronic diastolic CHF (congestive heart failure) ([place])   . Ejection fraction   . Cancer [facility])     Lung Cancer    Medications (at [facility]):  Acyclovir  Allopurinol  Colace  Lasix  Vicodin  Xalatan  Zestril  Minoxidil  KCL  Prilosec  Zantac  Carafate  Synthroid  Trazodone Eliquis 5 mg BID, last dose 12/12 1000  Assessment: 69 y.o. female transferred from [facility]  with ARF, h/o Afib and Eliquis on hold, for heparin  Goal of Therapy:  PTT 66-102 while Eliquis interfering with anti-Xa level  Heparin level 0.3-0.7 units/ml Monitor platelets by anticoagulation protocol: Yes   Plan:  Start heparin 1200 units/hr Check heparin level/PTT in 8 hours.    Kristy [name], Bronson Curb 06/21/2015,1:48 AM

## 2015-06-21 NOTE — Progress Notes (Addendum)
PULMONARY / CRITICAL CARE MEDICINE   Name: Kristy [name] MRN: [id number] DOB: 1945/12/13    ADMISSION DATE:  06/20/2015 CONSULTATION DATE:  06/20/15  REFERRING MD:  Outside hospital  CHIEF COMPLAINT:  Weakness  HISTORY OF PRESENT ILLNESS:  Kristy [name] is a 70 y.o. 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 Julien Nordmann).  She is s/p several cycles of chemo and radiation treatments and is scheduled to follow up with Dr. Julien Nordmann in another 3 or so weeks for staging CT's.  She was admitted to [facility] in [place], [place] on 06/15/15 for generalized weakness, dyspnea, orthopnea, weight gain, and peripheral edema.  Noteable labs that admission included TSH 76, BNP 7453.  Her BP was 226/133 and she was found to have cardiomegaly with bilateral pleural effusions on CXR.  She was treated with BiPAP which she was not using routinely.  Due to most of her care being in [place], she was transferred to [facility] 12/12 for further evaluation and management.  SUBJECTIVE:  No events overnight, off BiPAP and doing well on [place].  VITAL SIGNS: BP 115/94 mmHg  Pulse 86  Temp(Src) 98.1 F (36.7 C) (Oral)  Resp 20  Ht '5\' 6"'$  (1.676 m)  Wt 83.3 kg (183 lb 10.3 oz)  BMI 29.65 kg/m2  SpO2 100%  HEMODYNAMICS:    VENTILATOR SETTINGS:    INTAKE / OUTPUT: I/O last 3 completed shifts: In: 79.8 [I.V.:79.8] Out: 650 [Urine:650]  PHYSICAL EXAMINATION: General: Elderly female, in NAD. Neuro: Somnolent but easily arouseable to voice.  Confused at times, but answers basic questions appropriately. HEENT: [place]/AT. PERRL, sclerae anicteric. Cardiovascular: IRIR, no M/R/G. Port in R chest. Lungs: Respirations shallow and unlabored.  Diminished bilaterally. Abdomen: BS x 4, soft, NT/ND.  Musculoskeletal: No gross deformities, no edema.  Skin: Intact, warm, no rashes.  LABS:  BMET  Recent Labs Lab 06/21/15 0145  NA 140  K 3.6  CL 95*  CO2 38*   BUN 20  CREATININE 2.04*  GLUCOSE 123*   Electrolytes  Recent Labs Lab 06/21/15 0145  CALCIUM 9.5  MG 2.0  PHOS 3.2   CBC  Recent Labs Lab 06/21/15 0145  WBC 3.5*  HGB 11.0*  HCT 35.0*  PLT 111*   Coag's  Recent Labs Lab 06/21/15 0210  APTT 40*  INR 1.31   Sepsis Markers No results for input(s): LATICACIDVEN, PROCALCITON, O2SATVEN in the last 168 hours.  ABG No results for input(s): PHART, PCO2ART, PO2ART in the last 168 hours.  Liver Enzymes  Recent Labs Lab 06/21/15 0145  AST 16  ALT 10*  ALKPHOS 49  BILITOT 1.4*  ALBUMIN 3.2*   Cardiac Enzymes  Recent Labs Lab 06/21/15 0145  TROPONINI 0.06*   Glucose No results for input(s): GLUCAP in the last 168 hours.  Imaging Dg Chest Port 1 View  06/20/2015  CLINICAL DATA:  Acute onset of shortness of breath. Initial encounter. EXAM: PORTABLE CHEST 1 VIEW COMPARISON:  Chest radiograph performed 03/09/2015 FINDINGS: Small bilateral pleural effusions are suspected. Mild left basilar airspace opacity may reflect pneumonia or possibly asymmetric mild interstitial edema. No pneumothorax is seen. The cardiomediastinal silhouette is borderline enlarged. A right-sided chest port is noted ending about the cavoatrial junction. No acute osseous abnormalities are seen. IMPRESSION: Small bilateral pleural effusions suspected. Mild left basilar airspace opacity may reflect pneumonia or possibly mild asymmetric interstitial edema. Borderline cardiomegaly. Electronically Signed   By: Francoise Schaumann.D.  On: 06/20/2015 23:10   STUDIES:  Echo 12/8 > EF 40% CXR 2023/07/02 (from outside hospital) > mild - mod bilateral pleural effusions.  CULTURES: None  ANTIBIOTICS: None  SIGNIFICANT EVENTS: 12/7 through 12/12 > admitted to [facility] [place], [place]). 12/12 > transferred to [facility]  LINES/TUBES: None  I reviewed CXR myself, bilateral pleural effusions noted.  ASSESSMENT / PLAN:  PULMONARY A: Acute on  chronic hypoxic and hypercarbic respiratory failure - likely multifactorial in the setting of CHF exacerbation, pleural effusions, lung CA hx, poorly controlled hypothyroidism / myxedema  Pleural effusions - unclear etiology (transudative from CHF or exudative from lung CA). Hx stage IIIa NSCLC ([place]) of the RLL (followed by Dr. Julien Nordmann). Hx PAH. P:   Continue supplemental O2 as needed to maintain SpO2 > 92%. Aggressive diuresis as BP and SCr permit. May require diagnostic and therapeutic thora depending on AM CXR, bedside US eval Albuterol PRN. CXR in AM.  CARDIOVASCULAR A:  Acute CHF exacerbation - last echo from 06/16/15 with EF 40%.  Per Dr. Kae Heller notes, she also has component of diastolic dysfunction. Hx A.fib, HLD, HTN, CAD s/p cath 2001. P:  Heparin gtt in lieu of outpatient anticoagulation (of note, pt has both coumadin and apixaban listed on home med list). Aggressive diuresis as BP and SCr permit. Continue outpatient minoxidil. She was not taking her home diltiazem and atenolol Hold outpatient lisinopril.  RENAL A:   CKD. P:   NS @ KVO. CMP now.  GASTROINTESTINAL A:   GERD. Nutrition. P:   SUP: Pantoprazole, sucralfate, famotidine. Start diet.  HEMATOLOGIC A:   On chronic anticoagulation due to A.fib (med rec has coumadin and apixaban listed). VTE Prophylaxis. P:  Heparin gtt in lieu of outpatient anticoagulation (of note, pt has both coumadin and apixaban listed on home med list). SCD's. CBC in AM.  INFECTIOUS A:   No indication of infection. P:   Monitor clinically.  ENDOCRINE A:   Significant hypothyroidism - TSH 76 on admission in [place].  Per report, pt had not been taking her meds prior to admit. P:   Continue synthroid, change to IV form 57mg Recheck TSH in few months.   NEUROLOGIC A:   Hx anxiety / depression. P:   Hold outpatient ativan, trazodone, norco given fluctuant mental status. OOB to chair. PT evaluation.  Family updated:  Spoke with patient and husband, explained case, when questioned code status patient is LCB with no CPR, cardioversion or intubation.  Interdisciplinary Family Meeting v Palliative Care Meeting:  Due by: 12/19.  [facility]  Discussed with [facility]-MD.  WRush Farmer M.D. [facility] Medicine. Pager: [phone] After hours pager: [phone]

## 2015-06-21 NOTE — Progress Notes (Signed)
Utilization Review Completed.Donne Anon T12/13/2016

## 2015-06-22 ENCOUNTER — Inpatient Hospital Stay (HOSPITAL_COMMUNITY): Payer: Medicare Other

## 2015-06-22 DIAGNOSIS — J9621 Acute and chronic respiratory failure with hypoxia: Secondary | ICD-10-CM

## 2015-06-22 DIAGNOSIS — C3491 Malignant neoplasm of unspecified part of right bronchus or lung: Secondary | ICD-10-CM

## 2015-06-22 LAB — BASIC METABOLIC PANEL
ANION GAP: 6 (ref 5–15)
BUN: 26 mg/dL — ABNORMAL HIGH (ref 6–20)
CALCIUM: 9.5 mg/dL (ref 8.9–10.3)
CO2: 36 mmol/L — ABNORMAL HIGH (ref 22–32)
Chloride: 98 mmol/L — ABNORMAL LOW (ref 101–111)
Creatinine, Ser: 2.05 mg/dL — ABNORMAL HIGH (ref 0.44–1.00)
GFR, EST AFRICAN AMERICAN: 27 mL/min — AB (ref >60)
GFR, EST NON AFRICAN AMERICAN: 23 mL/min — AB (ref >60)
GLUCOSE: 110 mg/dL — AB (ref 65–99)
POTASSIUM: 4 mmol/L (ref 3.5–5.1)
Sodium: 140 mmol/L (ref 135–145)

## 2015-06-22 LAB — PHOSPHORUS: PHOSPHORUS: 3.2 mg/dL (ref 2.5–4.6)

## 2015-06-22 LAB — CBC
HEMATOCRIT: 33.5 % — AB (ref 36.0–46.0)
Hemoglobin: 10.7 g/dL — ABNORMAL LOW (ref 12.0–15.0)
MCH: 32.9 pg (ref 26.0–34.0)
MCHC: 31.9 g/dL (ref 30.0–36.0)
MCV: 103.1 fL — AB (ref 78.0–100.0)
PLATELETS: 116 10*3/uL — AB (ref 150–400)
RBC: 3.25 MIL/uL — AB (ref 3.87–5.11)
RDW: 15.1 % (ref 11.5–15.5)
WBC: 3.1 10*3/uL — AB (ref 4.0–10.5)

## 2015-06-22 LAB — APTT: APTT: 151 s — AB (ref 24–37)

## 2015-06-22 LAB — HEPARIN LEVEL (UNFRACTIONATED): Heparin Unfractionated: >2.2 IU/mL — ABNORMAL HIGH (ref 0.30–0.70)

## 2015-06-22 LAB — MAGNESIUM: MAGNESIUM: 2 mg/dL (ref 1.7–2.4)

## 2015-06-22 MED ORDER — HEPARIN (PORCINE) IN NACL 100-0.45 UNIT/ML-% IJ SOLN: 700.0000 [IU]/h | INTRAMUSCULAR | Status: DC

## 2015-06-22 MED ORDER — FUROSEMIDE 10 MG/ML IJ SOLN
40.0000 mg | Freq: Two times a day (BID) | INTRAMUSCULAR | Status: DC
  Administered 2015-06-22 (×2): 40 mg via INTRAVENOUS
  Filled 2015-06-22 (×2): qty 4

## 2015-06-22 MED ORDER — APIXABAN 5 MG PO TABS
5.0000 mg | ORAL_TABLET | Freq: Two times a day (BID) | ORAL | Status: DC
  Administered 2015-06-22 – 2015-06-24 (×4): 5 mg via ORAL
  Filled 2015-06-22 (×4): qty 1

## 2015-06-22 MED ORDER — HEPARIN (PORCINE) IN NACL 100-0.45 UNIT/ML-% IJ SOLN
1000.0000 [IU]/h | INTRAMUSCULAR | Status: DC
  Administered 2015-06-22: 1000 [IU]/h via INTRAVENOUS

## 2015-06-22 MED ORDER — ACETAMINOPHEN 325 MG PO TABS
650.0000 mg | ORAL_TABLET | Freq: Four times a day (QID) | ORAL | Status: DC | PRN
  Administered 2015-06-22 – 2015-06-28 (×6): 650 mg via ORAL
  Filled 2015-06-22 (×6): qty 2

## 2015-06-22 NOTE — Progress Notes (Signed)
[place] for Heparin Indication: atrial fibrillation  Allergies  Allergen Reactions  . Penicillins Hives, Shortness Of Breath and Itching    Has patient had a PCN reaction causing immediate rash, facial/tongue/throat swelling, SOB or lightheadedness with hypotension: Yes Has patient had a PCN reaction causing severe rash involving mucus membranes or skin necrosis: Yes Has patient had a PCN reaction that required hospitalization Yes- went to the dr's Has patient had a PCN reaction occurring within the last 10 years: No If all of the above answers are "NO", then may proceed with Cephalosporin use. Broke out from top of head to feet.   . Tramadol Other (See Comments)    Dizziness, and altered mental statas  . Amiodarone Other (See Comments)    Pt does not remember reaction  . Atorvastatin Hives and Itching  . Bystolic [Nebivolol Hcl] Other (See Comments)    "Drops blood pressure too low"  . Cholestyramine Other (See Comments)    Pt cant remember reaction, "whatever it was, i went straight back to dr and he took me off"  . Clonidine Hydrochloride Other (See Comments)    Pt does not remember reaction  . Lansoprazole Other (See Comments)    "upset stomach"  . Oxycodone-Acetaminophen Other (See Comments)    "made me so nervous"  . Prevalite [Cholestyramine Light] Other (See Comments)    Pt does not remember reaction  . Propoxyphene Hcl Hives  . Propoxyphene N-Acetaminophen Hives    hives  . Aspirin Palpitations    Patient Measurements: Height: '5\' 6"'$  (167.6 cm) Weight: 186 lb 15.2 oz (84.8 kg) IBW/kg (Calculated) : 59.3 Heparin Dosing Weight: 80 kg  Vital Signs: Temp: 96.9 F (36.1 C) (12/13 2000) Temp Source: Oral (12/13 2000) BP: 98/72 mmHg (12/13 2200) Pulse Rate: 73 (12/13 2000)  Labs (at [facility] 12/12): WBC 3.4 Hgb  11.1 Hct  35.1 Plt  128  SCr 2.0  Recent Labs  06/21/15 0145 06/21/15 0210 06/21/15 1530 06/21/15 2140  HGB  11.0*  --   --   --   HCT 35.0*  --   --   --   PLT 111*  --   --   --   APTT  --  40* >200* >200*  LABPROT  --  16.4*  --   --   INR  --  1.31  --   --   HEPARINUNFRC  --  >2.20*  --   --   CREATININE 2.04*  --   --   --   TROPONINI 0.06*  --   --   --     Estimated Creatinine Clearance: 27.8 mL/min (by C-G formula based on Cr of 2.04).   Assessment: 69 y.o. female transferred from [facility] with ARF, h/o Afib and Eliquis on hold, for heparin.    Goal of Therapy:  PTT 66-102 while Eliquis interfering with anti-Xa level  Heparin level 0.3-0.7 units/ml Monitor platelets by anticoagulation protocol: Yes   Plan:  Hold heparin x 2 hours, then decrease heparin 1050 units/hr Check heparin level in 8 hours.   Caryl Pina 06/22/2015,12:01 AM

## 2015-06-22 NOTE — Progress Notes (Signed)
[place] for Heparin >> Eliquis Indication: atrial fibrillation  Allergies  Allergen Reactions  . Penicillins Hives, Shortness Of Breath and Itching    Has patient had a PCN reaction causing immediate rash, facial/tongue/throat swelling, SOB or lightheadedness with hypotension: Yes Has patient had a PCN reaction causing severe rash involving mucus membranes or skin necrosis: Yes Has patient had a PCN reaction that required hospitalization Yes- went to the dr's Has patient had a PCN reaction occurring within the last 10 years: No If all of the above answers are "NO", then may proceed with Cephalosporin use. Broke out from top of head to feet.   . Tramadol Other (See Comments)    Dizziness, and altered mental statas  . Amiodarone Other (See Comments)    Pt does not remember reaction  . Atorvastatin Hives and Itching  . Bystolic [Nebivolol Hcl] Other (See Comments)    "Drops blood pressure too low"  . Cholestyramine Other (See Comments)    Pt cant remember reaction, "whatever it was, i went straight back to dr and he took me off"  . Clonidine Hydrochloride Other (See Comments)    Pt does not remember reaction  . Lansoprazole Other (See Comments)    "upset stomach"  . Oxycodone-Acetaminophen Other (See Comments)    "made me so nervous"  . Prevalite [Cholestyramine Light] Other (See Comments)    Pt does not remember reaction  . Propoxyphene Hcl Hives  . Propoxyphene N-Acetaminophen Hives    hives  . Aspirin Palpitations    Patient Measurements: Height: '5\' 6"'$  (167.6 cm) Weight: 183 lb 10.3 oz (83.3 kg) IBW/kg (Calculated) : 59.3 Heparin Dosing Weight: 80 kg  Vital Signs: Temp: 97.2 F (36.2 C) (12/14 0800) Temp Source: Oral (12/14 0800) BP: 130/90 mmHg (12/14 1000) Pulse Rate: 81 (12/14 1000)  Labs (at [facility] 12/12): WBC 3.4 Hgb  11.1 Hct  35.1 Plt  128  SCr 2.0  Recent Labs  06/21/15 0145  06/21/15 0210 06/21/15 1530  06/21/15 2140 06/22/15 0532 06/22/15 1211  HGB 11.0*  --   --   --   --  10.7*  --   HCT 35.0*  --   --   --   --  33.5*  --   PLT 111*  --   --   --   --  116*  --   APTT  --   < > 40* >200* >200*  --  151*  LABPROT  --   --  16.4*  --   --   --   --   INR  --   --  1.31  --   --   --   --   HEPARINUNFRC  --   --  >2.20*  --  >2.20*  --   --   CREATININE 2.04*  --   --   --   --  2.05*  --   TROPONINI 0.06*  --   --   --   --   --   --   < > = values in this interval not displayed.  Estimated Creatinine Clearance: 27.4 mL/min (by C-G formula based on Cr of 2.05).   Assessment: 69 y.o. female transferred from [facility] with ARF, h/o Afib and Eliquis on hold, for heparin.    APTT 151 - above goal after holding x 2 hours and restarting at lower rate of 1000 units/hr. Lab drawn from port-a-cath. Heparin running in peripherally. Confirmed with  RN that no heparin has been administered via port this admit.   Called and discussed case briefly with Dr. Clementeen Graham >> ok to switch to Eliquis per home regimen.   Plts low-but up since yesterday.  H/H low-stable. No bleeding noted.   Goal of Therapy:  Monitor platelets by anticoagulation protocol: Yes   Plan:  Discontinue IV heparin.  DIscontinue heparin levels.  Restart Eliquis '5mg'$  po BID.   Sloan Leiter, PharmD, BCPS Clinical Pharmacist [phone] 06/22/2015,1:16 PM

## 2015-06-22 NOTE — Progress Notes (Signed)
CRITICAL VALUE ALERT  Critical value received:  APTT > 200  Date of notification:  12/14  Time of notification:  0000  Critical value read back: Yes  Nurse who received alert:  Remo Lipps, RN  MD notified (1st page): [place]  Time of first page: 0002  Responding MD:  Dierdre Searles  Time MD responded:  0000

## 2015-06-22 NOTE — Progress Notes (Signed)
[facility] PROGRESS NOTE  Kristy [name] BOF:[id number] DOB: 1946-03-23 DOA: 06/20/2015 PCP: [name], MD  Brief narrative 69 year old female with history of 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 fib on Coumadin, pulmonary hypertension, hyperlipidemia, anxiety, depression, obesity, GERD, hypertension, peptic ulcer disease, chronic diastolic CHF who presented to [facility] in [place] on 06/15/2015 for generalized weakness with progressive dyspnea, orthopnea and peripheral edema. On admission she she had elevated hypertension with blood pressure 226/133 with cardiomegaly and bilateral pleural effusion on chest x-ray. Her BNP was 7453 and TSH of 76. She was admitted and required BiPAP. Since most of her care was in [place] was transferred to [facility] ICU on 12/12. Patient transferred to hospitalist service on 12/14.  Assessment/Plan: Acute on chronic hypoxic and hypercapnic respiratory failure Appears to be a combination of CHF exacerbation, underlying lung cancer and uncontrolled hypothyroidism/? Myxedema. Patient maintaining O2 sat on 2 L. (Report being on 2 L at home as well). Continue IV Lasix 40 mg twice a day for now. Repeat chest x-ray shows interstitial edema with mild left-sided effusion. Since symptoms are improving both clinically and on chest x-ray) she doesn't need thoracentesis. Continue when necessary nebs. -Monitor strict I/O. -Stable for transfer to telemetry.  Acute combined systolic and diastolic CHF  Last echo from 02/2013 showed EF of 60-65%. Continue IV Lasix. Monitor strict I/O and daily weight. Will obtain 2 D echo.  Uncontrolled hypertension Blood pressure has been well controlled after her home medications (Cardizem, minoxidil and lisinopril have been resumed)  Atrial fibrillation Rate  controlled. Continue Cardizem. Verified with pharmacy , pt with pharmacy in [place], [place], patient has been on Eliquis  since 05/09/2015. Will resume. Discontinue IV heparin.  Non-small cell lung cancer stage IIIa (diagnosed in September 2016) Sees Dr. Julien Nordmann and receiving concurrent chemoradiation with weekly carboplatin and paclitaxel since 04/04/2015. Completed 4 cycles so far. Planned on repeat CT scan of the chest early next month. Will notify him.  Significant hypothyroidism TSH of 67 on admission at outside hospital. Patient reports taking her medications. Possible sick euthyroid given acute illness. Continue Synthroid. Will need to repeat labs in 6-8 weeks.  Anxiety/depression Home medications were held due to her lethargic. We'll continue to hold for now.   GERD Continue Carafate and Pepcid.  CKD stage 3   slightly worsened from baseline possibly due to diuresis. Monitor.  Deconditioning  Seen by PT and recommend SNF.  Pancytopenia Secondary to chemotherapy. Stable.  Code Status: Partial (no intubation, no CPR or defibrillator put okay with using vasoactive or antiarrhythmic agents). Family Communication: None at bedside. Disposition Plan: Transfer to telemetry.   Consultants:  PC CM  Procedures:  NONE  Antibiotics:  none  HPI/Subjective: Seen and examined. Reports feeling groggy. Breathing better.  Objective: Filed Vitals:   06/22/15 0825 06/22/15 1000  BP: 100/64 130/90  Pulse: 77 81  Temp:    Resp: 0     Intake/Output Summary (Last 24 hours) at 06/22/15 1135 Last data filed at 06/22/15 0800  Gross per 24 hour  Intake 479.88 ml  Output    675 ml  Net -195.12 ml   Filed Weights   06/21/15 0436 06/21/15 1252 06/22/15 0500  Weight: 83.3 kg (183 lb 10.3 oz) 84.8 kg (186 lb 15.2 oz) 83.3 kg (183 lb 10.3 oz)    Exam:  General:  Elderly female lying in bed not in distress, appears fatigued  HEENT: Pallor present, moist mucosa, no  JVD  Cardiovascular: S1 and S2 irregularly irregular, no murmurs rub or gallop  Respiratory: Diminished bibasilar breath sounds, no added sounds, right sided Port-A-Cath  Abdomen: Soft, nondistended, nontender, bowel sounds present  Musculoskeletal: Warm, trace edema  CNS: Alert and oriented  Data Reviewed: Basic Metabolic Panel:  Recent Labs Lab 06/21/15 0145 06/22/15 0532  NA 140 140  K 3.6 4.0  CL 95* 98*  CO2 38* 36*  GLUCOSE 123* 110*  BUN 20 26*  CREATININE 2.04* 2.05*  CALCIUM 9.5 9.5  MG 2.0 2.0  PHOS 3.2 3.2   Liver Function Tests:  Recent Labs Lab 06/21/15 0145  AST 16  ALT 10*  ALKPHOS 49  BILITOT 1.4*  PROT 5.6*  ALBUMIN 3.2*   No results for input(s): LIPASE, AMYLASE in the last 168 hours. No results for input(s): AMMONIA in the last 168 hours. CBC:  Recent Labs Lab 06/21/15 0145 06/22/15 0532  WBC 3.5* 3.1*  HGB 11.0* 10.7*  HCT 35.0* 33.5*  MCV 102.3* 103.1*  PLT 111* 116*   Cardiac Enzymes:  Recent Labs Lab 06/21/15 0145  TROPONINI 0.06*   BNP (last 3 results)  Recent Labs  06/21/15 0145  BNP 122.0*    ProBNP (last 3 results) No results for input(s): PROBNP in the last 8760 hours.  CBG: No results for input(s): GLUCAP in the last 168 hours.  Recent Results (from the past 240 hour(s))  MRSA PCR Screening     Status: Abnormal   Collection Time: 06/20/15  7:17 PM  Result Value Ref Range Status   MRSA by PCR POSITIVE (A) NEGATIVE Final    Comment:        The GeneXpert MRSA Assay (FDA approved for NASAL specimens only), is one component of a comprehensive MRSA colonization surveillance program. It is not intended to diagnose MRSA infection nor to guide or monitor treatment for MRSA infections. RESULT CALLED TO, READ BACK BY AND VERIFIED WITH: M.ANION,RN AT 2214 BY L.PITT  06/20/15      Studies: Dg Chest Port 1 View  06/22/2015  CLINICAL DATA:  Respiratory failure, shortness of breath CHF, lung malignancy.  EXAM: PORTABLE CHEST 1 VIEW COMPARISON:  Portable chest x-ray of June 20, 2015 FINDINGS: The right lungs remain borderline hypoinflated. There is persistent left lower lobe atelectasis or pneumonia with small left pleural effusion. The interstitial markings of both lungs slightly more conspicuous today. The cardiac silhouette is enlarged. The pulmonary vascularity is prominent centrally. The Port-A-Cath appliance is unchanged with its tip in the cavoatrial junction. IMPRESSION: Slight increased pulmonary interstitial markings consistent with low-grade interstitial edema. Persistent left lower lobe atelectasis or pneumonia with small left pleural effusion. Electronically Signed   By: David  [place] M.D.   On: 06/22/2015 07:04   Dg Chest Port 1 View  06/20/2015  CLINICAL DATA:  Acute onset of shortness of breath. Initial encounter. EXAM: PORTABLE CHEST 1 VIEW COMPARISON:  Chest radiograph performed 03/09/2015 FINDINGS: Small bilateral pleural effusions are suspected. Mild left basilar airspace opacity may reflect pneumonia or possibly asymmetric mild interstitial edema. No pneumothorax is seen. The cardiomediastinal silhouette is borderline enlarged. A right-sided chest port is noted ending about the cavoatrial junction. No acute osseous abnormalities are seen. IMPRESSION: Small bilateral pleural effusions suspected. Mild left basilar airspace opacity may reflect pneumonia or possibly mild asymmetric interstitial edema. Borderline cardiomegaly. Electronically Signed   By: Jacqulynn Cadet  Chang M.D.   On: 06/20/2015 23:10    Scheduled Meds: . acyclovir  200 mg Oral 5 X Daily  . Chlorhexidine Gluconate Cloth  6 each Topical Q0600  . famotidine (PEPCID) IV  20 mg Intravenous Q24H  . furosemide  40 mg Intravenous BID  . levothyroxine  75 mcg Intravenous Daily  . minoxidil  2.5 mg Oral Daily  . mupirocin ointment  1 application Nasal BID  . pantoprazole  40 mg Oral Q1200  . sucralfate  1 g Oral TID WC & HS    Continuous Infusions: . sodium chloride 10 mL/hr at 06/22/15 0700  . heparin 1,000 Units/hr (06/22/15 0700)     Time spent: 25 minutes    Priscella Donna, [place]  [facility] Pager [phone]. If 7PM-7AM, please contact night-coverage at [email or web address], password [facility] 06/22/2015, 11:35 AM  LOS: 2 days

## 2015-06-22 NOTE — Evaluation (Signed)
Physical Therapy Evaluation Patient Details Name: Kristy [name] MRN: [id number] DOB: Apr 05, 1946 Today's Date: 06/22/2015   History of Present Illness  Pt is 69 y.o. F admitted for generalized weakness, dyspnea, orthopnea, weight gain, and peripheral edema. PMHx includes CHF, Afib, renal insufficiency, HTN, thyrotoxicosis, anxiety  Clinical Impression  Pt was lethargic and unable to verbally communicate throughout session. She only occasionally opened her eyes when her name was called but did shake head to some questions. She presents with generalized weakness and decreased functional mobility. Will benefit from acute PT to improve conditioning and increase performance of functional mobility to decrease burden of care. When medically cleared, D/C to SNF is most appropriate given her current level of function and decreased caregiver support.     Follow Up Recommendations SNF    Equipment Recommendations       Recommendations for Other Services OT consult     Precautions / Restrictions Restrictions Weight Bearing Restrictions: No      Mobility  Bed Mobility Overal bed mobility: Needs Assistance Bed Mobility: Supine to Sit     Supine to sit: HOB elevated;Max assist     General bed mobility comments: Max assist for moving LEs, helicopter pivot, and trunk elevation. Pt was able to pull up to sitting with assist but needed assist with initiating movement. Max VCs for sequencing  Transfers Overall transfer level: Needs assistance Equipment used: 2 person hand held assist Transfers: Stand Pivot Transfers   Stand pivot transfers: Mod assist       General transfer comment: Mod assist for anterior translation and powering up, stability in standing. Pt was able to initiate movement better, only requiring VCs to pivot. Mod assist to control descent to sitting. Pt did not display knee buckling in standing  Ambulation/Gait                Stairs            Wheelchair  Mobility    Modified Rankin (Stroke Patients Only)       Balance Overall balance assessment: Needs assistance Sitting-balance support: Single extremity supported;Feet unsupported Sitting balance-Leahy Scale: Poor     Standing balance support: Bilateral upper extremity supported Standing balance-Leahy Scale: Poor Standing balance comment: 2 person hand held assist in standing                             Pertinent Vitals/Pain Pain Assessment: No/denies pain RR: 9 HR: 81 bpm BP: 130/90 SpO2: 100% Position: Sitting in chair at end of session    Home Living Family/patient expects to be discharged to:: Private residence Living Arrangements: Alone   Type of Home: House Home Access: Stairs to enter Entrance Stairs-Rails: Left Entrance Stairs-Number of Steps: 2-3          Prior Function                 Hand Dominance        Extremity/Trunk Assessment   Upper Extremity Assessment: Defer to OT evaluation           Lower Extremity Assessment: Generalized weakness      Cervical / Trunk Assessment: Kyphotic  Communication   Communication: Other (comment) (Pt was in/out of sleeping and unable to verbally communicate)  Cognition Arousal/Alertness: Awake/alert Behavior During Therapy: WFL for tasks assessed/performed Overall Cognitive Status: Within Functional Limits for tasks assessed  General Comments      Exercises        Assessment/Plan    PT Assessment Patient needs continued PT services  PT Diagnosis Difficulty walking;Generalized weakness   PT Problem List Decreased strength;Decreased activity tolerance;Decreased balance;Decreased mobility  PT Treatment Interventions DME instruction;Gait training;Functional mobility training;Stair training;Therapeutic exercise;Therapeutic activities;Balance training;Patient/family education   PT Goals (Current goals can be found in the Care Plan section) Acute Rehab  PT Goals PT Goal Formulation: Patient unable to participate in goal setting    Frequency Min 3X/week   Barriers to discharge Decreased caregiver support      Co-evaluation               End of Session Equipment Utilized During Treatment: Oxygen Activity Tolerance: Patient limited by lethargy Patient left: in chair;with call bell/phone within reach;with chair alarm set Nurse Communication: Mobility status;Need for lift equipment         Time: 9150-5697 PT Time Calculation (min) (ACUTE ONLY): 17 min   Charges:   PT Evaluation $Initial PT Evaluation Tier I: 1 Procedure     PT G CodesHaynes Bast 07/03/2015, 10:18 AM Haynes Bast, SPT 07-03-2015 10:27 AM I have read, reviewed and agree with student's note.   [place] [phone] (pager)

## 2015-06-23 ENCOUNTER — Inpatient Hospital Stay (HOSPITAL_COMMUNITY): Payer: Medicare Other

## 2015-06-23 DIAGNOSIS — I951 Orthostatic hypotension: Secondary | ICD-10-CM

## 2015-06-23 DIAGNOSIS — R42 Dizziness and giddiness: Secondary | ICD-10-CM

## 2015-06-23 LAB — CBC
HEMATOCRIT: 33.4 % — AB (ref 36.0–46.0)
Hemoglobin: 10.6 g/dL — ABNORMAL LOW (ref 12.0–15.0)
MCH: 32.4 pg (ref 26.0–34.0)
MCHC: 31.7 g/dL (ref 30.0–36.0)
MCV: 102.1 fL — ABNORMAL HIGH (ref 78.0–100.0)
PLATELETS: 114 10*3/uL — AB (ref 150–400)
RBC: 3.27 MIL/uL — AB (ref 3.87–5.11)
RDW: 15.1 % (ref 11.5–15.5)
WBC: 3 10*3/uL — AB (ref 4.0–10.5)

## 2015-06-23 MED ORDER — LEVOTHYROXINE SODIUM 75 MCG PO TABS
150.0000 ug | ORAL_TABLET | Freq: Every day | ORAL | Status: DC
  Administered 2015-06-24: 150 ug via ORAL
  Filled 2015-06-23: qty 2

## 2015-06-23 MED ORDER — FAMOTIDINE 20 MG PO TABS
20.0000 mg | ORAL_TABLET | Freq: Every day | ORAL | Status: DC
  Administered 2015-06-24 – 2015-06-28 (×5): 20 mg via ORAL
  Filled 2015-06-23 (×5): qty 1

## 2015-06-23 MED ORDER — FUROSEMIDE 40 MG PO TABS
40.0000 mg | ORAL_TABLET | Freq: Every day | ORAL | Status: DC
  Administered 2015-06-23 – 2015-06-28 (×6): 40 mg via ORAL
  Filled 2015-06-23 (×6): qty 1

## 2015-06-23 NOTE — NC FL2 (Signed)
[place] MEDICAID FL2 LEVEL OF CARE SCREENING TOOL     IDENTIFICATION  Patient Name: Kristy [name] Birthdate: [birth year] Sex: female Admission Date (Current Location): 06/20/2015  [facility] and [place] Number:     Facility and Address:  [facility], [place] [address] [place], [place] [address]      Provider Number: [id number]  Attending Physician Name and Address:  Louellen Molder, MD  Relative Name and Phone Number:       Current Level of Care: Hospital Recommended Level of Care: [place] Prior Approval Number:    Date Approved/Denied:   PASRR Number:    Discharge Plan: SNF    Current Diagnoses: Patient Active Problem List   Diagnosis Date Noted  . CHF exacerbation ([place]) 06/21/2015  . Hypoxia   . Thyroid activity decreased   . Essential hypertension   . Squamous cell lung cancer ([place])   . Pleural effusion   . Respiratory failure ([place])   . Chronic renal insufficiency 05/09/2015  . Hyperbilirubinemia 05/09/2015  . Peripheral edema 05/09/2015  . Abrasions of multiple sites 05/09/2015  . Antineoplastic chemotherapy induced pancytopenia ([place]) 05/09/2015  . Chemotherapeutic agent or infusion extravasation 04/18/2015  . 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 Weakness of both legs 03/01/2015  . 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 Ejection fraction   . Chronic diastolic CHF (congestive heart failure) ([place])   . Warfarin anticoagulation   . Fluid overload   . Shortness of breath   . CHEST DISCOMFORT 05/30/2009  . Cough 04/12/2009  . CAD 10/20/2008  . OBESITY 09/29/2008  . GERD 09/29/2008  . HYPERTENSION 09/29/2008  . Atrial fibrillation ([place]) 09/29/2008  . RENAL INSUFFICIENCY 09/29/2008  . VENTRICULAR HYPERTROPHY, LEFT 09/29/2008  . PULMONARY HYPERTENSION 09/29/2008  . HYPERCHOLESTEROLEMIA 09/29/2008  . ANXIETY DEPRESSION 09/29/2008  . OBESITY 09/29/2008  . GERD 09/29/2008    Orientation  RESPIRATION BLADDER Height & Weight    Self, Time, Situation, Place  O2 (3L [place]) Indwelling catheter '5\' 6"'$  (167.6 cm) 189 lbs.  BEHAVIORAL SYMPTOMS/MOOD NEUROLOGICAL BOWEL NUTRITION STATUS      Continent Diet (cardiac)  AMBULATORY STATUS COMMUNICATION OF NEEDS Skin   Extensive Assist Verbally Normal                       Personal Care Assistance Level of Assistance  Bathing, Dressing Bathing Assistance: Maximum assistance   Dressing Assistance: Maximum assistance     Functional Limitations Info             SPECIAL CARE FACTORS FREQUENCY  PT (By licensed PT), OT (By licensed OT)     PT Frequency: 5/wk OT Frequency: 5/wk            Contractures      Additional Factors Info  Code Status, Allergies, Isolation Precautions Code Status Info: Partial Allergies Info: Penicillins, Tramadol, Amiodarone, Atorvastatin, Bystolic, Cholestyramine, Clonidine Hydrochloride, Lansoprazole, Oxycodone-acetaminophen, Prevalite, Propoxyphene Hcl, Propoxyphene N-acetaminophen, Aspirin     Isolation Precautions Info: MRSA     Current Medications (06/23/2015):  This is the current hospital active medication list Current Facility-Administered Medications  Medication Dose Route Frequency Provider Last Rate Last Dose  . 0.9 %  sodium chloride infusion   Intravenous Continuous Rahul P Desai, PA-C 10 mL/hr at 06/22/15 0700    . 0.9 %  sodium chloride infusion  250 mL Intravenous PRN Rahul P Desai, PA-C      . acetaminophen (TYLENOL)  tablet 650 mg  650 mg Oral Q6H PRN Pollie Friar, PA-C   650 mg at 06/22/15 2230  . acyclovir (ZOVIRAX) 200 MG capsule 200 mg  200 mg Oral 5 X Daily Raylene Miyamoto, MD   200 mg at 06/23/15 0600  . albuterol (PROVENTIL) (2.5 MG/3ML) 0.083% nebulizer solution 2.5 mg  2.5 mg Nebulization Q3H PRN Rahul P Desai, PA-C      . apixaban (ELIQUIS) tablet 5 mg  5 mg Oral BID Priscella Mann, RPH   5 mg at 06/22/15 1739  . Chlorhexidine Gluconate Cloth 2 % PADS 6  each  6 each Topical Q0600 Raylene Miyamoto, MD   6 each at 06/23/15 0600  . famotidine (PEPCID) IVPB 20 mg premix  20 mg Intravenous Q24H Raylene Miyamoto, MD   20 mg at 06/22/15 1156  . furosemide (LASIX) tablet 40 mg  40 mg Oral Daily Nishant Dhungel, MD      . levothyroxine (SYNTHROID, LEVOTHROID) injection 75 mcg  75 mcg Intravenous Daily Rahul P Desai, PA-C   75 mcg at 06/22/15 1201  . minoxidil (LONITEN) tablet 2.5 mg  2.5 mg Oral Daily Rahul P Desai, PA-C   2.5 mg at 06/22/15 1201  . mupirocin ointment (BACTROBAN) 2 % 1 application  1 application Nasal BID Raylene Miyamoto, MD   1 application at 65/46/50 2226  . pantoprazole (PROTONIX) EC tablet 40 mg  40 mg Oral Q1200 Rahul P Desai, PA-C   40 mg at 06/22/15 1200  . sodium chloride 0.9 % injection 10-40 mL  10-40 mL Intracatheter PRN Raylene Miyamoto, MD   10 mL at 06/20/15 2050  . sucralfate (CARAFATE) tablet 1 g  1 g Oral TID WC & HS Rahul P Desai, PA-C   1 g at 06/22/15 2226     Discharge Medications: Please see discharge summary for a list of discharge medications.  Relevant Imaging Results:  Relevant Lab Results:   Additional Information SS#: [id number]  [place] Mon, [place]

## 2015-06-23 NOTE — Care Management Note (Signed)
Case Management Note Marvetta Gibbons RN, BSN Unit 2W-Case Manager [phone]  Patient Details  Name: Kristy [name] MRN: [id number] Date of Birth: May 21, 1946  Subjective/Objective:   Pt admitted with CHF                 Action/Plan: PTA pt lived at home- PT eval recommending SNF- CSW following for placement needs   Expected Discharge Date:                  Expected Discharge Plan:  Skilled Nursing Facility  In-House Referral:  Clinical Social Work  Discharge planning Services  CM Consult  Post Acute Care Choice:  NA Choice offered to:  NA  DME Arranged:    DME Agency:     HH Arranged:    [place] Agency:     Status of Service:  In process, will continue to follow  Medicare Important Message Given:    Date Medicare IM Given:    Medicare IM give by:    Date Additional Medicare IM Given:    Additional Medicare Important Message give by:     If discussed at [place] of Stay Meetings, dates discussed:    Additional Comments:  Dawayne Patricia, RN 06/23/2015, 10:49 AM

## 2015-06-23 NOTE — Progress Notes (Signed)
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 fib on Coumadin, pulmonary hypertension, hyperlipidemia, anxiety, depression, obesity, GERD, hypertension, peptic ulcer disease, chronic diastolic CHF who presented to [facility] in [place] on 06/15/2015 for generalized weakness with progressive dyspnea, orthopnea and peripheral edema. On admission she she had elevated hypertension with blood pressure 226/133 with cardiomegaly and bilateral pleural effusion on chest x-ray. Her BNP was 7453 and TSH of 76. She was admitted and required BiPAP. Since most of her care was in inspiration was transferred to [facility] ICU on 12/12. Patient transferred to hospitalist service on 12/14.  Assessment/Plan: Acute on chronic hypoxic and hypercapnic respiratory failure Appears to be a combination of CHF exacerbation, underlying lung cancer and uncontrolled hypothyroidism/? Myxedema. Patient maintaining O2 sat on 2 L. (Report being on 2 L at home as well). Diuresing well with IV lasix.. Repeat chest x-ray shows interstitial edema with mild left-sided effusion.  Continue when necessary nebs. -Monitor strict I/O.( negative balance of 1.7L) -PT recommends SNF.  Acute combined systolic and diastolic CHF  Last echo from 02/2013 showed EF of 60-65%. Continue IV Lasix. Monitor strict I/O and daily weight. Will obtain 2 D echo.  Uncontrolled hypertension Elevated BP on presentation and home meds resumed. However pt dizzy and possibly orthostatic ( was unable to stand up as she was very unsteady. orthostasis on lying to sitting up was negative.). Held all BP meds. Reduced lasix.  Atrial  fibrillation Rate controlled. Cardizem held for low BP. Resumed Eliquis  Non-small cell lung cancer stage IIIa (diagnosed in September 2016) Sees Dr. Julien Nordmann and receiving concurrent chemoradiation with weekly carboplatin and paclitaxel since 04/04/2015. Completed 4 cycles so far. Planned on repeat CT scan of the chest early next month. He was informed of pts hospitalization. i will obtain an MRI of the brain w/o contrast to r/o any metastasis given her symptoms of   dizziness and blurred vision   Significant hypothyroidism TSH of 67 on admission at outside hospital. Patient reports taking her medications. Possible sick euthyroid given acute illness. Continue Synthroid. Will need to repeat labs in 6-8 weeks.  Anxiety/depression Home medications were held due to her lethargic.   GERD Continue Carafate and Pepcid.  CKD stage 3   slightly worsened from baseline possibly due to diuresis. Monitor.  Deconditioning  Seen by PT and recommend SNF.  Pancytopenia Secondary to chemotherapy. Stable.  Code Status: Partial (no intubation, no CPR or defibrillator put okay with using vasoactive or antiarrhythmic agents).  Family Communication: None at bedside.  Disposition Plan: Transfer to telemetry.   Consultants:  PC CM  Procedures:  NONE  Antibiotics:  none  HPI/Subjective: Seen and examined. Felt dizzy and lightheaded with some blurred vision. BO was low  Objective: Filed Vitals:   06/23/15 0820 06/23/15 0823  BP: 133/74 126/86  Pulse: 84 91  Temp:    Resp:      Intake/Output Summary (Last 24 hours) at 06/23/15 1422 Last data filed at 06/23/15 0900  Gross per 24 hour  Intake    120 ml  Output    975 ml  Net   -855 ml   Filed Weights   06/21/15 1252 06/22/15 0500 06/23/15 0557  Weight: 84.8  kg (186 lb 15.2 oz) 83.3 kg (183 lb 10.3 oz) 86 kg (189 lb 9.5 oz)    Exam:   General:  Elderly female , appears fatigued  HEENT: Pallor present, moist mucosa,    Cardiovascular: S1 and S2 irregularly irregular, no murmurs rub or gallop  Respiratory: improved bibasilar breath sounds, no added sounds, right sided Port-A-Cath  Abdomen: Soft, nondistended, nontender, bowel sounds present  Musculoskeletal: Warm, trace edema  CNS: Alert and oriented  Data Reviewed: Basic Metabolic Panel:  Recent Labs Lab 06/21/15 0145 06/22/15 0532  NA 140 140  K 3.6 4.0  CL 95* 98*  CO2 38* 36*  GLUCOSE 123* 110*  BUN 20 26*  CREATININE 2.04* 2.05*  CALCIUM 9.5 9.5  MG 2.0 2.0  PHOS 3.2 3.2   Liver Function Tests:  Recent Labs Lab 06/21/15 0145  AST 16  ALT 10*  ALKPHOS 49  BILITOT 1.4*  PROT 5.6*  ALBUMIN 3.2*   No results for input(s): LIPASE, AMYLASE in the last 168 hours. No results for input(s): AMMONIA in the last 168 hours. CBC:  Recent Labs Lab 06/21/15 0145 06/22/15 0532 06/23/15 0301  WBC 3.5* 3.1* 3.0*  HGB 11.0* 10.7* 10.6*  HCT 35.0* 33.5* 33.4*  MCV 102.3* 103.1* 102.1*  PLT 111* 116* 114*   Cardiac Enzymes:  Recent Labs Lab 06/21/15 0145  TROPONINI 0.06*   BNP (last 3 results)  Recent Labs  06/21/15 0145  BNP 122.0*    ProBNP (last 3 results) No results for input(s): PROBNP in the last 8760 hours.  CBG: No results for input(s): GLUCAP in the last 168 hours.  Recent Results (from the past 240 hour(s))  MRSA PCR Screening     Status: Abnormal   Collection Time: 06/20/15  7:17 PM  Result Value Ref Range Status   MRSA by PCR POSITIVE (A) NEGATIVE Final    Comment:        The GeneXpert MRSA Assay (FDA approved for NASAL specimens only), is one component of a comprehensive MRSA colonization surveillance program. It is not intended to diagnose MRSA infection nor to guide or monitor treatment for MRSA infections. RESULT CALLED TO, READ BACK BY AND VERIFIED WITH: M.ANION,RN AT 2214 BY L.PITT  06/20/15      Studies: Dg Chest Port 1 View  06/22/2015  CLINICAL DATA:  Respiratory failure,  shortness of breath CHF, lung malignancy. EXAM: PORTABLE CHEST 1 VIEW COMPARISON:  Portable chest x-ray of June 20, 2015 FINDINGS: The right lungs remain borderline hypoinflated. There is persistent left lower lobe atelectasis or pneumonia with small left pleural effusion. The interstitial markings of both lungs slightly more conspicuous today. The cardiac silhouette is enlarged. The pulmonary vascularity is prominent centrally. The Port-A-Cath appliance is unchanged with its tip in the cavoatrial junction. IMPRESSION: Slight increased pulmonary interstitial markings consistent with low-grade interstitial edema. Persistent left lower lobe atelectasis or pneumonia with small left pleural effusion. Electronically Signed   By: David  Martinique M.D.   On: 06/22/2015 07:04    Scheduled Meds: . acyclovir  200 mg Oral 5 X Daily  . apixaban  5 mg Oral BID  . Chlorhexidine Gluconate Cloth  6 each Topical Q0600  . [START ON 06/24/2015] famotidine  20 mg Oral Daily  . furosemide  40 mg Oral Daily  . [START ON 06/24/2015] levothyroxine  150 mcg Oral QAC breakfast  . mupirocin ointment  1 application Nasal BID  . pantoprazole  40 mg Oral Q1200  . sucralfate  1  g Oral TID WC & HS   Continuous Infusions: . sodium chloride 10 mL/hr at 06/22/15 0700     Time spent: 25 minutes    Teran Daughenbaugh, [place]  [facility] Pager [phone]. If 7PM-7AM, please contact night-coverage at [email or web address], password [facility] 06/23/2015, 2:22 PM  LOS: 3 days

## 2015-06-23 NOTE — Progress Notes (Signed)
  Echocardiogram 2D Echocardiogram has been performed.  Kristy [name] M 06/23/2015, 11:41 AM

## 2015-06-23 NOTE — Clinical Social Work Note (Signed)
Clinical Social Work Assessment  Patient Details  Name: Kristy [name] MRN: [id number] Date of Birth: 01/10/1946  Date of referral:  06/23/15               Reason for consult:  Facility Placement                Permission sought to share information with:  Family Supports Permission granted to share information::  Yes, Verbal Permission Granted  Name::     Kristy [name]::  [name]  Relationship::  spouse  Contact Information:     Housing/Transportation Living arrangements for the past 2 months:  Single Family Home Source of Information:  Patient, Spouse Patient Interpreter Needed:  None Criminal Activity/Legal Involvement Pertinent to Current Situation/Hospitalization:  No - Comment as needed Significant Relationships:  Spouse Lives with:  Spouse Do you feel safe going back to the place where you live?  No Need for family participation in patient care:  Yes (Comment) (ADL)  Care giving concerns:  Pt lives with spouse at home- spouse can not provide sufficient support given current level of impairment   Facilities manager / plan:  CSW spoke with pt and pt spouse concerning PT recommendation for SNF  Employment status:  Retired Forensic scientist:  Medicare PT Recommendations:  [place] / Referral to community resources:  [employer]  Patient/Family's Response to care: Pt and spouse agreeable to SNF and recognize need for rehab prior to returning home- report pt has been greatly weakened by chemo/radiation she has endured recently and are relieved that she has gotten to stop treatment currently.  Patient/Family's Understanding of and Emotional Response to Diagnosis, Current Treatment, and Prognosis:  No questions or concerns at this time.  Emotional Assessment Appearance:  Appears stated age Attitude/Demeanor/Rapport:  Lethargic Affect (typically observed):  Appropriate, Quiet Orientation:  Oriented to Self,  Oriented to Place, Oriented to  Time, Oriented to Situation Alcohol / Substance use:  Not Applicable Psych involvement (Current and /or in the community):  No (Comment)  Discharge Needs  Concerns to be addressed:  Care Coordination Readmission within the last 30 days:  No Current discharge risk:  Physical Impairment Barriers to Discharge:  Continued Medical Work up   [employer], LCSW 06/23/2015, 5:30 PM

## 2015-06-24 ENCOUNTER — Inpatient Hospital Stay (HOSPITAL_COMMUNITY): Payer: Medicare Other

## 2015-06-24 DIAGNOSIS — I493 Ventricular premature depolarization: Secondary | ICD-10-CM

## 2015-06-24 DIAGNOSIS — R5383 Other fatigue: Secondary | ICD-10-CM

## 2015-06-24 LAB — BASIC METABOLIC PANEL
Anion gap: 10 (ref 5–15)
BUN: 25 mg/dL — AB (ref 6–20)
CALCIUM: 9.7 mg/dL (ref 8.9–10.3)
CHLORIDE: 99 mmol/L — AB (ref 101–111)
CO2: 31 mmol/L (ref 22–32)
CREATININE: 2.24 mg/dL — AB (ref 0.44–1.00)
GFR calc Af Amer: 24 mL/min — ABNORMAL LOW (ref >60)
GFR calc non Af Amer: 21 mL/min — ABNORMAL LOW (ref >60)
Glucose, Bld: 102 mg/dL — ABNORMAL HIGH (ref 65–99)
Potassium: 4.1 mmol/L (ref 3.5–5.1)
Sodium: 140 mmol/L (ref 135–145)

## 2015-06-24 LAB — URINALYSIS, ROUTINE W REFLEX MICROSCOPIC
Bilirubin Urine: NEGATIVE
GLUCOSE, UA: NEGATIVE mg/dL
Hgb urine dipstick: NEGATIVE
Ketones, ur: NEGATIVE mg/dL
Nitrite: NEGATIVE
PROTEIN: 100 mg/dL — AB
SPECIFIC GRAVITY, URINE: 1.015 (ref 1.005–1.030)
pH: 7.5 (ref 5.0–8.0)

## 2015-06-24 LAB — CBC
HEMATOCRIT: 33.4 % — AB (ref 36.0–46.0)
Hemoglobin: 10.9 g/dL — ABNORMAL LOW (ref 12.0–15.0)
MCH: 33 pg (ref 26.0–34.0)
MCHC: 32.6 g/dL (ref 30.0–36.0)
MCV: 101.2 fL — AB (ref 78.0–100.0)
PLATELETS: 122 10*3/uL — AB (ref 150–400)
RBC: 3.3 MIL/uL — ABNORMAL LOW (ref 3.87–5.11)
RDW: 14.8 % (ref 11.5–15.5)
WBC: 3.1 10*3/uL — AB (ref 4.0–10.5)

## 2015-06-24 LAB — URINE MICROSCOPIC-ADD ON

## 2015-06-24 LAB — MAGNESIUM: Magnesium: 2 mg/dL (ref 1.7–2.4)

## 2015-06-24 LAB — AMMONIA: AMMONIA: 13 umol/L (ref 9–35)

## 2015-06-24 LAB — POTASSIUM: Potassium: 4.1 mmol/L (ref 3.5–5.1)

## 2015-06-24 MED ORDER — PANTOPRAZOLE SODIUM 40 MG IV SOLR: 40.0000 mg | INTRAVENOUS | Status: DC

## 2015-06-24 MED ORDER — SALINE SPRAY 0.65 % NA SOLN
1.0000 | NASAL | Status: DC | PRN
  Filled 2015-06-24: qty 44

## 2015-06-24 MED ORDER — PANTOPRAZOLE SODIUM 40 MG IV SOLR
40.0000 mg | INTRAVENOUS | Status: DC
  Administered 2015-06-25: 40 mg via INTRAVENOUS
  Filled 2015-06-24: qty 40

## 2015-06-24 MED ORDER — LEVOTHYROXINE SODIUM 75 MCG PO TABS
175.0000 ug | ORAL_TABLET | Freq: Every day | ORAL | Status: DC
  Administered 2015-06-25 – 2015-06-28 (×4): 175 ug via ORAL
  Filled 2015-06-24 (×4): qty 1

## 2015-06-24 MED ORDER — DEXAMETHASONE SODIUM PHOSPHATE 4 MG/ML IJ SOLN
4.0000 mg | Freq: Four times a day (QID) | INTRAMUSCULAR | Status: DC
  Administered 2015-06-24 – 2015-06-28 (×14): 4 mg via INTRAVENOUS
  Filled 2015-06-24 (×15): qty 1

## 2015-06-24 NOTE — Progress Notes (Signed)
Met husband at bedside. Patient is more awake and interactive since this morning. discussed head CT findings with them. Discussed treatment plan ( steroid , chest CT etc). Discussed code status again with husband. He understands her guarded prognosis at this time and does not wish for any aggressive or heroic measures if condition deteriorates and would like to keep her comfortable if such a situation arises.   Patient  made DNR.

## 2015-06-24 NOTE — Progress Notes (Signed)
  Radiation Oncology         [phone] ________________________________  Name: Kristy [name] MRN: [id number]  Date: 06/20/2015  DOB: 1946-05-20  Chart Note:  I spoke with neurosurgery and reviewed CT.  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  Ideally, we may see some improvement in somnolence and weakness with steroids.  ________________________________  [name] Tammi Klippel, M.D.

## 2015-06-24 NOTE — Progress Notes (Signed)
Set of vital signs taken while pt was lying, sitting and standing. See flo sheet. Pt was unable to stand for 3 minutes. Reported feeling dizzy , laid back to bed, call light within reach, will continue to monitor . Oren Beckmann, RN

## 2015-06-24 NOTE — Progress Notes (Signed)
Physical Therapy Treatment Patient Details Name: Kristy [name] MRN: [id number] DOB: August 22, 1945 Today's Date: 06/24/2015    History of Present Illness Pt is 69 y.o. F admitted for generalized weakness, dyspnea, orthopnea, weight gain, and peripheral edema. PMHx includes CHF, Afib, renal insufficiency, HTN, thyrotoxicosis, anxiety    PT Comments    Patient progressing slowly towards PT goals. Continues to be lethargic with difficulty keeping eyes opened during session. Requires assist of 2 for SPT to chair. Participated in there ex. Difficulty maintaining sitting balance due to falling asleep. Able to initiate and participate better this session but still limited due to level of arousal. Appropriate for ST SNF. Will follow acutely.  Follow Up Recommendations  SNF     Equipment Recommendations       Recommendations for Other Services       Precautions / Restrictions Precautions Precautions: Fall Restrictions Weight Bearing Restrictions: No    Mobility  Bed Mobility Overal bed mobility: Needs Assistance Bed Mobility: Supine to Sit     Supine to sit: Max assist;HOB elevated     General bed mobility comments: Able to initiate LE movement with tactile/verbal cues. Assist to scoot bottom to EOB and elevate trunk. Cues for sequencing. Lethargic.  Transfers Overall transfer level: Needs assistance Equipment used: Rolling walker (2 wheeled) Transfers: Sit to/from [facility] Sit to Stand: Mod assist;+2 physical assistance Stand pivot transfers: Mod assist;+2 physical assistance       General transfer comment: Mod assist for anterior translation and to boost up with cues for hand placement. Pt with anterior lean and difficulty keeping eyes open. VCs to pivot to chair with assist managing RW. Bil knee instability noted. Eye closed entire time.   Ambulation/Gait                 Stairs            Wheelchair Mobility    Modified Rankin (Stroke  Patients Only)       Balance Overall balance assessment: Needs assistance Sitting-balance support: Feet supported;Single extremity supported Sitting balance-Leahy Scale: Poor Sitting balance - Comments: Able to sit unsupported for 10 seconds at a time then with LOB to the Right but appropriate balanca reactions noted. Min A for support due to lethargy and falling asleep.   Standing balance support: During functional activity Standing balance-Leahy Scale: Poor Standing balance comment: Mod A for standing.                     Cognition Arousal/Alertness: Lethargic Behavior During Therapy: WFL for tasks assessed/performed Overall Cognitive Status: Difficult to assess                      Exercises General Exercises - Lower Extremity Ankle Circles/Pumps: Both;15 reps;Seated Long Arc Quad: Both;20 reps;Seated Hip ABduction/ADduction: Both;15 reps;Seated Toe Raises: Both;10 reps;Seated Heel Raises: Both;10 reps;Seated    General Comments General comments (skin integrity, edema, etc.): Spouse present during session.      Pertinent Vitals/Pain Pain Assessment: No/denies pain    Home Living                      Prior Function            PT Goals (current goals can now be found in the care plan section) Progress towards PT goals: Progressing toward goals    Frequency  Min 3X/week    PT Plan Current plan remains appropriate    Co-evaluation  End of Session Equipment Utilized During Treatment: Oxygen Activity Tolerance: Patient limited by lethargy Patient left: in chair;with call bell/phone within reach;with family/visitor present     Time: 3552-1747 PT Time Calculation (min) (ACUTE ONLY): 19 min  Charges:  $Therapeutic Activity: 8-22 mins                    G Codes:      Octavia Mottola A Yanice Maqueda 06/24/2015, 11:50 AM Wray Kearns, [employer], DPT [phone]

## 2015-06-24 NOTE — Progress Notes (Signed)
CSW spoke with Dr. Clementeen Graham re: patient's disposition for the weekend. Currently- patient is scheduled for d/c to [facility] in [place] when medically stable.  Arrangements have been completed by unit CSW prior to today.  Per MD- patient is not stable for d/c and may actually require more of a hospice type care as her condition has deteriorated. He plans to talk to patient's husband re: Code Status etc..  He will consider d/c on Monday should her condition improve.  CSW services will continue to monitor.  Lorie Phenix. Pauline Good, [place]

## 2015-06-24 NOTE — Care Management Important Message (Signed)
Important Message  Patient Details  Name: ZHURI KRASS MRN: [id number] Date of Birth: 1946-05-26   Medicare Important Message Given:  Yes    Dawayne Patricia, RN 06/24/2015, 3:42 PM

## 2015-06-24 NOTE — Progress Notes (Signed)
  Radiation Oncology         [phone] ________________________________  Name: Kristy [name] MRN: [id number]  Date: 06/20/2015  DOB: 17-Jul-1945  Chart Note:  I reviewed this patient's most recent findings and wanted to take a minute to document my impression.  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 If her performance status and overall condition is stable, it may be beneficial to consider brain MRI and possible radiotherapy to the pons.  However, I suspect the most important goal at this time is to optimize her CHF and medical stability, while we determine how the pontine lesion is influencing her overall condition.  If we determine that she has been rendered to a very poor performance status level, the question will be whether to aggressively treat the brain, or move toward comfort care.  It may be too early to make this determination.  Factors which play into the upcoming decisions may include restaging the chest with CT.  I would recommend chest CT for re-staging.  The disease status in the chest may help determine next steps.  ________________________________  [name]. Tammi Klippel, M.D.

## 2015-06-24 NOTE — Progress Notes (Signed)
Utilization review completed.  

## 2015-06-24 NOTE — Progress Notes (Signed)
[facility] PROGRESS NOTE  Kristy [name] QHU:[id number] DOB: 03-29-1946 DOA: 06/20/2015 PCP: [name], MD  Brief narrative 69 year old female with history of 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 fib on Coumadin, pulmonary hypertension, hyperlipidemia, anxiety, depression, obesity, GERD, hypertension, peptic ulcer disease, chronic diastolic CHF who presented to [facility] in [place] on 06/15/2015 for generalized weakness with progressive dyspnea, orthopnea and peripheral edema. On admission she she had elevated hypertension with blood pressure 226/133 with cardiomegaly and bilateral pleural effusion on chest x-ray. Her BNP was 7453 and TSH of 76. She was admitted and required BiPAP. Since most of her care was in inspiration was transferred to [facility] ICU on 12/12. Patient transferred to hospitalist service on 12/14.  Assessment/Plan: Acute on chronic hypoxic and hypercapnic respiratory failure Appears to be a combination of CHF exacerbation, underlying lung cancer and uncontrolled hypothyroidism/? Myxedema. Patient maintaining O2 sat on 2 L. (Report being on 2 L at home as well). Diuresing well with IV lasix.. Repeat chest x-ray shows interstitial edema with mild left-sided effusion.  Continue when necessary nebs. -Monitor strict I/O.( negative balance of 1.7L) -PT recommends SNF.  Acute combined systolic and diastolic CHF  Last echo from 02/2013 showed EF of 60-65%. Continue daily Lasix. Monitor strict I/O and daily weight. 2-D echo shows LVH with normal EF of 55-60% no wall motion abnormality..  Uncontrolled hypertension Elevated BP on presentation and home meds resumed. However pt dizzy and possibly orthostatic ( was unable to stand up as she was very unsteady. All her blood pressure meds were held. Lasix dose  reduced. Recheck orthostasis today.  Weakness and lethargy  patient falling asleep during conversation. Also given symptoms of dizziness I ordered an MRI of her brain to rule out for metastases but patient refused as she was very claustrophobic even with medication. Significant hypothyroidism may be contributing to it as well. Will check head CT and ammonia level. Check UA.  Atrial fibrillation Rate controlled. Cardizem held for low BP. Patient has multiple PVCs on monitor. Check potassium and magnesium. Resumed Eliquis  Non-small cell lung cancer stage IIIa (diagnosed in September 2016) Sees Dr. Julien Nordmann and receiving concurrent chemoradiation with weekly carboplatin and paclitaxel since 04/04/2015. Completed 4 cycles so far. Planned on repeat CT scan of the chest early next month. He was informed of pts hospitalization. Ordered MRI of the brain given symptoms of dizziness and blurred vision but patient refused. Reports her dizziness to be better today.   Significant hypothyroidism TSH of 60 on admission. Low free T4 as well.. Patient reports taking her medications. Possible sick euthyroid given acute illness. Continue Synthroid (dose increased to 175 g). Will need to repeat labs in 6-8 weeks.  Anxiety/depression Home medications held due to her lethargic.   GERD Continue Carafate and Pepcid.  CKD stage 3   slightly worsened from baseline possibly due to diuresis. Recheck in a.m.  Deconditioning  Seen by PT and recommend SNF.  Pancytopenia Secondary to chemotherapy. Stable.  Code Status: Partial (no intubation, no CPR or defibrillator put okay with using vasoactive or antiarrhythmic agents).  Family Communication: None at bedside. Spoke with husband on 12/15  Disposition Plan: Continue telemetry monitoring. Skilled nursing facility possibly on 12/19.   Consultants:  PC CM  Procedures:  NONE  Antibiotics:  none  HPI/Subjective: Seen and examined. Still has some  dizziness but better than yesterday. Denies blurred vision. Patient fell asleep during conversation.  Objective: Filed Vitals:   06/24/15 0441 06/24/15 0934  BP: 114/70 111/59  Pulse: 88 74  Temp: 97.5 F (36.4 C) 97.9 F (36.6 C)  Resp: 18 18    Intake/Output Summary (Last 24 hours) at 06/24/15 1059 Last data filed at 06/23/15 1429  Gross per 24 hour  Intake    240 ml  Output    275 ml  Net    -35 ml   Filed Weights   06/22/15 0500 06/23/15 0557 06/24/15 0441  Weight: 83.3 kg (183 lb 10.3 oz) 86 kg (189 lb 9.5 oz) 86 kg (189 lb 9.5 oz)    Exam:   General:  Elderly female , appears fatigued, fell asleep during conversation  HEENT: Pallor present, moist mucosa,   Cardiovascular: S1 and S2 irregularly irregular, no murmurs rub or gallop  Respiratory: improved bibasilar breath sounds, no added sounds, right sided Port-A-Cath  Abdomen: Soft, nondistended, nontender, bowel sounds present  Musculoskeletal: Warm, trace edema  CNS: lethargic  Data Reviewed: Basic Metabolic Panel:  Recent Labs Lab 06/21/15 0145 06/22/15 0532  NA 140 140  K 3.6 4.0  CL 95* 98*  CO2 38* 36*  GLUCOSE 123* 110*  BUN 20 26*  CREATININE 2.04* 2.05*  CALCIUM 9.5 9.5  MG 2.0 2.0  PHOS 3.2 3.2   Liver Function Tests:  Recent Labs Lab 06/21/15 0145  AST 16  ALT 10*  ALKPHOS 49  BILITOT 1.4*  PROT 5.6*  ALBUMIN 3.2*   No results for input(s): LIPASE, AMYLASE in the last 168 hours. No results for input(s): AMMONIA in the last 168 hours. CBC:  Recent Labs Lab 06/21/15 0145 06/22/15 0532 06/23/15 0301 06/24/15 0530  WBC 3.5* 3.1* 3.0* 3.1*  HGB 11.0* 10.7* 10.6* 10.9*  HCT 35.0* 33.5* 33.4* 33.4*  MCV 102.3* 103.1* 102.1* 101.2*  PLT 111* 116* 114* 122*   Cardiac Enzymes:  Recent Labs Lab 06/21/15 0145  TROPONINI 0.06*   BNP (last 3 results)  Recent Labs  06/21/15 0145  BNP 122.0*    ProBNP (last 3 results) No results for input(s): PROBNP in the last  8760 hours.  CBG: No results for input(s): GLUCAP in the last 168 hours.  Recent Results (from the past 240 hour(s))  MRSA PCR Screening     Status: Abnormal   Collection Time: 06/20/15  7:17 PM  Result Value Ref Range Status   MRSA by PCR POSITIVE (A) NEGATIVE Final    Comment:        The GeneXpert MRSA Assay (FDA approved for NASAL specimens only), is one component of a comprehensive MRSA colonization surveillance program. It is not intended to diagnose MRSA infection nor to guide or monitor treatment for MRSA infections. RESULT CALLED TO, READ BACK BY AND VERIFIED WITH: M.ANION,RN AT 2214 BY L.PITT  06/20/15      Studies: No results found.  Scheduled Meds: . acyclovir  200 mg Oral 5 X Daily  . apixaban  5 mg Oral BID  . Chlorhexidine Gluconate Cloth  6 each Topical Q0600  . famotidine  20 mg Oral Daily  . furosemide  40 mg Oral Daily  . levothyroxine  150 mcg Oral QAC breakfast  . mupirocin ointment  1 application Nasal BID  . pantoprazole  40 mg Oral Q1200  . sucralfate  1 g Oral TID WC & HS   Continuous Infusions: . sodium chloride 10 mL/hr at 06/22/15 0700  Time spent: 25 minutes    Kristy [name]  [facility] Pager [phone]. If 7PM-7AM, please contact night-coverage at [email or web address], password [facility] 06/24/2015, 10:59 AM  LOS: 4 days

## 2015-06-24 NOTE — Progress Notes (Addendum)
Cystic lesion in the pons with associated hemorrhage reported on CT head. Discussed with radiation oncology Dr Tammi Klippel, who has spoken with neurosurgery. Recommends tostart decadron. Also recommends CT chest for restaging. This is probably the reason of her increased somnolence and lethargic. Given hemorrhagic brain metastases oral prognosis may be poor and if not improved significantly with steroid will need to consider palliative care. I will discuss this with patient and her husband. CT chest without contrast ordered for restaging.

## 2015-06-25 ENCOUNTER — Inpatient Hospital Stay (HOSPITAL_COMMUNITY): Payer: Medicare Other

## 2015-06-25 DIAGNOSIS — J90 Pleural effusion, not elsewhere classified: Secondary | ICD-10-CM

## 2015-06-25 DIAGNOSIS — I613 Nontraumatic intracerebral hemorrhage in brain stem: Secondary | ICD-10-CM

## 2015-06-25 DIAGNOSIS — C7931 Secondary malignant neoplasm of brain: Secondary | ICD-10-CM

## 2015-06-25 LAB — CBC
HCT: 33.6 % — ABNORMAL LOW (ref 36.0–46.0)
Hemoglobin: 11.5 g/dL — ABNORMAL LOW (ref 12.0–15.0)
MCH: 33.8 pg (ref 26.0–34.0)
MCHC: 34.2 g/dL (ref 30.0–36.0)
MCV: 98.8 fL (ref 78.0–100.0)
PLATELETS: 122 10*3/uL — AB (ref 150–400)
RBC: 3.4 MIL/uL — ABNORMAL LOW (ref 3.87–5.11)
RDW: 14.6 % (ref 11.5–15.5)
WBC: 3.1 10*3/uL — AB (ref 4.0–10.5)

## 2015-06-25 MED ORDER — HYDRALAZINE HCL 20 MG/ML IJ SOLN: 5.0000 mg | Freq: Four times a day (QID) | INTRAMUSCULAR | Status: DC | PRN

## 2015-06-25 MED ORDER — PANTOPRAZOLE SODIUM 40 MG PO TBEC
40.0000 mg | DELAYED_RELEASE_TABLET | Freq: Every day | ORAL | Status: DC
  Administered 2015-06-26 – 2015-06-28 (×3): 40 mg via ORAL
  Filled 2015-06-25 (×3): qty 1

## 2015-06-25 NOTE — Progress Notes (Signed)
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 fib on Coumadin, pulmonary hypertension, hyperlipidemia, anxiety, depression, obesity, GERD, hypertension, peptic ulcer disease, chronic diastolic CHF who presented to [facility] in [place] on 06/15/2015 for generalized weakness with progressive dyspnea, orthopnea and peripheral edema. On admission she she had elevated hypertension with blood pressure 226/133 with cardiomegaly and bilateral pleural effusion on chest x-ray. Her BNP was 7453 and TSH of 76. She was admitted and required BiPAP. Since most of her care was in inspiration was transferred to [facility] ICU on 12/12. Patient transferred to hospitalist service on 12/14.  Assessment/Plan: Acute on chronic hypoxic and hypercapnic respiratory failure Appears to be a combination of CHF exacerbation, underlying lung cancer and uncontrolled hypothyroidism/? Myxedema. Patient maintaining O2 sat on 2 L. (Report being on 2 L at home as well). Diuresing well with IV lasix.. Repeat chest x-ray shows interstitial edema with mild left-sided effusion.  Continue when necessary nebs. -PT recommends SNF.  Acute combined systolic and diastolic CHF  Last echo from 02/2013 showed EF of 60-65%. Continue daily Lasix. Monitor strict I/O and daily weight. 2-D echo shows LVH with normal EF of 55-60% no wall motion abnormality..  Weakness and lethargy Possibly a combination of pontine hemorrhage and? Myxedema. Patient more alert and awake today.  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 Julien Nordmann and receiving concurrent  chemoradiation with weekly carboplatin and paclitaxel since 04/04/2015. Completed 4 cycles so far. Planned on repeat CT scan of the chest early next month. He was informed of pts hospitalization. Ordered MRI of the brain given symptoms of dizziness and blurred vision but patient refused. Head CT done showed cystic lesion in the pons with associated hemorrhage. Discussed with radiation oncology Dr Tammi Klippel, who has spoken with neurosurgery. Decadron started as per recommendation.. Also recommends CT chest for restaging. This is probably the reason of her increased somnolence and lethargic. Given hemorrhagic brain metastases  prognosis may be poor and if not improved significantly with steroid will need to consider palliative care. Discussed plan with patient and her husband. CT chest without contrast ordered for restaging. Shows new bilateral pleural effusions moderate on the left and small on the right with small pericardial effusion. Decreasing size of the right lower lobe malignancy and mediastinal lymph node. -since her dyspnea is better, i will hold off on thoracetesis at this time.     Uncontrolled hypertension Elevated BP on presentation and home meds resumed. However pt dizzy and possibly orthostatic ( was unable to stand up as she was very unsteady. All her blood pressure meds were held. Lasix dose reduced.     Atrial fibrillation Rate controlled. Cardizem held for low BP. Patient has multiple PVCs on monitor. Check potassium and magnesium. Eliquis discontinued given pontine hemorrhage.     Significant hypothyroidism TSH of 60 on admission. Low free T4 as well.. Patient reports taking her medications. Possible sick euthyroid given acute illness. Continue Synthroid (dose increased to 175 g). Will need to repeat labs in 6-8 weeks.  Anxiety/depression Home medications held due to her lethargic.   GERD Continue Carafate and Pepcid.  CKD stage 3   slightly worsened from baseline  possibly due to diuresis. Recheck in a.m.  Deconditioning  Seen by PT and recommend SNF.  Pancytopenia Secondary to chemotherapy. Stable.  Code Status: DO NOT RESUSCITATE after discussing with patient and his wife, may need palliative care consult if condition deteriorates.   Family Communication: Husband at bedside  Disposition Plan: Continue telemetry monitoring. Skilled nursing facility if clinically improved   Consultants:  PC CM  Procedures:  NONE  Antibiotics:  none  HPI/Subjective: Seen and examined. Reports feeling less dizzy today. More awake.  Objective: Filed Vitals:   06/25/15 0404 06/25/15 0444  BP: 148/91 152/95  Pulse:  96  Temp:  97.4 F (36.3 C)  Resp:  18    Intake/Output Summary (Last 24 hours) at 06/25/15 1356 Last data filed at 06/25/15 0914  Gross per 24 hour  Intake    240 ml  Output    500 ml  Net   -260 ml   Filed Weights   06/23/15 0557 06/24/15 0441 06/25/15 0402  Weight: 86 kg (189 lb 9.5 oz) 86 kg (189 lb 9.5 oz) 85.9 kg (189 lb 6 oz)    Exam:   General:  Elderly female, appears fatigued,   HEENT: Pallor present, moist mucosa,   Cardiovascular: S1 and S2 irregularly irregular, no murmurs rub or gallop  Respiratory: improved bibasilar breath sounds, no added sounds, right sided Port-A-Cath  Abdomen: Soft, nondistended, nontender, bowel sounds present  Musculoskeletal: Warm, trace edema  CNS: awake and alert ,   Data Reviewed: Basic Metabolic Panel:  Recent Labs Lab 06/21/15 0145 06/22/15 0532 06/24/15 1037 06/24/15 1230  NA 140 140  --  140  K 3.6 4.0 4.1 4.1  CL 95* 98*  --  99*  CO2 38* 36*  --  31  GLUCOSE 123* 110*  --  102*  BUN 20 26*  --  25*  CREATININE 2.04* 2.05*  --  2.24*  CALCIUM 9.5 9.5  --  9.7  MG 2.0 2.0 2.0  --   PHOS 3.2 3.2  --   --    Liver Function Tests:  Recent Labs Lab 06/21/15 0145  AST 16  ALT 10*  ALKPHOS 49  BILITOT 1.4*  PROT 5.6*  ALBUMIN 3.2*   No results  for input(s): LIPASE, AMYLASE in the last 168 hours.  Recent Labs Lab 06/24/15 1037  AMMONIA 13   CBC:  Recent Labs Lab 06/21/15 0145 06/22/15 0532 06/23/15 0301 06/24/15 0530 06/25/15 0551  WBC 3.5* 3.1* 3.0* 3.1* 3.1*  HGB 11.0* 10.7* 10.6* 10.9* 11.5*  HCT 35.0* 33.5* 33.4* 33.4* 33.6*  MCV 102.3* 103.1* 102.1* 101.2* 98.8  PLT 111* 116* 114* 122* 122*   Cardiac Enzymes:  Recent Labs Lab 06/21/15 0145  TROPONINI 0.06*   BNP (last 3 results)  Recent Labs  06/21/15 0145  BNP 122.0*    ProBNP (last 3 results) No results for input(s): PROBNP in the last 8760 hours.  CBG: No results for input(s): GLUCAP in the last 168 hours.  Recent Results (from the past 240 hour(s))  MRSA PCR Screening     Status: Abnormal   Collection Time: 06/20/15  7:17 PM  Result Value Ref Range Status   MRSA by PCR POSITIVE (A) NEGATIVE Final    Comment:        The GeneXpert MRSA Assay (FDA approved for NASAL specimens only), is one component of a comprehensive MRSA colonization surveillance program. It is not intended to diagnose MRSA infection nor to guide or monitor treatment  for MRSA infections. RESULT CALLED TO, READ BACK BY AND VERIFIED WITH: M.ANION,RN AT 2214 BY L.PITT  06/20/15      Studies: Ct Head Wo Contrast  06/24/2015  CLINICAL DATA:  Dizziness.  History of lung cancer. EXAM: CT HEAD WITHOUT CONTRAST TECHNIQUE: Contiguous axial images were obtained from the base of the skull through the vertex without intravenous contrast. COMPARISON:  Unenhanced MRI head 03/28/2015 FINDINGS: Hypodensity in the central pons. This was present previously and may be slightly larger but difficult to compare. There is hyperdensity in the posterior aspect of the pons which is most consistent with recent hemorrhage. There is a small amount of hemorrhage in the lesion previously. Generalized atrophy. Chronic microvascular ischemic changes in the white matter. No definite acute infarct.  Partially calcified 1 cm lesion in the lateral posterior fossa on the right. This is consistent with meningioma and is unchanged from the prior MRI. Negative for skull lesion. IMPRESSION: Cystic lesion in the pons with associated hemorrhage. This is concerning for metastatic disease with hemorrhage. Unfortunately, the patient has renal insufficiency and intravenous contrast for CT or MRI is not possible at this time. 1 cm calcified meningioma in the right posterior fossa Atrophy and chronic microvascular ischemia. I discussed findings by telephone with Dr. Tammi Klippel Electronically Signed   By: Franchot Gallo M.D.   On: 06/24/2015 11:51   Ct Chest Wo Contrast  06/25/2015  CLINICAL DATA:  69 year old female with acute shortness of breath and chest pain for 2 days. Known lung cancer. EXAM: CT CHEST WITHOUT CONTRAST TECHNIQUE: Multidetector CT imaging of the chest was performed following the standard protocol without IV contrast. COMPARISON:  03/08/2015 PET-CT. 06/22/2015 and prior chest radiographs. FINDINGS: Mediastinum/Nodes: Cardiomegaly and coronary artery/aortic atherosclerotic calcifications are again noted. Small pericardial effusion is noted. No enlarged lymph nodes are identified. The previously described enlarged mediastinal lymph nodes are now all normal size. Lungs/Pleura: The right lower lobe mass has decreased in size, now measuring 2 x 3.6 cm (image 32), previously 3.8 x 5.7 cm. New bilateral pleural effusions are identified, moderate on the left and small on the right. There is mild central ground-glass opacities which could represent mild interstitial edema. Bibasilar atelectasis is identified, moderate on the left. There is no evidence of pneumothorax. Upper abdomen: No acute abnormalities. Patient is status post cholecystectomy. Musculoskeletal: No acute or suspicious abnormalities. A healing fracture of the anterior right sixth rib is noted. IMPRESSION: New bilateral pleural effusions, moderate on  the left and small on the right, and small pericardial effusion. Moderate left basilar atelectasis. Mild central ground-glass opacities may represent mild interstitial edema. Decreased size of known right lower lobe malignancy, now measuring 2 x 3.6 cm, previously 3.8 x 5.7 cm. Decreased size of mediastinal lymph nodes, now normal in size. Cardiomegaly, coronary disease and aortic atherosclerosis. Electronically Signed   By: Margarette Canada M.D.   On: 06/25/2015 09:30    Scheduled Meds: . acyclovir  200 mg Oral 5 X Daily  . Chlorhexidine Gluconate Cloth  6 each Topical Q0600  . dexamethasone  4 mg Intravenous 4 times per day  . famotidine  20 mg Oral Daily  . furosemide  40 mg Oral Daily  . levothyroxine  175 mcg Oral QAC breakfast  . mupirocin ointment  1 application Nasal BID  . pantoprazole (PROTONIX) IV  40 mg Intravenous Q24H  . sucralfate  1 g Oral TID WC & HS   Continuous Infusions: . sodium chloride 10 mL/hr at 06/22/15 0700  Time spent: 25 minutes    Louellen Molder  [facility] Pager [phone]. If 7PM-7AM, please contact night-coverage at [email or web address], password [facility] 06/25/2015, 1:56 PM  LOS: 5 days

## 2015-06-26 LAB — CBC
HCT: 31.7 % — ABNORMAL LOW (ref 36.0–46.0)
Hemoglobin: 10.4 g/dL — ABNORMAL LOW (ref 12.0–15.0)
MCH: 32.3 pg (ref 26.0–34.0)
MCHC: 32.8 g/dL (ref 30.0–36.0)
MCV: 98.4 fL (ref 78.0–100.0)
PLATELETS: 130 10*3/uL — AB (ref 150–400)
RBC: 3.22 MIL/uL — ABNORMAL LOW (ref 3.87–5.11)
RDW: 14.4 % (ref 11.5–15.5)
WBC: 3.9 10*3/uL — AB (ref 4.0–10.5)

## 2015-06-26 LAB — BASIC METABOLIC PANEL
Anion gap: 9 (ref 5–15)
BUN: 38 mg/dL — AB (ref 6–20)
CALCIUM: 9.9 mg/dL (ref 8.9–10.3)
CO2: 32 mmol/L (ref 22–32)
Chloride: 96 mmol/L — ABNORMAL LOW (ref 101–111)
Creatinine, Ser: 2.32 mg/dL — ABNORMAL HIGH (ref 0.44–1.00)
GFR calc Af Amer: 23 mL/min — ABNORMAL LOW (ref >60)
GFR, EST NON AFRICAN AMERICAN: 20 mL/min — AB (ref >60)
GLUCOSE: 145 mg/dL — AB (ref 65–99)
Potassium: 3.9 mmol/L (ref 3.5–5.1)
SODIUM: 137 mmol/L (ref 135–145)

## 2015-06-26 NOTE — Progress Notes (Signed)
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 fib on Coumadin, pulmonary hypertension, hyperlipidemia, anxiety, depression, obesity, GERD, hypertension, peptic ulcer disease, chronic diastolic CHF who presented to [facility] in [place] on 06/15/2015 for generalized weakness with progressive dyspnea, orthopnea and peripheral edema. On admission she she had elevated hypertension with blood pressure 226/133 with cardiomegaly and bilateral pleural effusion on chest x-ray. Her BNP was 7453 and TSH of 76. She was admitted and required BiPAP. Since most of her care was in inspiration was transferred to [facility] ICU on 12/12. Patient transferred to hospitalist service on 12/14.  Assessment/Plan: Acute on chronic hypoxic and hypercapnic respiratory failure Appears to be a combination of CHF exacerbation, underlying lung cancer and uncontrolled hypothyroidism/? Myxedema. Patient maintaining O2 sat on 2 L. (Report being on 2 L at home as well). Diuresed well. Now on oral lasix only. Continue when necessary nebs. -PT recommends SNF.  Acute combined systolic and diastolic CHF  Last echo from 02/2013 showed EF of 60-65%. Continue daily Lasix. Monitor strict I/O and daily weight. 2-D echo shows LVH with normal EF of 55-60% no wall motion abnormality..    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 Julien Nordmann and receiving concurrent chemoradiation with weekly carboplatin and paclitaxel since 04/04/2015. Completed 4 cycles so far. Planned on repeat CT scan of the chest early next month. He was informed of pts  hospitalization. Ordered MRI of the brain given symptoms of dizziness and blurred vision but patient refused. Head CT done showed cystic lesion in the pons with associated hemorrhage. Discussed with radiation oncology Dr Tammi Klippel, who has spoken with neurosurgery. Decadron started as per recommendation.. Also recommends CT chest for restaging. This is probably the reason of her increased somnolence and lethargic. Given hemorrhagic brain metastases  prognosis may be poor and if not improved significantly with steroid will need to consider palliative care. Discussed plan with patient and her husband. CT chest without contrast ordered for restaging. Shows new bilateral pleural effusions moderate on the left and small on the right with small pericardial effusion. Decreasing size of the right lower lobe malignancy and mediastinal lymph node. -patient continues to be dizzy and weak and  also reports some blurred vision today. prognosis seems guarded. i will monitor for any improvement in the next 24 hrs , if no changes or worsens will d/w with consult palliative care team. Will inform  Drs Julien Nordmann and Tammi Klippel in am.     Uncontrolled hypertension Elevated BP on presentation and home meds resumed. However pt dizzy and possibly orthostatic ( was unable to stand up as she was very unsteady. All her blood pressure meds were held. Lasix dose reduced.   Weakness and lethargy Possibly a combination of pontine hemorrhage and? Myxedema. Symptoms worsened today.  Atrial fibrillation Rate controlled. Cardizem held for low BP. Patient has multiple PVCs on monitor. replenish potassium and magnesium. Eliquis discontinued given pontine hemorrhage.   Significant hypothyroidism TSH of 60 on admission. Low free T4 as well.. Patient reports taking her medications. Possible sick euthyroid given acute illness. Continue Synthroid (dose increased to 175 g). Will need to repeat labs in 6-8 weeks.  Anxiety/depression Home  medications held due to her lethargy.   GERD Continue Carafate and Pepcid.  CKD stage 3   slightly worsened from baseline possibly due to diuresis.   Deconditioning  Seen by PT and recommend SNF.  Pancytopenia Secondary to chemotherapy. Stable.  Code Status: DO NOT RESUSCITATE , plan on palliative care if condition deteriorates. Family Communication: Husband at bedside  Disposition Plan: Continue telemetry monitoring. Skilled nursing facility if clinically improved   Consultants:  PC CM  Procedures:  NONE  Antibiotics:  none  HPI/Subjective: Seen and examined. Reports feeling more dizzy with blurred vision today dizzy today.   Objective: Filed Vitals:   06/25/15 1948 06/26/15 0426  BP: 142/82 159/91  Pulse: 76 87  Temp: 98.4 F (36.9 C) 97.6 F (36.4 C)  Resp: 18 18    Intake/Output Summary (Last 24 hours) at 06/26/15 1104 Last data filed at 06/26/15 0427  Gross per 24 hour  Intake    360 ml  Output    500 ml  Net   -140 ml   Filed Weights   06/24/15 0441 06/25/15 0402 06/26/15 0426  Weight: 86 kg (189 lb 9.5 oz) 85.9 kg (189 lb 6 oz) 87.7 kg (193 lb 5.5 oz)    Exam:   General:  Elderly female, more fatigued   HEENT: Pallor present, moist mucosa,   Cardiovascular: S1 and S2 irregularly irregular, no murmurs rub or gallop  Respiratory: improved bibasilar breath sounds, no added sounds, right sided Port-A-Cath  Abdomen: Soft, nondistended, nontender, bowel sounds present  Musculoskeletal: Warm, trace edema  CNS: awake and alert , lethargic  Data Reviewed: Basic Metabolic Panel:  Recent Labs Lab 06/21/15 0145 06/22/15 0532 06/24/15 1037 06/24/15 1230 06/26/15 0556  NA 140 140  --  140 137  K 3.6 4.0 4.1 4.1 3.9  CL 95* 98*  --  99* 96*  CO2 38* 36*  --  31 32  GLUCOSE 123* 110*  --  102* 145*  BUN 20 26*  --  25* 38*  CREATININE 2.04* 2.05*  --  2.24* 2.32*  CALCIUM 9.5 9.5  --  9.7 9.9  MG 2.0 2.0 2.0  --   --   PHOS 3.2 3.2   --   --   --    Liver Function Tests:  Recent Labs Lab 06/21/15 0145  AST 16  ALT 10*  ALKPHOS 49  BILITOT 1.4*  PROT 5.6*  ALBUMIN 3.2*   No results for input(s): LIPASE, AMYLASE in the last 168 hours.  Recent Labs Lab 06/24/15 1037  AMMONIA 13   CBC:  Recent Labs Lab 06/22/15 0532 06/23/15 0301 06/24/15 0530 06/25/15 0551 06/26/15 0556  WBC 3.1* 3.0* 3.1* 3.1* 3.9*  HGB 10.7* 10.6* 10.9* 11.5* 10.4*  HCT 33.5* 33.4* 33.4* 33.6* 31.7*  MCV 103.1* 102.1* 101.2* 98.8 98.4  PLT 116* 114* 122* 122* 130*   Cardiac Enzymes:  Recent Labs Lab 06/21/15 0145  TROPONINI 0.06*   BNP (last 3 results)  Recent Labs  06/21/15 0145  BNP 122.0*    ProBNP (last 3 results) No results for input(s): PROBNP in the last 8760 hours.  CBG: No results for input(s): GLUCAP in the last 168 hours.  Recent Results (from the past 240 hour(s))  MRSA PCR Screening     Status: Abnormal   Collection Time: 06/20/15  7:17 PM  Result Value Ref Range Status   MRSA by PCR POSITIVE (A) NEGATIVE Final    Comment:  The GeneXpert MRSA Assay (FDA approved for NASAL specimens only), is one component of a comprehensive MRSA colonization surveillance program. It is not intended to diagnose MRSA infection nor to guide or monitor treatment for MRSA infections. RESULT CALLED TO, READ BACK BY AND VERIFIED WITH: M.ANION,RN AT 2214 BY L.PITT  06/20/15      Studies: Ct Head Wo Contrast  06/24/2015  CLINICAL DATA:  Dizziness.  History of lung cancer. EXAM: CT HEAD WITHOUT CONTRAST TECHNIQUE: Contiguous axial images were obtained from the base of the skull through the vertex without intravenous contrast. COMPARISON:  Unenhanced MRI head 03/28/2015 FINDINGS: Hypodensity in the central pons. This was present previously and may be slightly larger but difficult to compare. There is hyperdensity in the posterior aspect of the pons which is most consistent with recent hemorrhage. There is a  small amount of hemorrhage in the lesion previously. Generalized atrophy. Chronic microvascular ischemic changes in the white matter. No definite acute infarct. Partially calcified 1 cm lesion in the lateral posterior fossa on the right. This is consistent with meningioma and is unchanged from the prior MRI. Negative for skull lesion. IMPRESSION: Cystic lesion in the pons with associated hemorrhage. This is concerning for metastatic disease with hemorrhage. Unfortunately, the patient has renal insufficiency and intravenous contrast for CT or MRI is not possible at this time. 1 cm calcified meningioma in the right posterior fossa Atrophy and chronic microvascular ischemia. I discussed findings by telephone with Dr. Tammi Klippel Electronically Signed   By: Franchot Gallo M.D.   On: 06/24/2015 11:51   Ct Chest Wo Contrast  06/25/2015  CLINICAL DATA:  69 year old female with acute shortness of breath and chest pain for 2 days. Known lung cancer. EXAM: CT CHEST WITHOUT CONTRAST TECHNIQUE: Multidetector CT imaging of the chest was performed following the standard protocol without IV contrast. COMPARISON:  03/08/2015 PET-CT. 06/22/2015 and prior chest radiographs. FINDINGS: Mediastinum/Nodes: Cardiomegaly and coronary artery/aortic atherosclerotic calcifications are again noted. Small pericardial effusion is noted. No enlarged lymph nodes are identified. The previously described enlarged mediastinal lymph nodes are now all normal size. Lungs/Pleura: The right lower lobe mass has decreased in size, now measuring 2 x 3.6 cm (image 32), previously 3.8 x 5.7 cm. New bilateral pleural effusions are identified, moderate on the left and small on the right. There is mild central ground-glass opacities which could represent mild interstitial edema. Bibasilar atelectasis is identified, moderate on the left. There is no evidence of pneumothorax. Upper abdomen: No acute abnormalities. Patient is status post cholecystectomy.  Musculoskeletal: No acute or suspicious abnormalities. A healing fracture of the anterior right sixth rib is noted. IMPRESSION: New bilateral pleural effusions, moderate on the left and small on the right, and small pericardial effusion. Moderate left basilar atelectasis. Mild central ground-glass opacities may represent mild interstitial edema. Decreased size of known right lower lobe malignancy, now measuring 2 x 3.6 cm, previously 3.8 x 5.7 cm. Decreased size of mediastinal lymph nodes, now normal in size. Cardiomegaly, coronary disease and aortic atherosclerosis. Electronically Signed   By: Margarette Canada M.D.   On: 06/25/2015 09:30    Scheduled Meds: . dexamethasone  4 mg Intravenous 4 times per day  . famotidine  20 mg Oral Daily  . furosemide  40 mg Oral Daily  . levothyroxine  175 mcg Oral QAC breakfast  . pantoprazole  40 mg Oral Daily  . sucralfate  1 g Oral TID WC & HS   Continuous Infusions: . sodium chloride 10 mL/hr at  06/22/15 0700     Time spent: 25 minutes    Kristy [name]  [facility] Pager [phone]. If 7PM-7AM, please contact night-coverage at [email or web address], password [facility] 06/26/2015, 11:04 AM  LOS: 6 days

## 2015-06-27 DIAGNOSIS — C7801 Secondary malignant neoplasm of right lung: Secondary | ICD-10-CM

## 2015-06-27 DIAGNOSIS — Z515 Encounter for palliative care: Secondary | ICD-10-CM

## 2015-06-27 DIAGNOSIS — E032 Hypothyroidism due to medicaments and other exogenous substances: Secondary | ICD-10-CM

## 2015-06-27 LAB — CBC
HCT: 31.7 % — ABNORMAL LOW (ref 36.0–46.0)
HEMOGLOBIN: 10.7 g/dL — AB (ref 12.0–15.0)
MCH: 33.3 pg (ref 26.0–34.0)
MCHC: 33.8 g/dL (ref 30.0–36.0)
MCV: 98.8 fL (ref 78.0–100.0)
Platelets: 147 10*3/uL — ABNORMAL LOW (ref 150–400)
RBC: 3.21 MIL/uL — ABNORMAL LOW (ref 3.87–5.11)
RDW: 14.4 % (ref 11.5–15.5)
WBC: 5.5 10*3/uL (ref 4.0–10.5)

## 2015-06-27 NOTE — Progress Notes (Signed)
CSW informed by palliative care that pt and pt husband would like to stick with plan for DC to [facility] [phone]) in [place] with palliative services following at the facility.  Will need to fax DC summary to [phone]  Transport can be provided by [facility] or by [employer] [phone]  CSW confirmed plan with facility.  CSW will continue to follow- potential DC 12/20 to SNF  Domenica Reamer, [place] Social Worker [phone]

## 2015-06-27 NOTE — Progress Notes (Signed)
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 fib on Coumadin, pulmonary hypertension, hyperlipidemia, anxiety, depression, obesity, GERD, hypertension, peptic ulcer disease, chronic diastolic CHF who presented to [facility] in [place] on 06/15/2015 for generalized weakness with progressive dyspnea, orthopnea and peripheral edema. On admission she she had elevated hypertension with blood pressure 226/133 with cardiomegaly and bilateral pleural effusion on chest x-ray. Her BNP was 7453 and TSH of 76. She was admitted and required BiPAP. Since most of her care was in inspiration was transferred to [facility] ICU on 12/12. Patient transferred to hospitalist service on 12/14.  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 Julien Nordmann and receiving concurrent chemoradiation with weekly carboplatin and paclitaxel since 04/04/2015. Completed 4 cycles so far. Planned on repeat CT scan of the chest early next month.  MRI of the brain ordered for symptoms of dizziness and blurred vision but patient refused feeling claustrophobic. Head CT done showed cystic lesion in the pons with associated hemorrhage. Discussed with radiation oncology Dr Tammi Klippel, who has spoken with neurosurgery. Decadron started as per recommendation.. Also recommends CT chest for restaging which shows decrease in the size of right lower lobe malignancy and mediastinal lymph node.. -Given hemorrhagic brain metastases  prognosis may be poor and if not improved significantly with steroid will need to consider palliative care. Her symptoms  has not improved and remains lethargic and dizzy. Discussed plan patient's husband at bedside who understands patient's poor prognosis and agrees with palliative care discussion for possible hospice. -I spoke with Dr. Julien Nordmann today who agrees with palliative approach given her metastatic pontine hemorrhage and poor prognosis. Also spoke with Dr. Lisbeth Renshaw who is covering Dr. Tammi Klippel today and he agrees with the same.   Acute on chronic hypoxic and hypercapnic respiratory failure Appears to be a combination of CHF exacerbation, underlying lung cancer and uncontrolled hypothyroidism/? Myxedema. Patient maintaining O2 sat on 2 L. (Report being on 2 L at home as well). Diuresed well. Now on oral lasix only. Continue when necessary nebs. -PT recommends SNF.  Acute combined systolic and diastolic CHF  Last echo from 02/2013 showed EF of 60-65%. Continue daily Lasix. Monitor strict I/O and daily weight. 2-D echo shows LVH with normal EF of 55-60% no wall motion abnormality..      Uncontrolled hypertension Elevated BP on presentation and home meds resumed. Her pressure medications held due to dizziness and possible orthostasis.  Weakness and lethargy Possibly a combination of pontine hemorrhage and? Myxedema. Symptoms unimproved.  Atrial fibrillation Rate controlled. Cardizem held for low BP. Patient has multiple PVCs on monitor. replenished potassium and magnesium. Eliquis discontinued given pontine hemorrhage.   Significant hypothyroidism TSH of 60 on admission. Low free T4 as well.. Patient reports taking her medications. Possible sick euthyroid given acute illness. Continue Synthroid (dose increased to 175 g).   Anxiety/depression Home medications held due to her lethargy.   GERD Continue Carafate and Pepcid.  CKD stage 3   slightly worsened from baseline possibly due to diuresis.   Deconditioning  Seen by PT and recommend SNF.  Pancytopenia Secondary to chemotherapy.  Stable.  Code Status: DO NOT RESUSCITATE , palliative care consult.  Family Communication: Husband at bedside  Disposition Plan: May need home versus residential hospice after discussion with palliative care   Consultants:  PC CM  Spoke with Dr. Julien Nordmann and Dr. Tammi Klippel  Procedures:  NONE  Antibiotics:  none  HPI/Subjective: Seen and examined. Remains lethargic. Reports off and on dizziness.  Objective: Filed Vitals:   06/26/15 2012 06/27/15 0415  BP: 153/99 138/90  Pulse: 70 87  Temp: 98.1 F (36.7 C) 97.4 F (36.3 C)  Resp: 18 18    Intake/Output Summary (Last 24 hours) at 06/27/15 1150 Last data filed at 06/27/15 0800  Gross per 24 hour  Intake    540 ml  Output    800 ml  Net   -260 ml   Filed Weights   06/25/15 0402 06/26/15 0426 06/27/15 0415  Weight: 85.9 kg (189 lb 6 oz) 87.7 kg (193 lb 5.5 oz) 86.6 kg (190 lb 14.7 oz)    Exam:   General:  Elderly female, somnolent  HEENT: , moist mucosa,   Cardiovascular: S1 and S2 irregularly irregular, no murmurs rub or gallop  Respiratory: improved bibasilar breath sounds, no added sounds, right sided Port-A-Cath  Abdomen: Soft, nondistended, nontender, bowel sounds present  Musculoskeletal: Warm, trace edema  CNS: awake and alert , lethargic and somnolent  Data Reviewed: Basic Metabolic Panel:  Recent Labs Lab 06/21/15 0145 06/22/15 0532 06/24/15 1037 06/24/15 1230 06/26/15 0556  NA 140 140  --  140 137  K 3.6 4.0 4.1 4.1 3.9  CL 95* 98*  --  99* 96*  CO2 38* 36*  --  31 32  GLUCOSE 123* 110*  --  102* 145*  BUN 20 26*  --  25* 38*  CREATININE 2.04* 2.05*  --  2.24* 2.32*  CALCIUM 9.5 9.5  --  9.7 9.9  MG 2.0 2.0 2.0  --   --   PHOS 3.2 3.2  --   --   --    Liver Function Tests:  Recent Labs Lab 06/21/15 0145  AST 16  ALT 10*  ALKPHOS 49  BILITOT 1.4*  PROT 5.6*  ALBUMIN 3.2*   No results for input(s): LIPASE, AMYLASE in the last 168 hours.  Recent Labs Lab 06/24/15 1037   AMMONIA 13   CBC:  Recent Labs Lab 06/23/15 0301 06/24/15 0530 06/25/15 0551 06/26/15 0556 06/27/15 0435  WBC 3.0* 3.1* 3.1* 3.9* 5.5  HGB 10.6* 10.9* 11.5* 10.4* 10.7*  HCT 33.4* 33.4* 33.6* 31.7* 31.7*  MCV 102.1* 101.2* 98.8 98.4 98.8  PLT 114* 122* 122* 130* 147*   Cardiac Enzymes:  Recent Labs Lab 06/21/15 0145  TROPONINI 0.06*   BNP (last 3 results)  Recent Labs  06/21/15 0145  BNP 122.0*    ProBNP (last 3 results) No results for input(s): PROBNP in the last 8760 hours.  CBG: No results for input(s): GLUCAP in the last 168 hours.  Recent Results (from the past 240 hour(s))  MRSA PCR Screening     Status: Abnormal   Collection Time: 06/20/15  7:17 PM  Result Value Ref Range Status   MRSA by PCR POSITIVE (A) NEGATIVE Final    Comment:        The GeneXpert MRSA Assay (FDA approved for NASAL specimens only), is one component of a comprehensive MRSA colonization surveillance program. It is not intended to diagnose MRSA infection nor to guide or monitor treatment for MRSA infections. RESULT CALLED TO, READ BACK BY AND VERIFIED WITH: M.ANION,RN AT 2214 BY L.PITT  06/20/15      Studies: No results found.  Scheduled Meds: . dexamethasone  4 mg Intravenous 4 times per day  . famotidine  20 mg Oral Daily  . furosemide  40 mg Oral Daily  . levothyroxine  175 mcg Oral QAC breakfast  . pantoprazole  40 mg Oral Daily  . sucralfate  1 g Oral TID WC & HS   Continuous Infusions: . sodium chloride 10 mL/hr at 06/22/15 0700     Time spent: 25 minutes    Zoriyah Scheidegger, [place]  [facility] Pager [phone]. If 7PM-7AM, please contact night-coverage at [email or web address], password [facility] 06/27/2015, 11:50 AM  LOS: 7 days

## 2015-06-27 NOTE — Progress Notes (Signed)
Utilization review completed.  

## 2015-06-28 DIAGNOSIS — I5031 Acute diastolic (congestive) heart failure: Secondary | ICD-10-CM | POA: Diagnosis present

## 2015-06-28 DIAGNOSIS — R5383 Other fatigue: Secondary | ICD-10-CM | POA: Diagnosis present

## 2015-06-28 DIAGNOSIS — R42 Dizziness and giddiness: Secondary | ICD-10-CM

## 2015-06-28 DIAGNOSIS — C7800 Secondary malignant neoplasm of unspecified lung: Secondary | ICD-10-CM | POA: Diagnosis present

## 2015-06-28 DIAGNOSIS — J9601 Acute respiratory failure with hypoxia: Secondary | ICD-10-CM

## 2015-06-28 DIAGNOSIS — Z515 Encounter for palliative care: Secondary | ICD-10-CM

## 2015-06-28 DIAGNOSIS — D61810 Antineoplastic chemotherapy induced pancytopenia: Secondary | ICD-10-CM

## 2015-06-28 DIAGNOSIS — I613 Nontraumatic intracerebral hemorrhage in brain stem: Secondary | ICD-10-CM | POA: Diagnosis present

## 2015-06-28 LAB — CBC
HEMATOCRIT: 27.2 % — AB (ref 36.0–46.0)
Hemoglobin: 9.1 g/dL — ABNORMAL LOW (ref 12.0–15.0)
MCH: 32.9 pg (ref 26.0–34.0)
MCHC: 33.5 g/dL (ref 30.0–36.0)
MCV: 98.2 fL (ref 78.0–100.0)
PLATELETS: 129 10*3/uL — AB (ref 150–400)
RBC: 2.77 MIL/uL — ABNORMAL LOW (ref 3.87–5.11)
RDW: 14.4 % (ref 11.5–15.5)
WBC: 4 10*3/uL (ref 4.0–10.5)

## 2015-06-28 MED ORDER — MORPHINE SULFATE (CONCENTRATE) 10 MG/0.5ML PO SOLN
2.5000 mg | ORAL | Status: DC | PRN
Start: 2015-06-28 — End: 2015-06-28

## 2015-06-28 MED ORDER — LEVOTHYROXINE SODIUM 175 MCG PO TABS: 175.0000 ug | ORAL_TABLET | Freq: Every day | ORAL | Status: AC

## 2015-06-28 MED ORDER — SENNOSIDES-DOCUSATE SODIUM 8.6-50 MG PO TABS: 1.0000 | ORAL_TABLET | Freq: Every evening | ORAL | Status: DC | PRN

## 2015-06-28 NOTE — Progress Notes (Signed)
Called [facility], spoke with Aurelio Brash, admission coordinator. States patient has not yet arrived. Informed her that her porta cath still accessed and recommended de-access on arrival. Stated that she or another RN would gladly take care of de-accessing her porta cath.

## 2015-06-28 NOTE — Consult Note (Signed)
Consultation Note Date: 06/28/2015   Patient Name: Kristy [name]  DOB: 10-03-1945  MRN: [id number]  Age / Sex: 69 y.o., female  PCP: [name], MD Referring Physician: Louellen Molder, MD  Reason for Consultation: Establishing goals of care    Clinical Assessment/Narrative: I had a long conversation with Mr. & Mrs. Rosekrans today. They are both very much aware that she is approaching end of life. Mr. Liszewski is very tearful but hopeful that they still have some time left. Mrs. Rudnick is in a very good place as she tells me that she has no concerns, she is not afraid to die, and she is thankful for so many good years that she has had. She is not regretful and she relies on her faith for strength and reassurance. They tell me that their goals are definitely for comfort and QOL at this time and they want her to be closer to home in [place], [place] because she was a part of the [employer] and has a large support group and friends that she would like to visit with during this time. Many of their friends are unable to get to [facility] to visit but their son does live here. Her priorities are to visit with her friends and family during this time. Mr. Pontius wants her to go to [facility] as this is only ~3 miles from his home and this will enable her to be close to her family/friends - Mrs. Fruin is relying on her husband to make decisions. We discussed the idea and philosophy of rehab vs hospice. They are very much open to hospice support and have had good experience with hospice in the past with friends/family. However, Mr. Barletta has his heart on trying some PT I believe with the thought that she may be more mobile. We discussed her limitations in achieving this. I believe they understand but we agree to discharge to [facility] rehab with palliative and then transition to hospice there. They are requesting discharge  tomorrow. They agree that we should focus on comfort care and utilizing her time to visit more with her loved ones instead of coming back to the hospital.   Contacts/Participants in Discussion: Primary Decision Maker: Self   Relationship to Patient Husband next  [place] - Discharge tomorrow to [facility] with palliative and goal to transition to hospice care there.  - Considered meclizine 12.5 mg daily but fear this could make her more lethargic and interfere with her goal of visiting with loved ones.   Code Status/Advance Care Planning: DNR    Code Status Orders        Start     Ordered   06/24/15 1627  Do not attempt resuscitation (DNR)   Continuous    Question Answer Comment  In the event of cardiac or respiratory ARREST Do not call a "code blue"   In the event of cardiac or respiratory ARREST Do not perform Intubation, CPR, defibrillation or ACLS   In the event of cardiac or respiratory ARREST Use medication by any route, position, wound care, and other measures to relive pain and suffering. May use oxygen, suction and manual treatment of airway obstruction as needed for comfort.      06/24/15 1626      Symptom Management:   Consider meclizine for dizziness.  Constipation: Senokot-S qhs.  Denies pain/discomfort. Would give roxanol 2.5-5 mg every 4 hours prn for pain/SOB with goal of comfort at discharge.   Palliative  Prophylaxis:   Bowel Regimen, Frequent Pain Assessment and Oral Care   Psycho-social/Spiritual:  Support System: Strong Desire for further Chaplaincy support:no Additional Recommendations: Caregiving  Support/Resources, Education on Hospice and Grief/Bereavement Support  Prognosis: < 6 months. Likely more into weeks.   Discharge Planning: [facility] for rehab with Palliative care service follow-up   Chief Complaint/ Primary Diagnoses: Present on Admission:  . Thyroid activity decreased . Essential  hypertension . Squamous cell lung cancer ([place]) . Pleural effusion . Acute diastolic CHF (congestive heart failure) ([place]) . Acute respiratory failure with hypoxia ([place]) . Lethargy . Metastatic lung carcinoma ([place]) . squamous cell Cancer of lower lobe of right lung . Pontine hemorrhage ([place]) . OBESITY . GERD . Antineoplastic chemotherapy induced pancytopenia ([place])  I have reviewed the medical record, interviewed the patient and family, and examined the patient. The following aspects are pertinent.  Past Medical History  Diagnosis Date  . Atrial fibrillation ([place]) 09/29/2008    Paroxysmal, amiodarone therapy with cardioversion to sinus rhythm in the past. / cardioversion on higher dose amiodarone successful October, 2010 / amiodarone stopped or pulmonary illness November, 2010, patient improved, amiodarone not restarted / regular rhythm January, 2011 / returned atrial fibrillation by March, 2011... plan rate control  . RENAL INSUFFICIENCY 09/29/2008    Creatinine 1.9  . VENTRICULAR HYPERTROPHY, LEFT 09/29/2008  . PULMONARY HYPERTENSION 09/29/2008  . ALLERGIC RHINITIS 09/29/2008  . HYPERCHOLESTEROLEMIA 09/29/2008  . ANXIETY DEPRESSION 09/29/2008  . OBESITY 09/29/2008  . GERD 09/29/2008  . HYPERTENSION 09/29/2008    No echo data as of April, 2011 ( normal LV function by history)  . PEPTIC ULCER DISEASE 09/29/2008  . THYROTOXICOSIS 09/29/2008  . CHEST DISCOMFORT 05/30/2009    In October,2010  ,??GI??  . CAD 10/20/2008    Catheterization [facility] 2001, 70% circumflex / nuclear April, 2010 no ischemia, not gated with atrial fib  . Cough 04/12/2009    Her lisinopril held October, 2010, cough resolved  . Warfarin anticoagulation   . Peptic ulcer disease   . Fluid overload     Improvement with Zaroxolyn July, 2011  . Shortness of breath     Amiodarone will not be restarted  /     Pulmonary illness November, 2010, abnormal CT, sedimentation rate not elevated, Dr Melvyn Novas saw patient,, no desaturation  walking April, 2011, plan to follow  . Chronic diastolic CHF (congestive heart failure) ([place])   . Ejection fraction   . Cancer [facility])     Lung Cancer   Social History   Social History  . Marital Status: Married    Spouse Name: N/A  . Number of Children: N/A  . Years of Education: N/A   Social History Main Topics  . Smoking status: Former Smoker -- 1.00 packs/day for 25 years    Quit date: 07/09/1990  . Smokeless tobacco: None  . Alcohol Use: No  . Drug Use: No  . Sexual Activity: Not Asked   Other Topics Concern  . None   Social History Narrative   Family History  Problem Relation Age of Onset  . Breast cancer Maternal Aunt   . Lung cancer Mother     heavy smoker  . Heart disease Father   . Heart disease Maternal Grandfather    Scheduled Meds: . dexamethasone  4 mg Intravenous 4 times per day  . famotidine  20 mg Oral Daily  . furosemide  40 mg Oral Daily  . levothyroxine  175 mcg Oral QAC breakfast  .  pantoprazole  40 mg Oral Daily  . sucralfate  1 g Oral TID WC & HS   Continuous Infusions: . sodium chloride 10 mL/hr at 06/22/15 0700   PRN Meds:.sodium chloride, acetaminophen, albuterol, hydrALAZINE, sodium chloride, sodium chloride Medications Prior to Admission:  Prior to Admission medications   Medication Sig Start Date End Date Taking? Authorizing Provider  albuterol (PROVENTIL HFA;VENTOLIN HFA) 108 (90 BASE) MCG/ACT inhaler Inhale 2 puffs into the lungs every 6 (six) hours as needed for wheezing or shortness of breath.   Yes Historical Provider, MD  allopurinol (ZYLOPRIM) 300 MG tablet Take 300 mg by mouth daily after supper.    Yes Historical Provider, MD  apixaban (ELIQUIS) 5 MG TABS tablet Take 10 mg (2 tabs) PO BID x 7 days; then decrease to 5 mg (1 tab) PO BID. Patient taking differently: Take 5 mg by mouth 2 (two) times daily.  05/09/15  Yes Susanne Borders, NP  diltiazem (CARDIZEM CD) 240 MG 24 hr capsule Take 1 capsule (240 mg total) by mouth daily.  12/08/12  Yes Carlena Bjornstad, MD  docusate sodium (COLACE) 100 MG capsule Take 100 mg by mouth daily as needed for mild constipation.   Yes Historical Provider, MD  furosemide (LASIX) 80 MG tablet Take 40 mg by mouth daily as needed for fluid or edema (ankle swelling).   Yes Historical Provider, MD  HYDROcodone-acetaminophen (NORCO/VICODIN) 5-325 MG tablet Take 1 tablet by mouth every 6 (six) hours as needed for moderate pain.   Yes Historical Provider, MD  latanoprost (XALATAN) 0.005 % ophthalmic solution Place 1 drop into both eyes at bedtime. 06/09/15  Yes Historical Provider, MD  lidocaine-prilocaine (EMLA) cream Apply to port 1-2 hours before accessing Patient taking differently: Apply 1 application topically See admin instructions. Apply to port 1-2 hours before accessing 05/02/15  Yes Curt Bears, MD  lisinopril (PRINIVIL,ZESTRIL) 40 MG tablet Take 40 mg by mouth daily.    Yes Historical Provider, MD  LORazepam (ATIVAN) 0.5 MG tablet Take 0.5 mg by mouth at bedtime as needed for anxiety or sleep.    Yes Historical Provider, MD  omeprazole (PRILOSEC) 20 MG capsule Take 20 mg by mouth daily.    Yes Historical Provider, MD  Polyethyl Glycol-Propyl Glycol (SYSTANE OP) Place 1 drop into both eyes at bedtime.   Yes Historical Provider, MD  potassium chloride SA (K-DUR,KLOR-CON) 20 MEQ tablet Take 20 mEq by mouth daily.   Yes Historical Provider, MD  prochlorperazine (COMPAZINE) 10 MG tablet Take 1 tablet (10 mg total) by mouth every 6 (six) hours as needed for nausea or vomiting. 03/24/15  Yes Curt Bears, MD  ranitidine (ZANTAC) 150 MG tablet Take 150 mg by mouth daily.  05/14/15  Yes Historical Provider, MD  sucralfate (CARAFATE) 1 G tablet Take 1 tablet (1 g total) by mouth 4 (four) times daily. 04/29/15  Yes Kyung Rudd, MD  traMADol (ULTRAM) 50 MG tablet Take 1 tablet (50 mg total) by mouth every 6 (six) hours as needed. Patient taking differently: Take 50 mg by mouth every 6 (six) hours as  needed for moderate pain.  03/24/15  Yes Curt Bears, MD  traZODone (DESYREL) 50 MG tablet Take 50 mg by mouth at bedtime as needed for sleep.  12/11/12  Yes Historical Provider, MD  levothyroxine (SYNTHROID, LEVOTHROID) 175 MCG tablet Take 1 tablet (175 mcg total) by mouth daily after supper. 06/28/15   Nishant Dhungel, MD  warfarin (COUMADIN) 5 MG tablet Take 5 mg by mouth  daily before supper.    Historical Provider, MD   Allergies  Allergen Reactions  . Penicillins Hives, Shortness Of Breath and Itching    Has patient had a PCN reaction causing immediate rash, facial/tongue/throat swelling, SOB or lightheadedness with hypotension: Yes Has patient had a PCN reaction causing severe rash involving mucus membranes or skin necrosis: Yes Has patient had a PCN reaction that required hospitalization Yes- went to the dr's Has patient had a PCN reaction occurring within the last 10 years: No If all of the above answers are "NO", then may proceed with Cephalosporin use. Broke out from top of head to feet.   . Tramadol Other (See Comments)    Dizziness, and altered mental statas  . Amiodarone Other (See Comments)    Pt does not remember reaction  . Atorvastatin Hives and Itching  . Bystolic [Nebivolol Hcl] Other (See Comments)    "Drops blood pressure too low"  . Cholestyramine Other (See Comments)    Pt cant remember reaction, "whatever it was, i went straight back to dr and he took me off"  . Clonidine Hydrochloride Other (See Comments)    Pt does not remember reaction  . Lansoprazole Other (See Comments)    "upset stomach"  . Oxycodone-Acetaminophen Other (See Comments)    "made me so nervous"  . Prevalite [Cholestyramine Light] Other (See Comments)    Pt does not remember reaction  . Propoxyphene Hcl Hives  . Propoxyphene N-Acetaminophen Hives    hives  . Aspirin Palpitations    Review of Systems  Constitutional: Positive for fatigue.  Neurological: Positive for dizziness and  weakness.    Physical Exam  Constitutional: She is oriented to person, place, and time. She appears well-developed.  HENT:  Head: Normocephalic and atraumatic.  Cardiovascular: Normal rate.   Respiratory: Effort normal. No accessory muscle usage. No tachypnea. No respiratory distress.  GI: Soft. Normal appearance. There is no tenderness.  Neurological: She is alert and oriented to person, place, and time.  Response somewhat delayed at times.     Vital Signs: BP 150/102 mmHg  Pulse 87  Temp(Src) 97.6 F (36.4 C) (Oral)  Resp 16  Ht '5\' 6"'$  (1.676 m)  Wt 86 kg (189 lb 9.5 oz)  BMI 30.62 kg/m2  SpO2 90%  SpO2: SpO2: 90 % O2 Device:SpO2: 90 % O2 Flow Rate: .O2 Flow Rate (L/min): 2 L/min  IO: Intake/output summary:  Intake/Output Summary (Last 24 hours) at 06/28/15 0845 Last data filed at 06/28/15 0543  Gross per 24 hour  Intake     10 ml  Output    175 ml  Net   -165 ml    LBM: Last BM Date: 06/26/15 Baseline Weight: Weight: 83.3 kg (183 lb 10.3 oz) Most recent weight: Weight: 86 kg (189 lb 9.5 oz)      Palliative Assessment/Data:  Flowsheet Rows        Most Recent Value   Intake Tab    Referral Department  Hospitalist   Unit at Time of Referral  Cardiac/Telemetry Unit   Palliative Care Primary Diagnosis  Cancer   Date Notified  06/27/15   Palliative Care Type  New Palliative care   Reason for referral  Clarify Goals of Care, Counsel Regarding Hospice   Date of Admission  06/20/15   # of days IP prior to Palliative referral  7   Clinical Assessment    Psychosocial & Spiritual Assessment    Palliative Care Outcomes  Additional Data Reviewed:  CBC:    Component Value Date/Time   WBC 4.0 06/28/2015 0400   WBC 1.6* 05/30/2015 0951   HGB 9.1* 06/28/2015 0400   HGB 9.8* 05/30/2015 0951   HCT 27.2* 06/28/2015 0400   HCT 29.6* 05/30/2015 0951   PLT 129* 06/28/2015 0400   PLT 145 05/30/2015 0951   MCV 98.2 06/28/2015 0400   MCV 100.4 05/30/2015 0951    NEUTROABS 1.1* 05/30/2015 0951   NEUTROABS 5.1 04/27/2015 1350   LYMPHSABS 0.2* 05/30/2015 0951   LYMPHSABS 0.1* 04/27/2015 1350   MONOABS 0.3 05/30/2015 0951   MONOABS 0.1 04/27/2015 1350   EOSABS 0.0 05/30/2015 0951   EOSABS 0.0 04/27/2015 1350   BASOSABS 0.0 05/30/2015 0951   BASOSABS 0.0 04/27/2015 1350   Comprehensive Metabolic Panel:    Component Value Date/Time   NA 137 06/26/2015 0556   NA 141 05/30/2015 0951   K 3.9 06/26/2015 0556   K 4.3 05/30/2015 0951   CL 96* 06/26/2015 0556   CO2 32 06/26/2015 0556   CO2 23 05/30/2015 0951   BUN 38* 06/26/2015 0556   BUN 19.1 05/30/2015 0951   CREATININE 2.32* 06/26/2015 0556   CREATININE 1.8* 05/30/2015 0951   GLUCOSE 145* 06/26/2015 0556   GLUCOSE 84 05/30/2015 0951   CALCIUM 9.9 06/26/2015 0556   CALCIUM 9.3 05/30/2015 0951   AST 16 06/21/2015 0145   AST 12 05/30/2015 0951   ALT 10* 06/21/2015 0145   ALT <9 05/30/2015 0951   ALKPHOS 49 06/21/2015 0145   ALKPHOS 57 05/30/2015 0951   BILITOT 1.4* 06/21/2015 0145   BILITOT 1.38* 05/30/2015 0951   PROT 5.6* 06/21/2015 0145   PROT 5.5* 05/30/2015 0951   ALBUMIN 3.2* 06/21/2015 0145   ALBUMIN 3.3* 05/30/2015 0951     Time In: 1430 Time Out: 1600 Time Total: 80mn Greater than 50%  of this time was spent counseling and coordinating care related to the above assessment and plan.  Signed by: PPershing Proud NP  APershing Proud NP  [id number] 8:45 AM  Please contact Palliative Medicine Team phone at [phone] questions and concerns.

## 2015-06-28 NOTE — Care Management Important Message (Signed)
Important Message  Patient Details  Name: Kristy [name] MRN: [id number] Date of Birth: 1945/09/05   Medicare Important Message Given:  Yes    Dawayne Patricia, RN 06/28/2015, 11:03 AM

## 2015-06-28 NOTE — Discharge Summary (Addendum)
Physician Discharge Summary  Kristy [name] WCH:[id number] DOB: Nov 10, 1945 DOA: 06/20/2015  PCP: [name], MD  Admit date: 06/20/2015 Discharge date: 06/28/2015  Time spent: 35 minutes  Recommendations for Outpatient Follow-up:  Discharge to skilled nursing facility in [place] with palliative care follow up. prognosis is extremely guarded. She will eventually need residential hospice.  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   Antineoplastic chemotherapy induced pancytopenia (HCC)   Thyroid activity decreased   Essential hypertension   Pleural effusion   Acute respiratory failure with hypoxia (HCC)   Atrial fibrillation    OBESITY   GERD    Discharge Condition: guairded  Diet recommendation: regular  Code status: DNR  Filed Weights   06/26/15 0426 06/27/15 0415 06/28/15 0537  Weight: 87.7 kg (193 lb 5.5 oz) 86.6 kg (190 lb 14.7 oz) 86 kg (189 lb 9.5 oz)    History of present illness:  Please refer to admission H&P for details, in brief,69 year old female with history of 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 fib on Coumadin, pulmonary hypertension, hyperlipidemia, anxiety, depression, obesity, GERD, hypertension, peptic ulcer disease, chronic diastolic CHF who presented to [facility] in [place] on 06/15/2015 for generalized weakness with progressive dyspnea, orthopnea and peripheral edema. On admission she she had elevated hypertension with blood pressure 226/133 with cardiomegaly and bilateral pleural effusion on chest x-ray. Her BNP was 7453 and TSH of 76.  She was admitted and required BiPAP. Since most of her care was in inspiration was transferred to [facility] ICU on 12/12. Patient transferred to hospitalist service on 12/14. Hospital course prolonged and worsened with her underlying metastatic lung cancer with pontine hemorrhage causing her increasingly lethargic and somnolent. Palliative care consulted and patient was made DO NOT RESUSCITATE with goal for comfort.  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 Julien Nordmann and receiving concurrent chemoradiation with weekly carboplatin and paclitaxel since 04/04/2015. Completed 4 cycles so far. Planned on repeat CT scan of the chest early next month.  MRI of the brain ordered for symptoms of dizziness and blurred vision but patient refused feeling claustrophobic. Head CT done showed cystic lesion in the pons with associated hemorrhage. -Discussed with radiation oncology Dr Tammi Klippel, who spoke  with neurosurgery. Decadron started as per recommendation.. Also recommends CT chest for restaging which shows decrease in the size of right lower lobe malignancy and mediastinal lymph node.. -Given hemorrhagic brain metastases prognosis seems poor. I started her on Decadron (4 mg every 6 hours) but did not improve her lethargy, weakness and somnolence..Discussed plan patient's husband at bedside who understands patient's poor prognosis and agrees with palliative care discussion for possible hospice. -I spoke with Dr. Julien Nordmann on 12/19 who agrees with palliative approach given her metastatic pontine hemorrhage and poor prognosis. Also spoke with Dr. Lisbeth Renshaw who was covering Dr. Tammi Klippel  and he agrees with the same.  -Patient seen by palliative care team and recommended for full comfort. Husband witnessed to take her to rehabilitation in [place] and try some  physical therapy with the thought that she may be more ambulatory. He  understands her limitations in achieving this. He agrees with having palliative care follow-up at the facility and then transition her to hospice. Use pain medications and benzodiazepines as needed for pain and anxiety. She has not required them during hospital stay.   Acute on chronic hypoxic and hypercapnic respiratory failure Appears to be a combination of CHF exacerbation, underlying lung cancer and uncontrolled hypothyroidism/? Myxedema. Patient maintaining O2 sat on 2 L. (Report being on 2 L at home as well). Diuresed well. Now on oral lasix as needed only. Continue when necessary nebs.   Acute combined systolic and diastolic CHF  Diuresing well with Lasix.Marland Kitchen 2-D echo shows LVH with normal EF of 55-60% no wall motion abnormality..     Uncontrolled hypertension Elevated BP on presentation and home meds resumed. Her pressure medications held due to dizziness and possible orthostasis. I will discharge her on increased dose of Cardizem.  Weakness and lethargy Possibly a combination of pontine hemorrhage and? Myxedema. Symptoms progressively worsened.  Atrial fibrillation Rate controlled. Cardizem held for low BP. Patient has multiple PVCs on monitor. replenished potassium and magnesium. Eliquis discontinued given pontine hemorrhage.   Significant hypothyroidism TSH of 60 on admission. Low free T4 as well.. Patient reports taking her medications. Possible sick euthyroid given acute illness. Continue Synthroid (dose increased to 175 g).   Anxiety/depression Home medications held due to her lethargy.   GERD Continue Carafate and Pepcid.  CKD stage 3  slightly worsened from baseline possibly due to diuresis.   Deconditioning  Seen by PT and recommend SNF.  Pancytopenia Secondary to chemotherapy. Stable.  . Family Communication: Husband at bedside  Disposition Plan: Discharge to [facility] in [place]   Consultants:  [place] with Dr. Julien Nordmann and Dr.  Tammi Klippel  Palliative care  Procedures:  Head CT  2-D echo  CT chest  Antibiotics:  none    Discharge Exam: Filed Vitals:   06/27/15 1856 06/28/15 0540  BP: 134/88 150/102  Pulse: 78 87  Temp:  97.6 F (36.4 C)  Resp:  16     General: Elderly female, somnolent  HEENT: , moist mucosa,   Cardiovascular: S1 and S2 irregularly irregular, no murmurs rub or gallop  Respiratory: improved bibasilar breath sounds, no added sounds, right sided Port-A-Cath  Abdomen: Soft, nondistended, nontender, bowel sounds present  Musculoskeletal: Warm, trace edema, foley  placed on admission  CNS: awake and alert , lethargic and somnolent  Discharge Instructions    Current Discharge Medication List    CONTINUE these medications which have CHANGED   Details  levothyroxine (SYNTHROID, LEVOTHROID) 175 MCG tablet Take 1 tablet (175 mcg total) by mouth daily after supper. Qty: 30 tablet, Refills: 0      CONTINUE these medications which have NOT CHANGED   Details  albuterol (PROVENTIL HFA;VENTOLIN HFA) 108 (90 BASE) MCG/ACT inhaler Inhale 2 puffs into the lungs every 6 (six) hours as needed for wheezing or shortness of breath.    allopurinol (ZYLOPRIM) 300 MG tablet Take 300 mg by mouth daily after supper.     diltiazem (CARDIZEM CD) 240 MG 24 hr capsule Take 1 capsule (240 mg total) by mouth daily. Qty: 90 capsule, Refills: 3    docusate sodium (COLACE) 100 MG capsule Take 100 mg by mouth daily as needed for mild constipation.    furosemide (LASIX) 80 MG tablet Take 40 mg by mouth daily as needed for fluid or  edema (ankle swelling).    latanoprost (XALATAN) 0.005 % ophthalmic solution Place 1 drop into both eyes at bedtime. Refills: 1    lidocaine-prilocaine (EMLA) cream Apply to port 1-2 hours before accessing Qty: 30 g, Refills: 1   Associated Diagnoses: Squamous cell carcinoma of bronchus in right lower lobe (HCC)          omeprazole (PRILOSEC) 20 MG capsule Take  20 mg by mouth daily.     Polyethyl Glycol-Propyl Glycol (SYSTANE OP) Place 1 drop into both eyes at bedtime.    potassium chloride SA (K-DUR,KLOR-CON) 20 MEQ tablet Take 20 mEq by mouth daily.    prochlorperazine (COMPAZINE) 10 MG tablet Take 1 tablet (10 mg total) by mouth every 6 (six) hours as needed for nausea or vomiting. Qty: 30 tablet, Refills: 0    ranitidine (ZANTAC) 150 MG tablet Take 150 mg by mouth daily.     sucralfate (CARAFATE) 1 G tablet Take 1 tablet (1 g total) by mouth 4 (four) times daily. Qty: 120 tablet, Refills: 2           STOP taking these medications     apixaban (ELIQUIS) 5 MG TABS tablet      HYDROcodone-acetaminophen (NORCO/VICODIN) 5-325 MG tablet      lisinopril (PRINIVIL,ZESTRIL) 40 MG tablet      traMADol (ULTRAM) 50 MG tablet      traZODone (DESYREL) 50 MG tablet   LORazepam (ATIVAN) 0.5 MG tablet     Allergies  Allergen Reactions  . Penicillins Hives, Shortness Of Breath and Itching    Has patient had a PCN reaction causing immediate rash, facial/tongue/throat swelling, SOB or lightheadedness with hypotension: Yes Has patient had a PCN reaction causing severe rash involving mucus membranes or skin necrosis: Yes Has patient had a PCN reaction that required hospitalization Yes- went to the dr's Has patient had a PCN reaction occurring within the last 10 years: No If all of the above answers are "NO", then may proceed with Cephalosporin use. Broke out from top of head to feet.   . Tramadol Other (See Comments)    Dizziness, and altered mental statas  . Amiodarone Other (See Comments)    Pt does not remember reaction  . Atorvastatin Hives and Itching  . Bystolic [Nebivolol Hcl] Other (See Comments)    "Drops blood pressure too low"  . Cholestyramine Other (See Comments)    Pt cant remember reaction, "whatever it was, i went straight back to dr and he took me off"  . Clonidine Hydrochloride Other (See Comments)    Pt does not remember  reaction  . Lansoprazole Other (See Comments)    "upset stomach"  . Oxycodone-Acetaminophen Other (See Comments)    "made me so nervous"  . Prevalite [Cholestyramine Light] Other (See Comments)    Pt does not remember reaction  . Propoxyphene Hcl Hives  . Propoxyphene N-Acetaminophen Hives    hives  . Aspirin Palpitations   Follow-up Information    Please follow up.   Why:  MD at SNF       The results of significant diagnostics from this hospitalization (including imaging, microbiology, ancillary and laboratory) are listed below for reference.    Significant Diagnostic Studies: Ct Head Wo Contrast  06/24/2015  CLINICAL DATA:  Dizziness.  History of lung cancer. EXAM: CT HEAD WITHOUT CONTRAST TECHNIQUE: Contiguous axial images were obtained from the base of the skull through the vertex without intravenous contrast. COMPARISON:  Unenhanced MRI head 03/28/2015 FINDINGS: Hypodensity in  the central pons. This was present previously and may be slightly larger but difficult to compare. There is hyperdensity in the posterior aspect of the pons which is most consistent with recent hemorrhage. There is a small amount of hemorrhage in the lesion previously. Generalized atrophy. Chronic microvascular ischemic changes in the white matter. No definite acute infarct. Partially calcified 1 cm lesion in the lateral posterior fossa on the right. This is consistent with meningioma and is unchanged from the prior MRI. Negative for skull lesion. IMPRESSION: Cystic lesion in the pons with associated hemorrhage. This is concerning for metastatic disease with hemorrhage. Unfortunately, the patient has renal insufficiency and intravenous contrast for CT or MRI is not possible at this time. 1 cm calcified meningioma in the right posterior fossa Atrophy and chronic microvascular ischemia. I discussed findings by telephone with Dr. Tammi Klippel Electronically Signed   By: Franchot Gallo M.D.   On: 06/24/2015 11:51   Ct  Chest Wo Contrast  06/25/2015  CLINICAL DATA:  69 year old female with acute shortness of breath and chest pain for 2 days. Known lung cancer. EXAM: CT CHEST WITHOUT CONTRAST TECHNIQUE: Multidetector CT imaging of the chest was performed following the standard protocol without IV contrast. COMPARISON:  03/08/2015 PET-CT. 06/22/2015 and prior chest radiographs. FINDINGS: Mediastinum/Nodes: Cardiomegaly and coronary artery/aortic atherosclerotic calcifications are again noted. Small pericardial effusion is noted. No enlarged lymph nodes are identified. The previously described enlarged mediastinal lymph nodes are now all normal size. Lungs/Pleura: The right lower lobe mass has decreased in size, now measuring 2 x 3.6 cm (image 32), previously 3.8 x 5.7 cm. New bilateral pleural effusions are identified, moderate on the left and small on the right. There is mild central ground-glass opacities which could represent mild interstitial edema. Bibasilar atelectasis is identified, moderate on the left. There is no evidence of pneumothorax. Upper abdomen: No acute abnormalities. Patient is status post cholecystectomy. Musculoskeletal: No acute or suspicious abnormalities. A healing fracture of the anterior right sixth rib is noted. IMPRESSION: New bilateral pleural effusions, moderate on the left and small on the right, and small pericardial effusion. Moderate left basilar atelectasis. Mild central ground-glass opacities may represent mild interstitial edema. Decreased size of known right lower lobe malignancy, now measuring 2 x 3.6 cm, previously 3.8 x 5.7 cm. Decreased size of mediastinal lymph nodes, now normal in size. Cardiomegaly, coronary disease and aortic atherosclerosis. Electronically Signed   By: Margarette Canada M.D.   On: 06/25/2015 09:30   Dg Chest Port 1 View  06/22/2015  CLINICAL DATA:  Respiratory failure, shortness of breath CHF, lung malignancy. EXAM: PORTABLE CHEST 1 VIEW COMPARISON:  Portable chest  x-ray of June 20, 2015 FINDINGS: The right lungs remain borderline hypoinflated. There is persistent left lower lobe atelectasis or pneumonia with small left pleural effusion. The interstitial markings of both lungs slightly more conspicuous today. The cardiac silhouette is enlarged. The pulmonary vascularity is prominent centrally. The Port-A-Cath appliance is unchanged with its tip in the cavoatrial junction. IMPRESSION: Slight increased pulmonary interstitial markings consistent with low-grade interstitial edema. Persistent left lower lobe atelectasis or pneumonia with small left pleural effusion. Electronically Signed   By: David  Martinique M.D.   On: 06/22/2015 07:04   Dg Chest Port 1 View  06/20/2015  CLINICAL DATA:  Acute onset of shortness of breath. Initial encounter. EXAM: PORTABLE CHEST 1 VIEW COMPARISON:  Chest radiograph performed 03/09/2015 FINDINGS: Small bilateral pleural effusions are suspected. Mild left basilar airspace opacity may reflect pneumonia or possibly asymmetric  mild interstitial edema. No pneumothorax is seen. The cardiomediastinal silhouette is borderline enlarged. A right-sided chest port is noted ending about the cavoatrial junction. No acute osseous abnormalities are seen. IMPRESSION: Small bilateral pleural effusions suspected. Mild left basilar airspace opacity may reflect pneumonia or possibly mild asymmetric interstitial edema. Borderline cardiomegaly. Electronically Signed   By: Garald Balding M.D.   On: 06/20/2015 23:10    Microbiology: Recent Results (from the past 240 hour(s))  MRSA PCR Screening     Status: Abnormal   Collection Time: 06/20/15  7:17 PM  Result Value Ref Range Status   MRSA by PCR POSITIVE (A) NEGATIVE Final    Comment:        The GeneXpert MRSA Assay (FDA approved for NASAL specimens only), is one component of a comprehensive MRSA colonization surveillance program. It is not intended to diagnose MRSA infection nor to guide or monitor  treatment for MRSA infections. RESULT CALLED TO, READ BACK BY AND VERIFIED WITH: M.ANION,RN AT 2214 BY L.PITT  06/20/15      Labs: Basic Metabolic Panel:  Recent Labs Lab 06/22/15 0532 06/24/15 1037 06/24/15 1230 06/26/15 0556  NA 140  --  140 137  K 4.0 4.1 4.1 3.9  CL 98*  --  99* 96*  CO2 36*  --  31 32  GLUCOSE 110*  --  102* 145*  BUN 26*  --  25* 38*  CREATININE 2.05*  --  2.24* 2.32*  CALCIUM 9.5  --  9.7 9.9  MG 2.0 2.0  --   --   PHOS 3.2  --   --   --    Liver Function Tests: No results for input(s): AST, ALT, ALKPHOS, BILITOT, PROT, ALBUMIN in the last 168 hours. No results for input(s): LIPASE, AMYLASE in the last 168 hours.  Recent Labs Lab 06/24/15 1037  AMMONIA 13   CBC:  Recent Labs Lab 06/24/15 0530 06/25/15 0551 06/26/15 0556 06/27/15 0435 06/28/15 0400  WBC 3.1* 3.1* 3.9* 5.5 4.0  HGB 10.9* 11.5* 10.4* 10.7* 9.1*  HCT 33.4* 33.6* 31.7* 31.7* 27.2*  MCV 101.2* 98.8 98.4 98.8 98.2  PLT 122* 122* 130* 147* 129*   Cardiac Enzymes: No results for input(s): CKTOTAL, CKMB, CKMBINDEX, TROPONINI in the last 168 hours. BNP: BNP (last 3 results)  Recent Labs  06/21/15 0145  BNP 122.0*    ProBNP (last 3 results) No results for input(s): PROBNP in the last 8760 hours.  CBG: No results for input(s): GLUCAP in the last 168 hours.     SignedLouellen Molder  [facility] 06/28/2015, 7:38 AM

## 2015-06-28 NOTE — Progress Notes (Addendum)
Approximately 1430, called [facility] @ [phone] and spoke to Jackelyn Poling, [place] - RN patient admission coordinator. Gave report and answered all questions. Stated she has left via ambulance - left about 30 minutes ago, husband stated about 2 hour distance. Informed this RN that her husband left about 1 hour ago, will arrive to set up room and with personal belongings.

## 2015-06-28 NOTE — Progress Notes (Signed)
Patient was discharged from hospital to a skilled nursing facility with right chest power port accessed. RN made aware. Tasia Catchings RN [facility]

## 2015-06-28 NOTE — Care Management Important Message (Signed)
Important Message  Patient Details  Name: Kristy [name] MRN: [id number] Date of Birth: 03/10/1946   Medicare Important Message Given:  Yes    Nathen May 06/28/2015, 5:04 PM

## 2015-06-28 NOTE — Progress Notes (Signed)
Ok per MD for d/c today from the hospital. Per family's request- patient will go to [facility] in [place], [place] which is very close to her home.  CSW spoke with husband and notified of cost of EMS transport. He provided [employer] with credit card information to pay for transport. EMS paperwork completed and patient d/c'd.  Nursing notified to call report and CSW met with patient after husband had left to go sign admit papers at facility. Patient stated that she was very glad to be going to rehab and that she is feeling better.  No further CSW needs identified. CSW signing off. Lorie Phenix. Pauline Good, [place]

## 2015-07-07 ENCOUNTER — Other Ambulatory Visit: Payer: Medicare Other

## 2015-07-07 ENCOUNTER — Ambulatory Visit: Admission: RE | Admit: 2015-07-07 | Payer: Medicare Other | Source: Ambulatory Visit | Admitting: Radiation Oncology

## 2015-07-07 ENCOUNTER — Inpatient Hospital Stay
Admission: RE | Admit: 2015-07-07 | Discharge: 2015-07-07 | Disposition: A | Discharge status: Home / Self Care | Payer: Medicare Other | Source: Ambulatory Visit | Attending: Radiation Oncology | Admitting: Radiation Oncology

## 2015-07-07 ENCOUNTER — Ambulatory Visit (HOSPITAL_COMMUNITY): Admission: RE | Admit: 2015-07-07 | Payer: Medicare Other | Source: Ambulatory Visit

## 2015-07-07 ENCOUNTER — Telehealth: Payer: Self-pay | Admitting: Radiation Oncology

## 2015-07-07 NOTE — Telephone Encounter (Signed)
Patient did not show for follow up appointment. Spoke with patient's on. He explains his mother now resides at [facility] in [place], [place]. He explains that she sleeps most days but, family is able to visit her often. He understands to contact this RN with any future needs.

## 2015-07-14 ENCOUNTER — Other Ambulatory Visit: Payer: Medicare Other

## 2015-07-14 ENCOUNTER — Ambulatory Visit: Payer: Medicare Other | Admitting: Internal Medicine

## 2015-07-14 ENCOUNTER — Ambulatory Visit: Payer: Medicare Other

## 2015-07-26 ENCOUNTER — Telehealth: Payer: Self-pay | Admitting: Radiation Oncology

## 2015-07-26 NOTE — Telephone Encounter (Signed)
Kristy [name] reports she received a call from the patient's son, Gene. He reports she passed 07/21/2014. He wanted to expressed his appreciation for the care his mother received at this facility.

## 2015-08-10 DEATH — deceased

## 2016-05-08 IMAGING — MR MR HEAD W/O CM
8 of 10 series · 36 of 48 positions shown · non-contrast
Comparison: PET-CT 03/08/2015

CLINICAL DATA: Newly diagnosed right lower lobe lung cancer.
Staging. Difficulty walking.

EXAM:
MRI HEAD WITHOUT CONTRAST
TECHNIQUE: Multiplanar, multiecho pulse sequences of the brain and surrounding
structures were obtained without intravenous contrast.

[Series 3: T1 · sagittal · 5.0mm · 0.47mm/px · 1 of 24 slices shown]
[im 1/24]
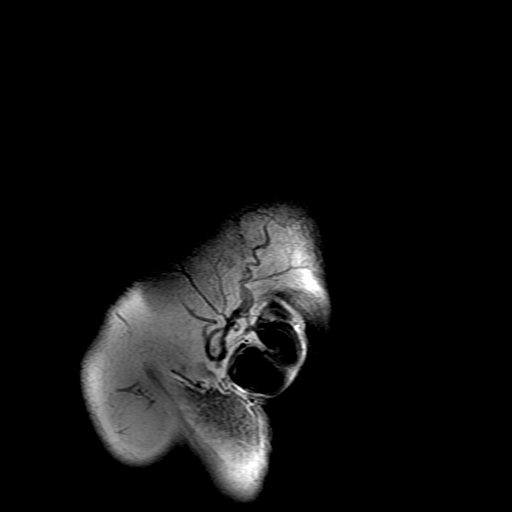

[Series 4: DWI · axial · 3.0mm · 1.09mm/px · z∈[-41,+103]mm · 8 of 98 slices shown (1 of 4)]
[im 1/98]
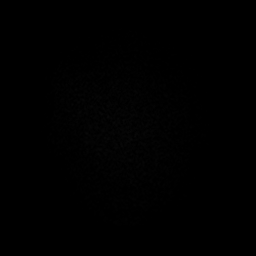
[im 11/98]
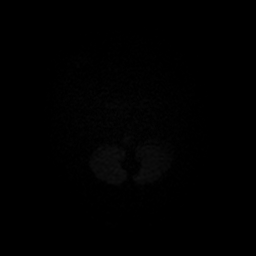
[im 33/98]
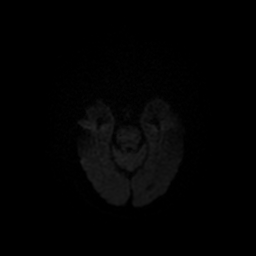
[im 44/98]
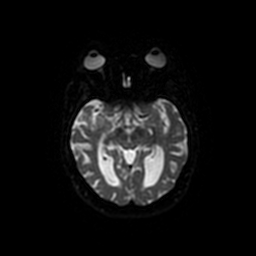
[im 54/98]
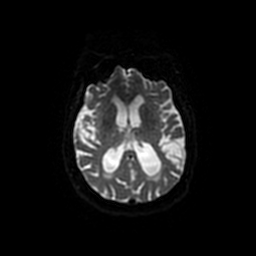
[im 65/98]
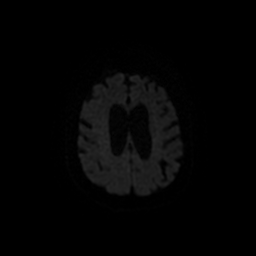
[im 87/98]
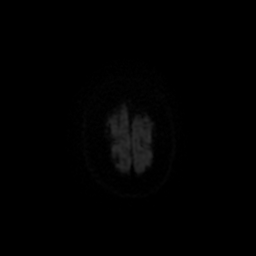
[im 98/98]
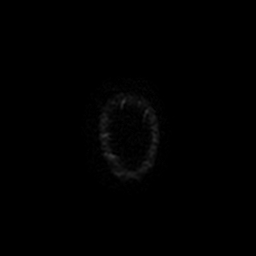

[Series 5: DWI · coronal · 5.0mm · 1.09mm/px · 8 of 70 slices shown (2 of 4)]
[im 1/70]
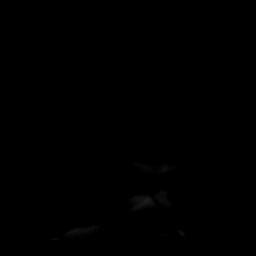
[im 10/70]
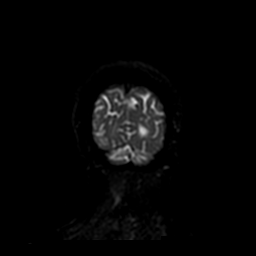
[im 20/70]
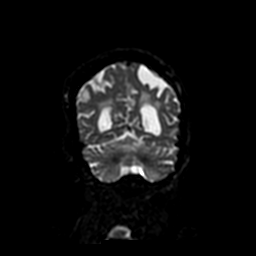
[im 30/70]
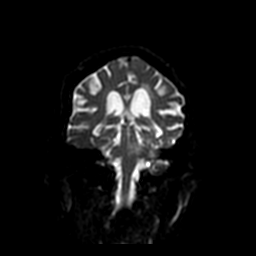
[im 40/70]
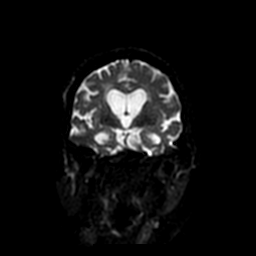
[im 50/70]
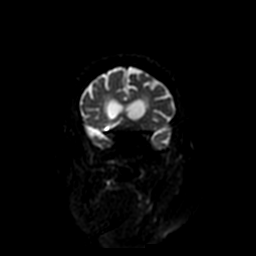
[im 60/70]
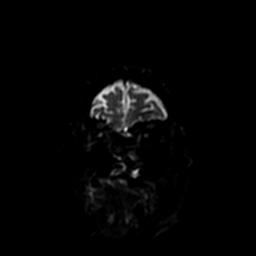
[im 70/70]
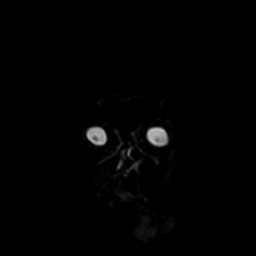

[Series 6: T2 · axial · 5.0mm · 0.43mm/px · z∈[-33,+116]mm · 3 of 24 slices shown (1 of 2)]
[im 1/24]
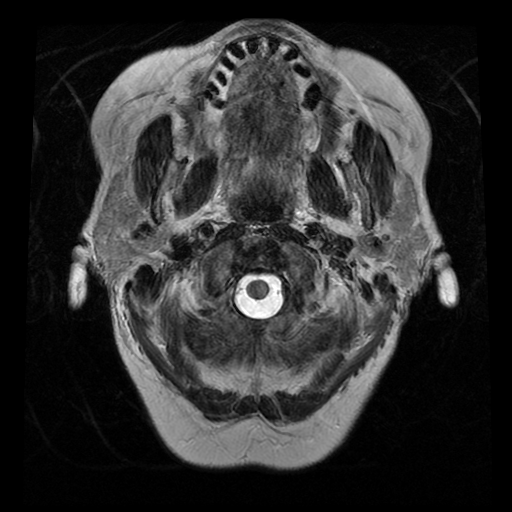
[im 12/24]
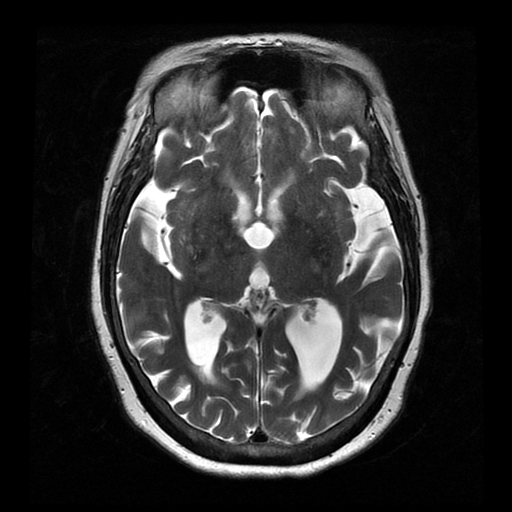
[im 24/24]
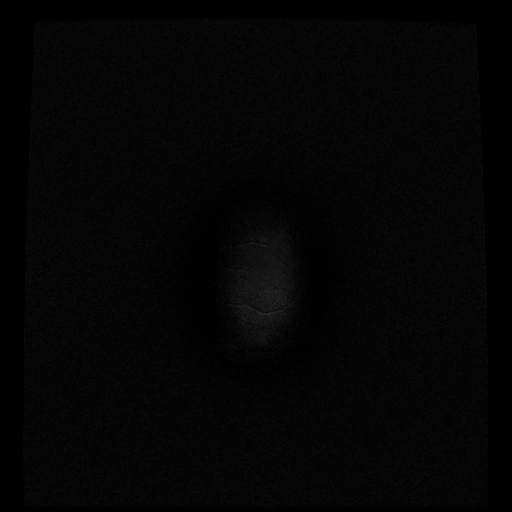

[Series 7: FLAIR · axial · 5.0mm · 0.43mm/px · z∈[-38,+122]mm · 3 of 24 slices shown]
[im 1/24]
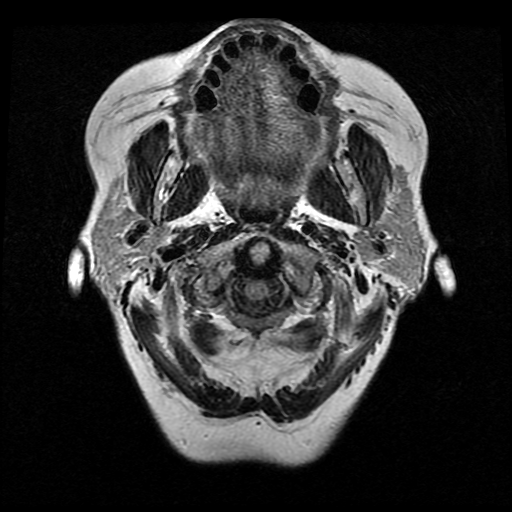
[im 12/24]
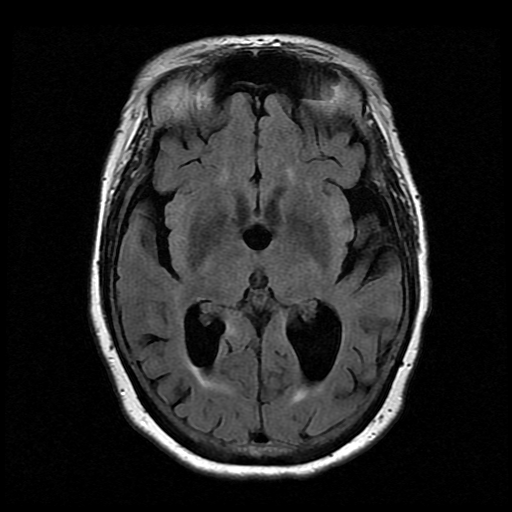
[im 24/24]
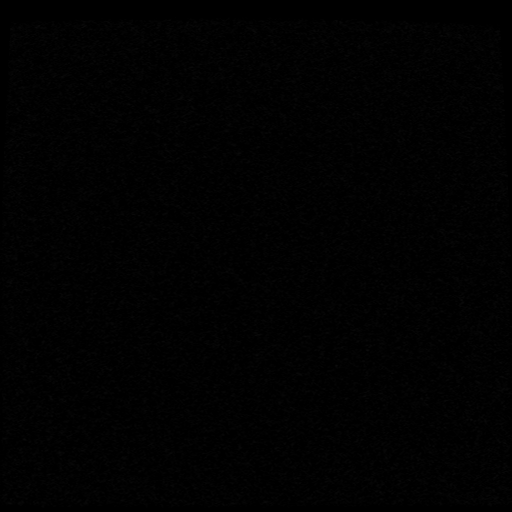

[Series 10: T2 · coronal · 5.0mm · 0.45mm/px · 3 of 27 slices shown (2 of 2)]
[im 1/27]
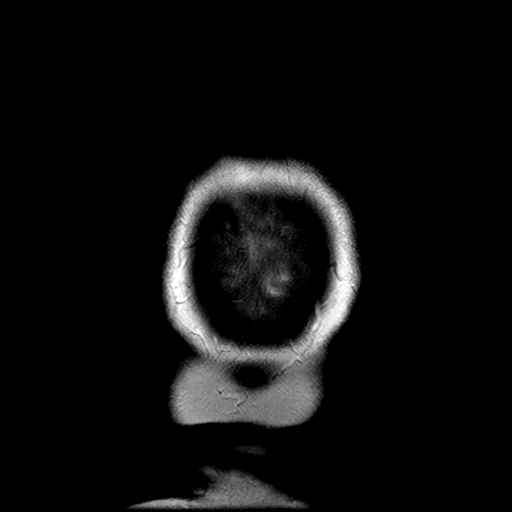
[im 14/27]
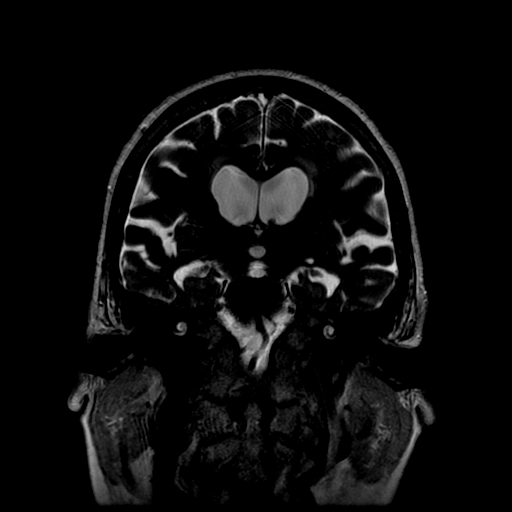
[im 27/27]
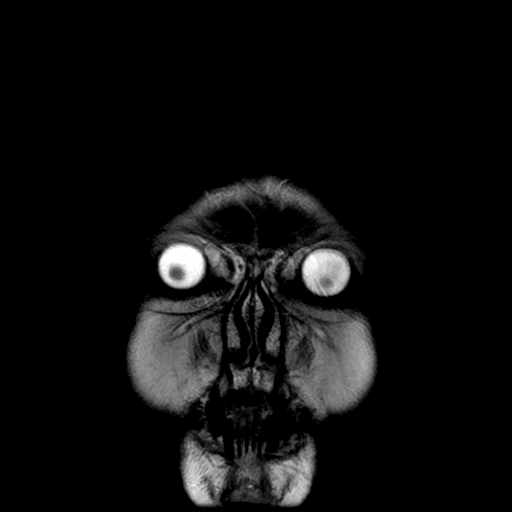

[Series 400: DWI · axial · 3.0mm · 1.09mm/px · z∈[-41,+103]mm · 6 of 49 slices shown (3 of 4)]
[im 1/49]
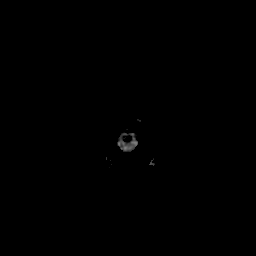
[im 10/49]
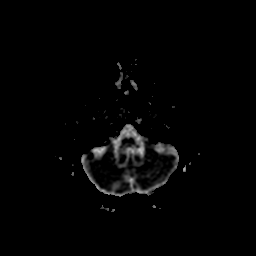
[im 20/49]
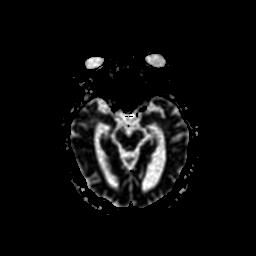
[im 29/49]
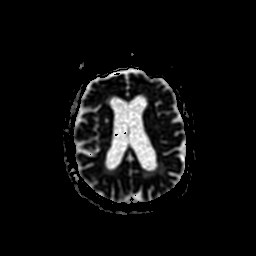
[im 39/49]
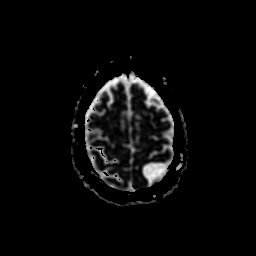
[im 49/49]
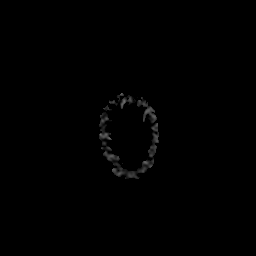

[Series 500: DWI · coronal · 5.0mm · 1.09mm/px · 4 of 35 slices shown (4 of 4)]
[im 1/35]
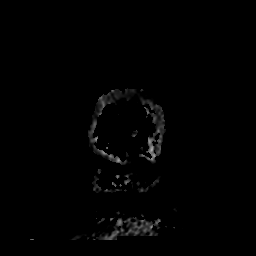
[im 12/35]
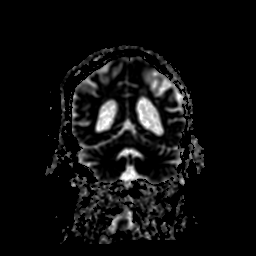
[im 23/35]
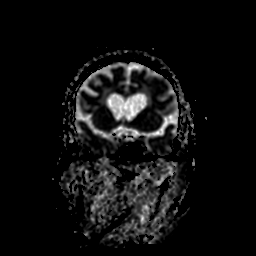
[im 35/35]
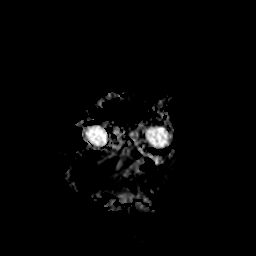

[36 of 48 positions shown; findings below may reference images not displayed]

FINDINGS: IV contrast could not be administered due to diminished renal
function, and this limits sensitivity for lesion detection.

There is no evidence of acute infarct, mass, midline shift, or
extra-axial fluid collection. Scattered, chronic microhemorrhages
are noted in both cerebral hemispheres and both thalami. There is
moderate cerebral atrophy. Patchy T2 hyperintensities involving the
deep greater than subcortical cerebral white matter bilaterally are
nonspecific but compatible with moderate chronic small vessel
ischemic disease. A chronic lacunar infarct is noted in the right
corona radiata.

There is a 9 mm T2 hypointense extra-axial lesion in the right
lateral posterior fossa which is calcified when correlating with
prior PET-CT. There is a 5 mm T2 hyperintense focus with associated
susceptibility artifact in the left pons which is most consistent
with an old hemorrhagic infarct with mild surrounding gliosis,
without grossly abnormal uptake identified in this region on the
recent PET-CT. There is also a small focus of likely post ischemic
gliosis in the medial right cerebellum.

Orbits are unremarkable. No significant inflammatory disease is seen
in the paranasal sinuses or mastoid air cells. Major intracranial
vascular flow voids are preserved.
IMPRESSION: 1. No definite evidence of intracranial metastases, however please
note that sensitivity for detection of small lesions is reduced by
the lack of IV contrast.
2. Signal abnormality in the left pons is felt to be most consistent
with a chronic hemorrhagic infarct and unlikely to reflect a
metastasis.
3. 9 mm extra-axial lesion in the right posterior fossa, most
consistent with a meningioma.
4. Moderate chronic small vessel ischemic disease in the cerebral
white matter.

## 2016-07-03 ENCOUNTER — Other Ambulatory Visit: Payer: Self-pay | Admitting: Nurse Practitioner
